# Patient Record
Sex: Female | Born: 1980
Health system: Southern US, Community
[De-identification: ages and names within clinical notes are randomized; demographics above are authoritative.]

## PROBLEM LIST (undated history)

## (undated) DIAGNOSIS — L409 Psoriasis, unspecified: Secondary | ICD-10-CM

## (undated) DIAGNOSIS — Z808 Family history of malignant neoplasm of other organs or systems: Secondary | ICD-10-CM

## (undated) DIAGNOSIS — F329 Major depressive disorder, single episode, unspecified: Secondary | ICD-10-CM

## (undated) DIAGNOSIS — J45909 Unspecified asthma, uncomplicated: Secondary | ICD-10-CM

## (undated) DIAGNOSIS — D649 Anemia, unspecified: Secondary | ICD-10-CM

## (undated) DIAGNOSIS — Z973 Presence of spectacles and contact lenses: Secondary | ICD-10-CM

## (undated) DIAGNOSIS — M5430 Sciatica, unspecified side: Secondary | ICD-10-CM

## (undated) DIAGNOSIS — F909 Attention-deficit hyperactivity disorder, unspecified type: Secondary | ICD-10-CM

## (undated) DIAGNOSIS — Z8041 Family history of malignant neoplasm of ovary: Secondary | ICD-10-CM

## (undated) DIAGNOSIS — Z8669 Personal history of other diseases of the nervous system and sense organs: Secondary | ICD-10-CM

## (undated) DIAGNOSIS — K279 Peptic ulcer, site unspecified, unspecified as acute or chronic, without hemorrhage or perforation: Secondary | ICD-10-CM

## (undated) DIAGNOSIS — C539 Malignant neoplasm of cervix uteri, unspecified: Secondary | ICD-10-CM

## (undated) DIAGNOSIS — E559 Vitamin D deficiency, unspecified: Secondary | ICD-10-CM

## (undated) DIAGNOSIS — Z862 Personal history of diseases of the blood and blood-forming organs and certain disorders involving the immune mechanism: Secondary | ICD-10-CM

## (undated) DIAGNOSIS — Z8711 Personal history of peptic ulcer disease: Secondary | ICD-10-CM

## (undated) DIAGNOSIS — Z800 Family history of malignant neoplasm of digestive organs: Secondary | ICD-10-CM

## (undated) DIAGNOSIS — F32A Depression, unspecified: Secondary | ICD-10-CM

## (undated) DIAGNOSIS — Z8049 Family history of malignant neoplasm of other genital organs: Secondary | ICD-10-CM

## (undated) DIAGNOSIS — Z8042 Family history of malignant neoplasm of prostate: Secondary | ICD-10-CM

## (undated) DIAGNOSIS — Z803 Family history of malignant neoplasm of breast: Secondary | ICD-10-CM

## (undated) DIAGNOSIS — R51 Headache: Secondary | ICD-10-CM

## (undated) HISTORY — DX: Family history of malignant neoplasm of breast: Z80.3

## (undated) HISTORY — DX: Family history of malignant neoplasm of ovary: Z80.41

## (undated) HISTORY — PX: NO PAST SURGERIES: SHX2092

## (undated) HISTORY — DX: Family history of malignant neoplasm of other organs or systems: Z80.8

## (undated) HISTORY — DX: Headache: R51

## (undated) HISTORY — DX: Major depressive disorder, single episode, unspecified: F32.9

## (undated) HISTORY — DX: Depression, unspecified: F32.A

## (undated) HISTORY — DX: Family history of malignant neoplasm of prostate: Z80.42

## (undated) HISTORY — DX: Attention-deficit hyperactivity disorder, unspecified type: F90.9

## (undated) HISTORY — DX: Family history of malignant neoplasm of other genital organs: Z80.49

## (undated) HISTORY — DX: Peptic ulcer, site unspecified, unspecified as acute or chronic, without hemorrhage or perforation: K27.9

## (undated) HISTORY — DX: Anemia, unspecified: D64.9

## (undated) HISTORY — DX: Family history of malignant neoplasm of digestive organs: Z80.0

---
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
## 2001-01-20 ENCOUNTER — Encounter: Payer: Self-pay | Admitting: Obstetrics & Gynecology

## 2001-01-20 ENCOUNTER — Inpatient Hospital Stay (HOSPITAL_COMMUNITY): Admission: AD | Admit: 2001-01-20 | Discharge: 2001-01-20 | Payer: Self-pay | Admitting: Obstetrics & Gynecology

## 2001-02-05 ENCOUNTER — Inpatient Hospital Stay (HOSPITAL_COMMUNITY): Admission: AD | Admit: 2001-02-05 | Discharge: 2001-02-05 | Payer: Self-pay | Admitting: Obstetrics and Gynecology

## 2001-03-01 ENCOUNTER — Other Ambulatory Visit: Admission: RE | Admit: 2001-03-01 | Discharge: 2001-03-01 | Payer: Self-pay | Admitting: Obstetrics and Gynecology

## 2001-05-31 ENCOUNTER — Inpatient Hospital Stay (HOSPITAL_COMMUNITY): Admission: AD | Admit: 2001-05-31 | Discharge: 2001-05-31 | Payer: Self-pay | Admitting: Obstetrics and Gynecology

## 2001-06-15 ENCOUNTER — Inpatient Hospital Stay (HOSPITAL_COMMUNITY): Admission: AD | Admit: 2001-06-15 | Discharge: 2001-06-15 | Payer: Self-pay | Admitting: Obstetrics and Gynecology

## 2001-06-20 ENCOUNTER — Inpatient Hospital Stay (HOSPITAL_COMMUNITY): Admission: AD | Admit: 2001-06-20 | Discharge: 2001-06-20 | Payer: Self-pay | Admitting: Obstetrics and Gynecology

## 2001-08-02 ENCOUNTER — Inpatient Hospital Stay (HOSPITAL_COMMUNITY): Admission: AD | Admit: 2001-08-02 | Discharge: 2001-08-02 | Payer: Self-pay | Admitting: Obstetrics and Gynecology

## 2001-08-30 ENCOUNTER — Observation Stay (HOSPITAL_COMMUNITY): Admission: AD | Admit: 2001-08-30 | Discharge: 2001-08-30 | Payer: Self-pay | Admitting: Obstetrics and Gynecology

## 2001-08-30 ENCOUNTER — Encounter: Payer: Self-pay | Admitting: Obstetrics and Gynecology

## 2001-09-05 ENCOUNTER — Inpatient Hospital Stay (HOSPITAL_COMMUNITY): Admission: AD | Admit: 2001-09-05 | Discharge: 2001-09-07 | Payer: Self-pay | Admitting: Obstetrics and Gynecology

## 2003-05-09 ENCOUNTER — Other Ambulatory Visit: Admission: RE | Admit: 2003-05-09 | Discharge: 2003-05-09 | Payer: Self-pay | Admitting: Obstetrics and Gynecology

## 2003-11-02 ENCOUNTER — Emergency Department (HOSPITAL_COMMUNITY): Admission: EM | Admit: 2003-11-02 | Discharge: 2003-11-02 | Payer: Self-pay | Admitting: Family Medicine

## 2004-03-20 ENCOUNTER — Other Ambulatory Visit: Admission: RE | Admit: 2004-03-20 | Discharge: 2004-03-20 | Payer: Self-pay | Admitting: Emergency Medicine

## 2004-09-01 ENCOUNTER — Encounter: Admission: RE | Admit: 2004-09-01 | Discharge: 2004-09-01 | Payer: Self-pay | Admitting: Emergency Medicine

## 2004-09-06 ENCOUNTER — Emergency Department (HOSPITAL_COMMUNITY): Admission: EM | Admit: 2004-09-06 | Discharge: 2004-09-06 | Payer: Self-pay | Admitting: Emergency Medicine

## 2004-09-12 ENCOUNTER — Encounter: Admission: RE | Admit: 2004-09-12 | Discharge: 2004-09-12 | Payer: Self-pay | Admitting: Emergency Medicine

## 2005-10-16 ENCOUNTER — Encounter: Admission: RE | Admit: 2005-10-16 | Discharge: 2005-10-16 | Payer: Self-pay | Admitting: Gastroenterology

## 2005-11-15 ENCOUNTER — Encounter: Admission: RE | Admit: 2005-11-15 | Discharge: 2005-11-15 | Payer: Self-pay | Admitting: Emergency Medicine

## 2006-05-07 ENCOUNTER — Encounter: Admission: RE | Admit: 2006-05-07 | Discharge: 2006-05-07 | Payer: Self-pay | Admitting: Family Medicine

## 2006-10-26 ENCOUNTER — Ambulatory Visit: Payer: Self-pay | Admitting: Family Medicine

## 2006-10-26 DIAGNOSIS — D649 Anemia, unspecified: Secondary | ICD-10-CM | POA: Insufficient documentation

## 2006-10-26 DIAGNOSIS — F329 Major depressive disorder, single episode, unspecified: Secondary | ICD-10-CM

## 2006-10-26 DIAGNOSIS — R51 Headache: Secondary | ICD-10-CM

## 2006-10-26 DIAGNOSIS — K1370 Unspecified lesions of oral mucosa: Secondary | ICD-10-CM

## 2006-10-26 DIAGNOSIS — K279 Peptic ulcer, site unspecified, unspecified as acute or chronic, without hemorrhage or perforation: Secondary | ICD-10-CM | POA: Insufficient documentation

## 2006-10-26 DIAGNOSIS — J45909 Unspecified asthma, uncomplicated: Secondary | ICD-10-CM | POA: Insufficient documentation

## 2006-10-26 DIAGNOSIS — F32A Depression, unspecified: Secondary | ICD-10-CM | POA: Insufficient documentation

## 2006-10-28 ENCOUNTER — Encounter: Payer: Self-pay | Admitting: Family Medicine

## 2006-11-04 ENCOUNTER — Emergency Department (HOSPITAL_COMMUNITY): Admission: EM | Admit: 2006-11-04 | Discharge: 2006-11-04 | Payer: Self-pay | Admitting: Emergency Medicine

## 2006-12-24 ENCOUNTER — Ambulatory Visit: Payer: Self-pay | Admitting: Family Medicine

## 2006-12-24 DIAGNOSIS — G4700 Insomnia, unspecified: Secondary | ICD-10-CM | POA: Insufficient documentation

## 2006-12-24 DIAGNOSIS — N915 Oligomenorrhea, unspecified: Secondary | ICD-10-CM

## 2006-12-24 LAB — CONVERTED CEMR LAB: Beta hcg, urine, semiquantitative: NEGATIVE

## 2007-01-21 ENCOUNTER — Telehealth: Payer: Self-pay | Admitting: Family Medicine

## 2007-03-04 ENCOUNTER — Ambulatory Visit: Payer: Self-pay | Admitting: Family Medicine

## 2007-03-04 DIAGNOSIS — L989 Disorder of the skin and subcutaneous tissue, unspecified: Secondary | ICD-10-CM

## 2007-03-23 ENCOUNTER — Telehealth (INDEPENDENT_AMBULATORY_CARE_PROVIDER_SITE_OTHER): Payer: Self-pay | Admitting: *Deleted

## 2007-03-24 ENCOUNTER — Telehealth (INDEPENDENT_AMBULATORY_CARE_PROVIDER_SITE_OTHER): Payer: Self-pay | Admitting: *Deleted

## 2007-09-27 ENCOUNTER — Telehealth: Payer: Self-pay | Admitting: Family Medicine

## 2007-10-28 ENCOUNTER — Ambulatory Visit: Payer: Self-pay | Admitting: Family Medicine

## 2007-10-28 DIAGNOSIS — B070 Plantar wart: Secondary | ICD-10-CM | POA: Insufficient documentation

## 2007-11-01 ENCOUNTER — Inpatient Hospital Stay (HOSPITAL_COMMUNITY): Admission: AD | Admit: 2007-11-01 | Discharge: 2007-11-02 | Payer: Self-pay | Admitting: Obstetrics and Gynecology

## 2007-12-17 ENCOUNTER — Inpatient Hospital Stay (HOSPITAL_COMMUNITY): Admission: AD | Admit: 2007-12-17 | Discharge: 2007-12-19 | Payer: Self-pay | Admitting: Obstetrics and Gynecology

## 2008-03-06 IMAGING — CR DG SMALL BOWEL
4 series · 4 of 4 positions shown · non-contrast
Comparison: none

CLINICAL DATA: Nausea, diarrhea

SMALL BOWEL SERIES:
TECHNIQUE: Following ingestion of barium, serial small bowel radiographs were
obtained, including spot views of the terminal ileum.

[view not recorded (1 of 4)]
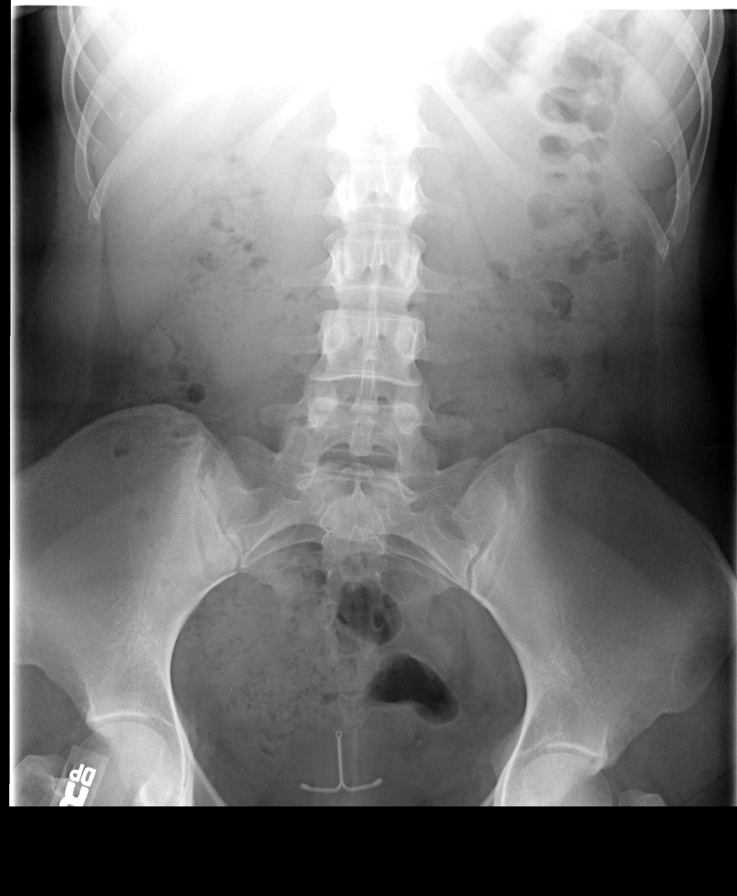

[view not recorded (2 of 4)]
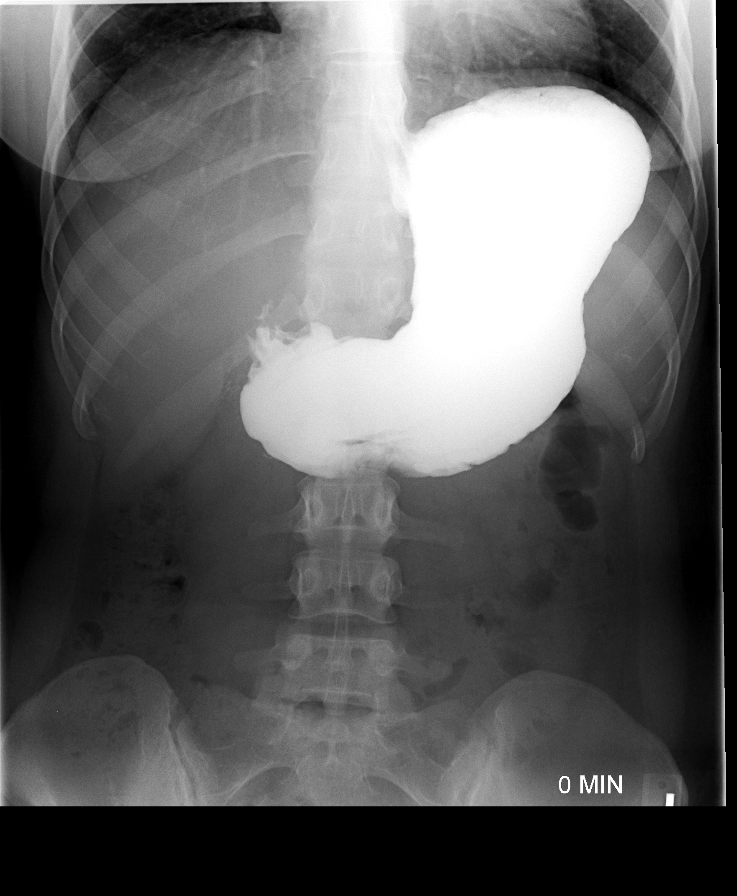

[view not recorded (3 of 4)]
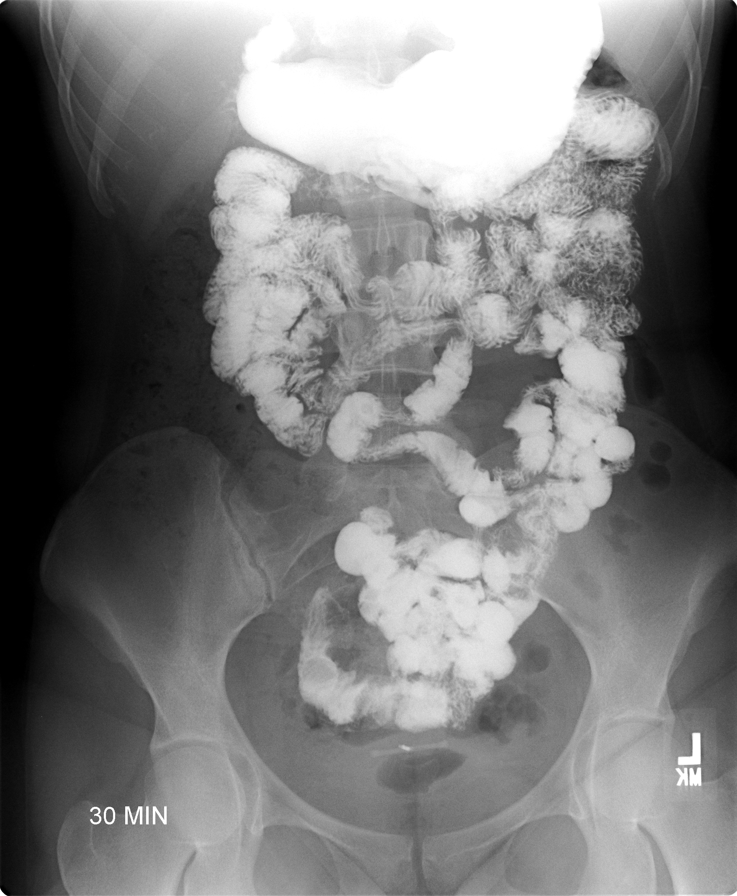

[view not recorded (4 of 4)]
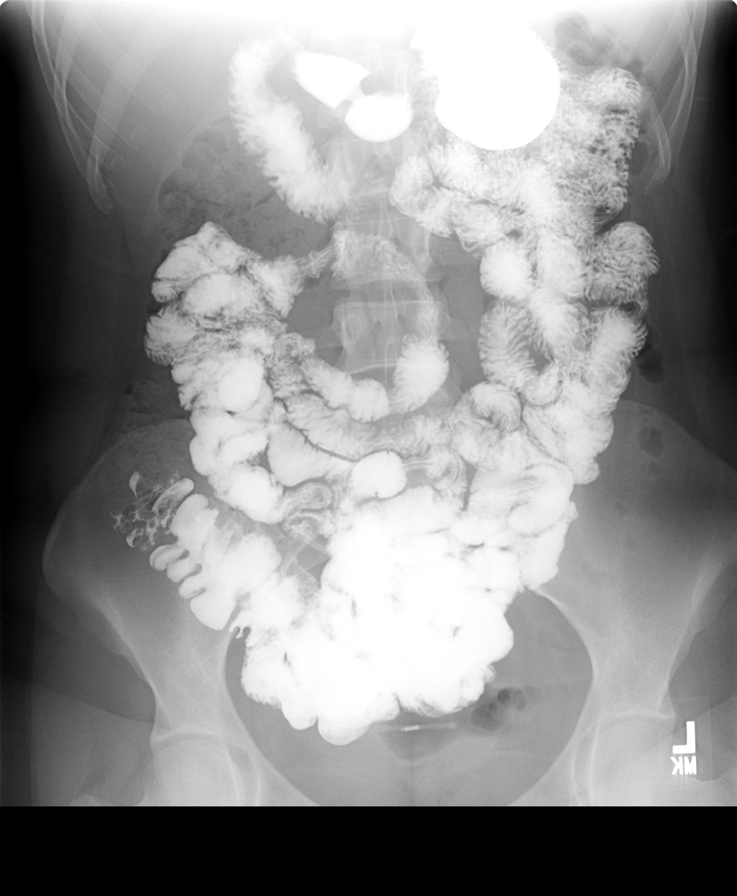

[4 of 4 positions shown; findings below may reference images not displayed]

FINDINGS: Images of the stomach, duodenal bulb, and duodenal sweep are
unremarkable. There is normal transit time to the colon of 1 hour. Small bowel
loops are normal caliber and unremarkable. Terminal ileum normal.
IMPRESSION: Unremarkable small bowel follow-through.

## 2008-04-25 ENCOUNTER — Ambulatory Visit: Payer: Self-pay | Admitting: Family Medicine

## 2008-04-25 DIAGNOSIS — R071 Chest pain on breathing: Secondary | ICD-10-CM | POA: Insufficient documentation

## 2008-04-25 LAB — CONVERTED CEMR LAB
Bilirubin Urine: NEGATIVE
Blood in Urine, dipstick: NEGATIVE
Ketones, urine, test strip: NEGATIVE
Specific Gravity, Urine: 1.02

## 2008-05-10 ENCOUNTER — Telehealth: Payer: Self-pay | Admitting: Family Medicine

## 2008-05-16 ENCOUNTER — Encounter: Payer: Self-pay | Admitting: Family Medicine

## 2008-11-21 ENCOUNTER — Ambulatory Visit: Payer: Self-pay | Admitting: Family Medicine

## 2008-11-21 DIAGNOSIS — R002 Palpitations: Secondary | ICD-10-CM

## 2008-11-23 LAB — CONVERTED CEMR LAB
BUN: 16 mg/dL (ref 6–23)
Creatinine, Ser: 0.7 mg/dL (ref 0.4–1.2)
GFR calc non Af Amer: 106.29 mL/min (ref >60)
Sodium: 141 meq/L (ref 135–145)
TSH: 1.66 microintl units/mL (ref 0.35–5.50)

## 2009-04-16 ENCOUNTER — Ambulatory Visit: Payer: Self-pay | Admitting: Family Medicine

## 2009-04-16 DIAGNOSIS — G43909 Migraine, unspecified, not intractable, without status migrainosus: Secondary | ICD-10-CM | POA: Insufficient documentation

## 2009-04-20 ENCOUNTER — Emergency Department (HOSPITAL_BASED_OUTPATIENT_CLINIC_OR_DEPARTMENT_OTHER): Admission: EM | Admit: 2009-04-20 | Discharge: 2009-04-20 | Payer: Self-pay | Admitting: Emergency Medicine

## 2009-05-01 ENCOUNTER — Ambulatory Visit: Payer: Self-pay | Admitting: Family Medicine

## 2010-02-21 ENCOUNTER — Telehealth: Payer: Self-pay | Admitting: Family Medicine

## 2010-03-23 ENCOUNTER — Encounter: Payer: Self-pay | Admitting: Emergency Medicine

## 2010-04-01 NOTE — Assessment & Plan Note (Signed)
Summary: fu on migraines/new rx/njr   Vital Signs:  Patient profile:   30 year old female Height:      63 inches Weight:      179 pounds BMI:     31.82 Temp:     98.3 degrees F oral BP sitting:   122 / 66  (left arm) Cuff size:   regular  Vitals Entered By: Alfred Levins, CMA (April 16, 2009 10:12 AM) CC: migraine   History of Present Illness: here for 3 days of worsening migraine HAs. Has tried Imitrex and Motrin at home with no relief. Her husband drove her here today. She has blurred vision, throbbing frontal HAs, nausea and vomitting, and light sensitivity. She had used Topamax with great success a few years ago, but came off this with her pregnancy. The HAs went away for awhile but now they are back with increasing frequency.   Current Medications (verified): 1)  Ambien Cr 12.5 Mg Tbcr (Zolpidem Tartrate) .Marland Kitchen.. 1 By Mouth At Bedtime As Needed 2)  Imitrex 100 Mg  Tabs (Sumatriptan Succinate) .... As Needed Ha 3)  Womens One Daily  Tabs (Multiple Vitamins-Minerals) .... Once Daily 4)  Fish Oil 1000 Mg Caps (Omega-3 Fatty Acids) .... Once Daily  Allergies (verified): No Known Drug Allergies  Past History:  Past Medical History: Reviewed history from 10/26/2006 and no changes required. Anemia-NOS Asthma Depression Headache Peptic ulcer disease  Past Surgical History: Reviewed history from 10/26/2006 and no changes required. Denies surgical history  Review of Systems  The patient denies anorexia, fever, weight loss, weight gain, vision loss, decreased hearing, hoarseness, chest pain, syncope, dyspnea on exertion, peripheral edema, prolonged cough, hemoptysis, abdominal pain, melena, hematochezia, severe indigestion/heartburn, hematuria, incontinence, genital sores, muscle weakness, suspicious skin lesions, transient blindness, difficulty walking, depression, unusual weight change, abnormal bleeding, enlarged lymph nodes, angioedema, breast masses, and testicular masses.     Physical Exam  General:  Well-developed,well-nourished,in no acute distress; alert,appropriate and cooperative throughout examination Head:  Normocephalic and atraumatic without obvious abnormalities. No apparent alopecia or balding. Eyes:  No corneal or conjunctival inflammation noted. EOMI. Perrla. Funduscopic exam benign, without hemorrhages, exudates or papilledema. Vision grossly normal. Very photophobic Ears:  External ear exam shows no significant lesions or deformities.  Otoscopic examination reveals clear canals, tympanic membranes are intact bilaterally without bulging, retraction, inflammation or discharge. Hearing is grossly normal bilaterally. Nose:  External nasal examination shows no deformity or inflammation. Nasal mucosa are pink and moist without lesions or exudates. Mouth:  Oral mucosa and oropharynx without lesions or exudates.  Teeth in good repair. Neck:  No deformities, masses, or tenderness noted. Neurologic:  No cranial nerve deficits noted. Station and gait are normal. Plantar reflexes are down-going bilaterally. DTRs are symmetrical throughout. Sensory, motor and coordinative functions appear intact.   Impression & Recommendations:  Problem # 1:  MIGRAINE HEADACHE (ICD-346.90)  Her updated medication list for this problem includes:    Imitrex 100 Mg Tabs (Sumatriptan succinate) .Marland Kitchen... As needed ha  Orders: Promethazine up to 50mg  (J2550) Admin of Therapeutic Inj  intramuscular or subcutaneous (64403) Demerol  100mg   Injection (K7425)  Complete Medication List: 1)  Ambien Cr 12.5 Mg Tbcr (Zolpidem tartrate) .Marland Kitchen.. 1 by mouth at bedtime as needed 2)  Imitrex 100 Mg Tabs (Sumatriptan succinate) .... As needed ha 3)  Womens One Daily Tabs (Multiple vitamins-minerals) .... Once daily 4)  Fish Oil 1000 Mg Caps (Omega-3 fatty acids) .... Once daily 5)  Promethazine Hcl 25 Mg Tabs (  Promethazine hcl) .Marland Kitchen.. 1 q 4 hours as needed nausea 6)  Topamax 50 Mg Tabs  (Topiramate) .... Once daily  Patient Instructions: 1)  Get back on Topamax for prevention.  2)  Please schedule a follow-up appointment in 2 weeks.  Prescriptions: TOPAMAX 50 MG TABS (TOPIRAMATE) once daily  #30 x 2   Entered and Authorized by:   Nelwyn Salisbury MD   Signed by:   Nelwyn Salisbury MD on 04/16/2009   Method used:   [facility]  [place] [address] [facility]       [address]       [place], [place]  [address]       Ph: [phone] or [phone]       Fax: [phone]   RxID:   [id number] PROMETHAZINE HCL 25 MG TABS (PROMETHAZINE HCL) 1 q 4 hours as needed nausea  #60 x 5   Entered and Authorized by:   Nelwyn Salisbury MD   Signed by:   Nelwyn Salisbury MD on 04/16/2009   Method used:   [facility]        [facility]  [place] [address] [facility]       [address]       [place], [place]  [address]       Ph: [phone] or [phone]       Fax: [phone]   RxID:   [id number] IMITREX 100 MG  TABS (SUMATRIPTAN SUCCINATE) as needed HA  #12 x 11   Entered and Authorized by:   Nelwyn Salisbury MD   Signed by:   Nelwyn Salisbury MD on 04/16/2009   Method used:   [facility]  [place] [address]       [address]       [place], [place]  [address]       Ph: [phone] or [phone]       Fax: [phone]   RxID:   [id number]    Medication Administration  Injection # 1:    Medication: Promethazine up to 50mg     Diagnosis: MIGRAINE HEADACHE (ICD-346.90)    Route: IM    Site: LUOQ gluteus    Exp Date: 07/2010    Lot #: [id number]    Mfr: [employer]plus    Patient tolerated injection without complications    Given by: Alfred Levins, CMA (April 16, 2009 12:50 PM)  Injection # 2:    Medication: Demerol  100mg   Injection    Diagnosis: MIGRAINE HEADACHE (ICD-346.90)    Route: IM    Site: LUOQ gluteus    Exp Date: 05/01/2010    Lot #: [id number]    Mfr: [employer]    Comments: 50mg  given    Patient tolerated injection without  complications    Given by: Alfred Levins, CMA (April 17, 2009 10:28 AM)  Orders Added: 1)  Est. Patient Level IV [52778] 2)  Promethazine up to 50mg  [J2550] 3)  Admin of Therapeutic Inj  intramuscular or subcutaneous [96372] 4)  Demerol  100mg   Injection [J2175]

## 2010-04-03 NOTE — Progress Notes (Signed)
Summary: Pt req refill of Ambien CR [facility] [place]  Phone Note Refill Request Call back at Home Phone [phone] Message from:  Patient on February 21, 2010 10:11 AM  Refills Requested: Medication #1:  AMBIEN CR 12.5 MG TBCR 1 by mouth at bedtime as needed   Dosage confirmed as above?Dosage Confirmed   Supply Requested: 1 month Pt req 1 month supply with refills. [facility] [address] [facility] [place]. Pt completely out of med.    Method Requested: Telephone to Pharmacy Initial call taken by: Lucy Antigua,  February 21, 2010 10:11 AM  Follow-up for Phone Call        ok #30. further refills from pmd Follow-up by: Edwyna Perfect MD,  February 21, 2010 10:45 AM  Additional Follow-up for Phone Call Additional follow up Details #1::        done pt called  Additional Follow-up by: Pura Spice, RN,  February 21, 2010 10:50 AM    New/Updated Medications: AMBIEN CR 12.5 MG TBCR (ZOLPIDEM TARTRATE) 1 by mouth at bedtime as needed Prescriptions: AMBIEN CR 12.5 MG TBCR (ZOLPIDEM TARTRATE) 1 by mouth at bedtime as needed  #30 x 0   Entered by:   Pura Spice, RN   Authorized by:   Edwyna Perfect MD   Signed by:   Pura Spice, RN on 02/21/2010   Method used:   Telephoned to ...       [facility]  [place] [address] ([facility]ail[facility]       [address]       [place], [place]  [address]       Ph: [phone] or [phone]       Fax: [phone]   RxID:   [id number]

## 2010-04-10 ENCOUNTER — Encounter: Payer: Self-pay | Admitting: Family Medicine

## 2010-04-15 ENCOUNTER — Ambulatory Visit: Payer: Self-pay | Admitting: Family Medicine

## 2010-04-15 DIAGNOSIS — Z0289 Encounter for other administrative examinations: Secondary | ICD-10-CM

## 2010-05-22 LAB — CBC
HCT: 40.1 % (ref 36.0–46.0)
MCHC: 34.5 g/dL (ref 30.0–36.0)
MCV: 93.2 fL (ref 78.0–100.0)
Platelets: 141 10*3/uL — ABNORMAL LOW (ref 150–400)
RBC: 4.3 MIL/uL (ref 3.87–5.11)
RDW: 13 % (ref 11.5–15.5)
WBC: 6.1 10*3/uL (ref 4.0–10.5)

## 2010-05-22 LAB — BASIC METABOLIC PANEL
BUN: 14 mg/dL (ref 6–23)
Calcium: 9 mg/dL (ref 8.4–10.5)
GFR calc non Af Amer: >60 mL/min (ref >60)

## 2010-05-22 LAB — DIFFERENTIAL
Basophils Absolute: 0 10*3/uL (ref 0.0–0.1)
Lymphs Abs: 0.6 10*3/uL — ABNORMAL LOW (ref 0.7–4.0)
Monocytes Absolute: 0.6 10*3/uL (ref 0.1–1.0)
Monocytes Relative: 9 % (ref 3–12)
Neutrophils Relative %: 79 % — ABNORMAL HIGH (ref 43–77)

## 2010-07-18 NOTE — H&P (Signed)
[facility]  Patient:    Alison Cervantes, Alison Cervantes Visit Number: [id number] MRN: [id number]          Service Type: OBS Location: 910B 9160 01 Attending Physician:  Esmeralda Arthur Dictated by:   Wynelle Bourgeois, CNM Admit Date:  09/05/2001                           History and Physical  BRIEF HISTORY:  This is a 30 year old G2, para 1, 0-0-1 at 41 weeks who presents with complaints of increased uterine contractions since 8 a.m. with leaking clear fluids since then.  She reports positive fetal movement.  The pregnancy has followed by the nurse midwife service unremarkable for: 1. Abnormal LMP. 2. Rh-negative. 3. History of depression. 4. Positive group B streptococcus last pregnancy, negative this pregnancy.  OBSTETRICAL HISTORY:  Remarkable for spontaneous vaginal delivery in October of 2000, of a female infant at [redacted] weeks gestation weighing 7 pounds 14 ounces with no complications.  PAST MEDICAL HISTORY: 1. Remarkable for a history of anemia treated with iron. 2. Occasional yeast infections 3. History of Rh-negative blood type. 4. History of positive group B streptococcus last pregnancy. 5. A history of depression for which she uses Prozac. 6. A history of pyelonephritis x2.  PAST SURGICAL HISTORY:  Unremarkable.  FAMILY HISTORY:  Remarkable for maternal grandmother with emphysema.  Paternal grandfather with adult onset diabetes mellitus and a maternal grandmother with adult onset diabetes mellitus.  Maternal grandmother with lung cancer, paternal grandfather with prostate cancer.  Mother with manic depression.  GENETIC HISTORY:  Remarkable for the father of the babys sister who has congenital heart disease, probably a septal defect. The same sister also has deafness in 1 ear with a speech impairment.  SOCIAL HISTORY:  The patient is single but involved with Rosalia Hammers who is involved and supportive. She is of the [facility].  She  denies any alcohol, tobacco or drug use.  PRENATAL LABORATORY DATA:  Hemoglobin 12.4, platelets 168, blood type AB negative, antibody screen negative.  RPR nonreactive.  Rubella immune. HBsAg negative, HIV nonreactive.  Gonorrhea negative.  Chlamydia negative.  Group B streptococcus negative.  AFP free beta within normal limits.  Glucose challenge within normal limits.  PHYSICAL EXAMINATION:  VITAL SIGNS:  Stable, afebrile.  HEENT:  Within normal limits.  NECK:  Thyroid normal.  Not enlarged.  BREASTS:  Soft, nontender.  No masses.  CHEST:  Clear to auscultation bilaterally.  HEART:  Heart regular rate and rhythm.  ABDOMEN:  Gravid at 40 cm.  Vertex to [place].  VFM shows reassuring fetal heart rate with positive accelerations, average variability, and occasional small variable decelerations with contractions.  Uterine contractions every 1-1/2 to 2 minutes.  Cervical examination 4-5 cm, 90% effaced, 0 station, vertex presentation.  Bulging forebag.  Examination positive for pooling and nitrocine and clear fluid.  EXTREMITIES:  Within normal limits.  ASSESSMENT: 1. Intrauterine pregnancy at 42 and seventh weeks. 2. Active labor. 3. Spontaneous rupture of membranes.  PLAN: 1. Admit to birthing suite, Dr. Estanislado Pandy notified. 2. Routine CNM orders. 3. Epidural p.r.n. 4. Anticipate spontaneous vaginal delivery. Dictated by:   Wynelle Bourgeois, CNM Attending Physician:  Esmeralda Arthur DD:  09/05/01 TD:  09/05/01 Job: [id number]

## 2010-07-18 NOTE — H&P (Signed)
[facility]  Patient:    Alison Cervantes, Alison Cervantes Visit Number: [id number] MRN: [id number]          Service Type: OBS Location: 910B 9151 01 Attending Physician:  Shaune Spittle Dictated by:   Nigel Bridgeman, C.N.M. Admit Date:  08/30/2001                           History and Physical  HISTORY OF PRESENT ILLNESS:   The patient is a 30 year old gravida 2, para 1-0-0-1 at 12 weeks, who presented status post maternal fall at approximately 7:50 a.m. this morning with fall on her abdomen, left elbow and knee; she also struck her forehead on the wall as she fell and slipped on carpet running for the telephone.  She denies any vaginal bleeding and leaking.  She reports positive fetal movements.  She does describe occasional cramping but she reports this is no different from what she was having before the fall occurred.  She does complain of slight dysuria but denies any fever or back pain.  Pregnancy is remarkable for:  #1 - Rh-negative, with the patient receiving RhoGAM at 28 weeks, #2 - history of depression, #3 - questionable LMP, #4 - history of mild thrombocytopenia with platelets of 145,000 on August 09, 2001, with a nadir of 129,000 in April.  PRENATAL LABORATORY DATA:     Blood type is AB-negative, Rh-antibody negative. VDRL nonreactive.  Rubella titer positive.  Hepatitis B surface antigen negative.  HIV nonreactive.  GC and Chlamydia cultures were negative.  Pap was normal.  Glucose challenge was normal.  AFP was normal.  Hemoglobin upon entry into practice was 12.4; it was 11 at 27 weeks.  Group B strep culture was negative at 36 weeks.  Platelet count at new OB was 168,000; it was 138,000 at 19 weeks, it was 129,000 at 28 weeks and 145,000 at 36 weeks.  EDC of September 04, 2001 was established by ultrasound at approximately 19 weeks.  HISTORY OF PRESENT PREGNANCY:  The patient entered care at approximately 11-1/2 weeks.  She had an ultrasound at  19 weeks for dating purposes.  Her platelet count was noted to be slightly low when she was evaluated in Maternity Admissions at 23 weeks.  These were repeated; they went down to a nadir of 129,000 at 28 weeks and then returned to 145,000 at 36 weeks.  She had another ultrasound at 29 weeks that showed normal growth and development. There was left renal pyelectasis noted.  She had a GI virus at 35 weeks.  The rest of her pregnancy was essentially uncomplicated.  OBSTETRICAL HISTORY:          She had vaginal birth of a female infant, weight 7 pounds 14 ounces, at [redacted] weeks gestation.  She was in labor 20 hours.  She had epidural anesthesia.  She was on extra iron during that pregnancy.  She also received RhoGAM and was positive for beta strep.  MEDICAL HISTORY:              She was on Ortho Tri-Cyclen until six months prior to pregnancy; she has also used Depo-Provera.  She has had UTIs three to four times.  She has had pyelonephritis twice.  She has a history of depression.  Hospitalizations include kidney infection in the past.  She was hospitalized at [facility] for three to four months when she was 30 years old for depression; she has taken  Prozac in the past.  ALLERGIES:                    She has no known medication allergies.  FAMILY HISTORY:               Her maternal grandmother had emphysema, insulin-dependent diabetes and lung cancer.  Her paternal grandfather had adult-onset diabetes and prostate cancer.  Her mother has manic depression. Her maternal grandfather also had adult-onset diabetes.  Her father is an alcohol user.  GENETIC HISTORY:              Genetic history is remarkable for the father of the babys sister having a hole in the heart, being deaf in one ear and having a speech impediment.  SOCIAL HISTORY:               The patient is single.  The father of the baby is involved and supportive; his name is Rosalia Hammers.  The patient  is high-school-educated; she is employed with [employer] as a Psychologist, educational.  Her husband has two years of college; he is employed as a Nutritional therapist.  She has been followed by the certified nurse midwife service at [facility].  She denies any alcohol, drug or tobacco use during this pregnancy.  PHYSICAL EXAMINATION:  VITAL SIGNS:                  Vital signs are stable.  The patient is afebrile.  HEENT:                        Remarkable for a small area of abrasion on her mid-forehead; there is also one small area -- approximately grape-sized -- of swelling where she struck her head.  LUNGS:                        Bilateral breath sounds are clear.  HEART:                        Regular rate and rhythm without murmur.  BREASTS:                      Soft and nontender.  ABDOMEN:                      Fundal height is approximately 37 to 38 cm. Estimated fetal weight is 7 to 7-1/2 pounds.  Uterine contractions are very irregular and mild.  PELVIC:                       Cervical exam is deferred; the patient has been 2 cm in the office.   EXTREMITIES:                  Deep tendon reflexes are 2+ without clonus. There is a trace edema noted.  There is a nickel-sized abrasion on the left elbow and a 2- to 3-cm abrasion on her left knee.  LABORATORY AND ACCESSORY DATA:  Fetal heart rate is reactive and has remained such over the last several hours.  CBC is within normal limits with hemoglobin 11.3, hematocrit of 33.7, platelet count of 143,000 and white blood cell count of 9.6.  Kleihauer-Betke is negative.  Clean-catch urine shows specific gravity of 1.025, moderate leukocyte esterase, many epithelials, 11-20 wbcs and many bacteria.  IMPRESSION: 1. Intrauterine pregnancy  at 39 weeks. 2. Status post maternal abdominal trauma. 3. Rh-negative. 4. Urinary tract infection.  PLAN: 1. Admit to the hospital for 23-hour observation with a consideration of    possible discharge later  this evening per consult with    Dr. Maris Berger. [place]. 2. Continuous electronic fetal monitoring.  3. OB ultrasound to check the placenta and BPP. 4. Urine to culture. 5. Will prescribe Macrobid or formulary equivalent. 6. Check antibody screen with RhoGAM to be given if antibody screen is    negative but no RhoGAM is needed if antibody screen is positive. Dictated by:   Nigel Bridgeman, C.N.M. Attending Physician:  Shaune Spittle DD:  08/30/01 TD:  08/30/01 Job: [id number]

## 2010-07-18 NOTE — Discharge Summary (Signed)
[facility]  Patient:    Alison Cervantes, Alison Cervantes Visit Number: [id number] MRN: [id number]          Service Type: OBS Location: 910B 9151 01 Attending Physician:  Shaune Spittle Dictated by:   Nigel Bridgeman, C.N.M. Admit Date:  08/30/2001 Discharge Date: 08/30/2001                             Discharge Summary  ADMITTING DIAGNOSES: 1. Intrauterine pregnancy at 39 weeks. 2. Status post maternal abdominal trauma. 3. Rh-negative. 4. Urinary tract infection.  DISCHARGE DIAGNOSES: 1. Intrauterine pregnancy at 39 weeks. 2. Status post maternal abdominal trauma. 3. Rh-negative. 4. Urinary tract infection.  PROCEDURES: 1. Continuous electronic fetal monitoring. 2. Obstetrical ultrasound.  HOSPITAL COURSE:              The patient is a 30 year old gravida 2, para 1-0-0-1, at 32 weeks, who was evaluated today, status post a maternal fall. Pregnancy had been remarkable for:  #1 - Rh-negative, #2 - history of depression, #3 - questionable LMP, #4 - history of mild thrombocytopenia.  She was monitored for almost 10 hours; she had a reactive fetal heart rate tracing throughout.  She had very occasional mild contractions which she reported were no different than what she was having prior to the fall.  She had a limited OB ultrasound that showed the fetus in a vertex presentation with normal fluid and a biophysical profile of 8/8.  She had a CBC done that showed a hemoglobin of 11.3, hematocrit of 33.7, platelet count of 143,000, which was stable from her last evaluation on August 09, 2001.  Her Kleihauer-Betke was negative. Clean-catch UA showed specific gravity of 1.025, moderate leukocyte esterase, many epithelials, 11-20 wbcs and many bacteria; this was sent for culture. The patient was given one dose of Macrobid.  She also had an antibody screen that was negative, therefore, she was given a full dose of RhoGAM to cover. She had a small abrasion on  her left elbow and a moderate abrasion on her left knee; she also had a small swollen area on her forehead with two small skin breaks there.  She had a normal neurological exam.  Her abdomen was soft and nontender.  After the prolonged monitoring, ultrasound, RhoGAM administration and other labs, she was deemed to be stable and was able to be discharged home per consult with Dr. Maris Berger. Haygood.  Cervix was posterior, 2 cm, 75%, vertex at a -1 station, which represented no change from her previous exam in the office; the patient was therefore discharged home.  DISCHARGE INSTRUCTIONS:       The patient is to observe for signs and symptoms of labor.  Precautions, status post maternal abdominal trauma, were also reviewed with the patient; instructed to call with any bleeding, severe abdominal pain or decrease in fetal movement.  DISCHARGE MEDICATIONS: 1. Tylenol one to two p.o. q.3-4h. p.r.n. pain. 2. Macrobid one p.o. b.i.d. -- this will be called into the pharmacy tomorrow    for a seven-day course.  FOLLOWUP:                     Discharge followup will occur on Thursday, on September 01, 2001, as scheduled.  SPECIAL DISCHARGE INSTRUCTIONS:  She will not need RhoGAM at the time of her delivery secondary to receiving a full dose on todays date. Dictated by:   Nigel Bridgeman, C.N.M. Attending  Physician:  Shaune Spittle DD:  08/30/01 TD:  09/01/01 Job: [id number]

## 2010-07-18 NOTE — H&P (Signed)
[facility]  Patient:    Alison, Cervantes Visit Number: [id number] MRN: [id number]          Service Type: OBS Location: [id number] Attending Physician:  Shaune Spittle Dictated by:   Nigel Bridgeman, C.N.M. Admit Date:  08/30/2001                           History and Physical  INCOMPLETE  HISTORY OF PRESENT ILLNESS:   Ms. Alison Cervantes is a 30 year old gravida 2, para 1-0-0-1, at 90 weeks, who presented status post a paternal fall this morning at approximately 7:50 a.m. with a fall on her abdomen, left elbow, and knee. She also struck her forehead on the wall.  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  She denies any vaginal bleeding or leaking.  She reports positive fetal movement.  She describes occasional cramping but reports this is no different than before the fall.  She does complain of slight dysuria. Pregnancy is remarkable for:  (1) Rh negative with patient receiving RhoGAM at approximately 28 weeks.  (2) History of depression.  (3) Questionable last menstrual period.  (4) History of mild thrombocytopenia with platelets of 145 on August 09, 2001.  PRENATAL LABORATORY DATA:     Blood type is AB negative, Rh antibody negative. VDRL nonreactive.  Rubella titer positive.  Hepatitis B surface antigen negative.  HIV nonreactive.  GC and Chlamydia cultures were negative.  Pap was normal.  Glucose challenge was normal.  AFP was normal.  The patient had a negative beta strep report.  Platelets at her initial new OB visit in December were 168.  They were repeated in April, they were 129.  On August 09, 2001, they were 145.  EDC of September 04, 2001, was established by ultrasound at approximately 18-19 weeks.  She had another ultrasound at 29 weeks that showed normal growth and fluid.  Group B strep culture was negative at 36 weeks.  Hemoglobin upon entering the practice was 12.4.  It was 11 at 27 weeks.  HISTORY OF PRESENT PREGNANCY:  The patient  entered care at approximately 11-1/2 weeks.  She had an upper respiratory infection in the early part of pregnancy.  She had an ultrasound at 19 weeks.  Her AFP was normal.  Her platelets were 168 at her new OB.  They were 138 in April.  They were 129 later that month, and they were 141 at 33 weeks, 145 at 36 weeks.  OBSTETRICAL HISTORY:          In 2000 the patient had a vaginal birth of a female infant, weight 7 pounds 14 ounces at [redacted] weeks gestation.  She was in labor 20 hours.  She had epidural anesthesia.  She had no other complications. She was on extra iron during her pregnancy.  She is AB negative and did receive RhoGAM.  She did have positive beta strep in her last pregnancy.  PAST MEDICAL HISTORY:         She was an Ortho Tri-Cyclen user until six months prior to pregnancy.  She was also a paropsis Depo-Provera user.  She had a history of UTI in the past.  She has had pyelonephritis twice.  She had a history of depression, history of previously-noted kidney infection, which required hospitalization.  She was hospitalized at [facility] in three to four months _____ for depression.  She was on Prozac since that time and was  on it just prior to pregnancy.  ALLERGIES:                    She has no known medication allergies.  FAMILY HISTORY:               Her maternal grandmother had emphysema and is now deceased.  Her paternal grandfather has adult onset diabetes.  Paternal grandmother had insulin-dependent diabetes and is now deceased.  The maternal grandmother had lung cancer.  The maternal grandfather had prostate and colon cancer.  Her mother has a history of manic depression, and her father is an alcohol user.  GENETIC HISTORY:              Remarkable for the father of the babys sister having a hole in the heart, deaf in one ear, and a speech impediment.  SOCIAL HISTORY:               The patient is single.  The father of the baby is involved and supportive.  His  name is Claudina Lick.  The patient is white.  She is of the [employer].  She has a high school education.  She is employed as an Estate manager/land agent at [employer].  The patients partner has two years of trade school.  He is employed as a Nutritional therapist.  She has been followed by the certified nurse midwife service at [facility]. She denies any alcohol, drug, or tobacco use during this pregnancy.  PHYSICAL EXAMINATION:  VITAL SIGNS:                  Stable.  The patient is afebrile.  HEENT:                        Within normal limits.  The patient does have a small swollen area approximately the size of a grape on her right forehead. She also has two small skin splits in that area.  She reports this is where she struck the wall.  CHEST:                        Breath sounds are clear.  CARDIAC:                      Regular rate and rhythm without murmur.  BREASTS:                      Soft, nontender.  ABDOMEN:                      Fundal height is approximately 38 cm.  Estimated fetal weight is 7 to 7-1/2 pounds.  Uterine contractions are very irregular and mild.  Cervical exam is deferred at present.  She has been 2 cm in the office.  Fetal heart rate is reactive over a two to three-hour monitoring period initially.  Abdomen is soft and nontender.  EXTREMITIES:                  She has an approximately nickel-sized abrasion on her left elbow and a 2-3 cm abrasion on her left knee.  NEUROLOGIC:                   Pupils are equally reactive to light.  There is no difficulty with grip strength.  She has full range of motion in  all her extremities.  LABORATORY DATA:              CBC shows hemoglobin of 11.3, hematocrit of 33.7, platelet count of 143, and WBC count of 9.6.  Clean-catch urine shows specific gravity of 1.025, moderate leukocyte esterase, many epithelialis, 11-20 wbcs, and many bacteria.  _____ is negative.  IMPRESSION:                   1.  Intrauterine pregnancy at 39 weeks.                               2. Status post paternal abdominal trauma.                                3. Rh negative.                               4. Urinary tract infection.  PLAN:                         1. Admit to the [facility] for 23-hour                                  observation, but discharge may be considered                                  this evening per consult with Dr. Pennie Rushing.                               2. Continuous electronic fetal monitoring.                               3. OB ultrasound to check the placenta and the                                  biophysical profile.                               4. Urine to culture.                               5. Will prescribe Macrobid or formulary                                  equivalent.                               6. Check antibody screen per consult with Dr.                                  Pennie Rushing.  Would not need RhoGAM if her  antibody screen Dictated by:   Nigel Bridgeman, C.N.M. Attending Physician:  Shaune Spittle DD:  08/30/01 TD:  08/30/01 Job: [id number]

## 2010-11-28 ENCOUNTER — Ambulatory Visit (INDEPENDENT_AMBULATORY_CARE_PROVIDER_SITE_OTHER): Payer: 59 | Admitting: Family Medicine

## 2010-11-28 ENCOUNTER — Encounter: Payer: Self-pay | Admitting: Family Medicine

## 2010-11-28 VITALS — BP 108/70 | HR 64 | Temp 99.0°F | Wt 154.0 lb

## 2010-11-28 DIAGNOSIS — F909 Attention-deficit hyperactivity disorder, unspecified type: Secondary | ICD-10-CM

## 2010-11-28 MED ORDER — ATOMOXETINE HCL 60 MG PO CAPS: 60.0000 mg | ORAL_CAPSULE | Freq: Every day | ORAL | Status: DC

## 2010-11-28 NOTE — Progress Notes (Signed)
  Subjective:    Patient ID: Alison Cervantes, female    DOB: [birth year], 30 y.o.   MRN: [id number]  HPI Here to discuss problems of focusing on tasks and getting things done on time. For the past years she has had difficulty focusing on tasks and completing them. 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 She often feels overwhelmed by what she needs to do, and this has affected her job and her personal life. She feels happy, and she denies any depression symptoms. She sleeps well. Appetite is good, and she works out 3 days a week.    Review of Systems  Constitutional: Negative.   Neurological: Negative.   Psychiatric/Behavioral: Negative.        Objective:   Physical Exam  Constitutional: She is oriented to person, place, and time. She appears well-developed and well-nourished.  Neurological: She is alert and oriented to person, place, and time. No cranial nerve deficit. She exhibits normal muscle tone. Coordination normal.  Psychiatric: She has a normal mood and affect. Her behavior is normal. Judgment and thought content normal.          Assessment & Plan:  She is showing signs of ADHD. Try Strattera daily. Recheck in one month

## 2010-12-01 LAB — CBC
HCT: 28.6 — ABNORMAL LOW
Hemoglobin: 11 — ABNORMAL LOW
MCHC: 33.7
Platelets: 145 — ABNORMAL LOW
Platelets: 192
RBC: 3.62 — ABNORMAL LOW
RDW: 13.7

## 2010-12-01 LAB — RPR: RPR Ser Ql: NONREACTIVE

## 2010-12-03 LAB — FETAL FIBRONECTIN: Fetal Fibronectin: NEGATIVE

## 2011-01-06 ENCOUNTER — Telehealth: Payer: Self-pay | Admitting: Family Medicine

## 2011-01-06 NOTE — Telephone Encounter (Signed)
Pt called and said that the Strattera is not working, she feels worse now than before and that you had discussed possibly trying Adderall or Vyvanse. Can she try either of those?

## 2011-01-07 MED ORDER — AMPHETAMINE-DEXTROAMPHET ER 20 MG PO CP24: 20.0000 mg | ORAL_CAPSULE | ORAL | Status: DC

## 2011-01-07 NOTE — Telephone Encounter (Signed)
Wrote for one month of Adderall XR. Have her see me in a few weeks

## 2011-01-07 NOTE — Telephone Encounter (Signed)
Script is ready for pick up and I left a voice message with below information. 

## 2011-02-06 ENCOUNTER — Telehealth: Payer: Self-pay | Admitting: Family Medicine

## 2011-02-06 MED ORDER — AMPHETAMINE-DEXTROAMPHET ER 30 MG PO CP24: 30.0000 mg | ORAL_CAPSULE | ORAL | Status: DC

## 2011-02-06 NOTE — Telephone Encounter (Signed)
Pt called and sch an ov for 02/13/11 for med check and refill ov. Pt said that she feels like the dosage for her Adderall XR 20mg , needs to be increased. Pt is going to run out of med in 3 days. Pt req either a new script with increased dosage, but enough of the 20mg s to last until pt comes in to her ov.

## 2011-02-06 NOTE — Telephone Encounter (Signed)
Pt aware and will pick up rx on Monday.

## 2011-02-06 NOTE — Telephone Encounter (Signed)
Try Adderall XR 30 mg a day. Keep the appt for next week

## 2011-02-13 ENCOUNTER — Ambulatory Visit: Payer: 59 | Admitting: Family Medicine

## 2011-02-13 DIAGNOSIS — Z0289 Encounter for other administrative examinations: Secondary | ICD-10-CM

## 2011-02-27 ENCOUNTER — Telehealth: Payer: Self-pay | Admitting: Family Medicine

## 2011-02-27 NOTE — Telephone Encounter (Signed)
Pt has cough/chest congestion for 1 wk. Pt has been taking otc nyquil but it hasn't help. Pt req ov or med to be called in to [facility].

## 2011-02-27 NOTE — Telephone Encounter (Signed)
Spoke with pt and she is going to call and try to get a Saturday appointment at [facility].

## 2011-03-16 ENCOUNTER — Ambulatory Visit (INDEPENDENT_AMBULATORY_CARE_PROVIDER_SITE_OTHER): Payer: 59 | Admitting: Family Medicine

## 2011-03-16 ENCOUNTER — Encounter: Payer: Self-pay | Admitting: Family Medicine

## 2011-03-16 VITALS — BP 110/68 | HR 78 | Temp 98.7°F | Wt 151.0 lb

## 2011-03-16 DIAGNOSIS — F909 Attention-deficit hyperactivity disorder, unspecified type: Secondary | ICD-10-CM

## 2011-03-16 DIAGNOSIS — M545 Low back pain, unspecified: Secondary | ICD-10-CM

## 2011-03-16 DIAGNOSIS — J3489 Other specified disorders of nose and nasal sinuses: Secondary | ICD-10-CM

## 2011-03-16 MED ORDER — AMPHETAMINE-DEXTROAMPHET ER 30 MG PO CP24: 30.0000 mg | ORAL_CAPSULE | ORAL | Status: DC

## 2011-03-16 MED ORDER — CYCLOBENZAPRINE HCL 10 MG PO TABS: 10.0000 mg | ORAL_TABLET | Freq: Three times a day (TID) | ORAL | Status: AC | PRN

## 2011-03-16 NOTE — Progress Notes (Signed)
  Subjective:    Patient ID: Alison Cervantes, female    DOB: [birth year], 31 y.o.   MRN: [id number]  HPI Here to follow up on ADHD, also for other issues. 2 weeks ago she had the sudden onset of sharp lower back pain with pains radiating down the backs of both legs. No numbness or weakness. She had been seeing a chiropractor for years for low back problems, but she has not seen him for several months. Motrin and one of her friend's muscle relaxers stopped the pain. Today the pain is gone. Also, she had several brief spells of a clear drainage for the right nostril. No URI or allergy symptoms. After researching this on the internet, she is wooried that this could be a spontaneous leak of CSF. No HA or neurologic deficits. No recent trauma.    Review of Systems  Constitutional: Negative.   HENT: Positive for rhinorrhea. Negative for nosebleeds, congestion, facial swelling, sneezing, neck pain and postnasal drip.   Eyes: Negative.   Respiratory: Negative.   Musculoskeletal: Positive for back pain.  Neurological: Negative.        Objective:   Physical Exam  Constitutional: She is oriented to person, place, and time. She appears well-developed and well-nourished.  HENT:  Head: Normocephalic and atraumatic.  Right Ear: External ear normal.  Left Ear: External ear normal.  Mouth/Throat: Oropharynx is clear and moist. No oropharyngeal exudate.  Eyes: Conjunctivae and EOM are normal. Pupils are equal, round, and reactive to light.  Musculoskeletal:       Tender in the lower back with no spasm and full ROM   Neurological: She is alert and oriented to person, place, and time. She has normal reflexes. No cranial nerve deficit. She exhibits normal muscle tone. Coordination normal.          Assessment & Plan:  Her ADHD is well controlled so we refilled the Adderall for 3 months. The nasal drainage is probably benign. A spontaneous CSF leak is exceedingly rare. She will return if this starts  again. As for the back pain, use Flexeril and heat and Motrin prn. She will start seeing her chiropractor again.

## 2011-03-19 ENCOUNTER — Telehealth: Payer: Self-pay | Admitting: Family Medicine

## 2011-03-19 DIAGNOSIS — J3489 Other specified disorders of nose and nasal sinuses: Secondary | ICD-10-CM

## 2011-03-19 NOTE — Telephone Encounter (Signed)
Pt aware of this. 

## 2011-03-19 NOTE — Telephone Encounter (Signed)
Pt was seen on 03-16-2011. Pt has developed metallic taste in mouth and tingling/numbness in lips,tongue. Pt would like to be referral to ent or neurlogist . ?spontaneous CSF

## 2011-03-19 NOTE — Telephone Encounter (Signed)
Tell her I did a referral to ENT and Camelia Eng will be calling the details

## 2011-03-25 ENCOUNTER — Other Ambulatory Visit: Payer: Self-pay | Admitting: Otolaryngology

## 2011-03-25 DIAGNOSIS — J329 Chronic sinusitis, unspecified: Secondary | ICD-10-CM

## 2011-03-27 ENCOUNTER — Ambulatory Visit
Admission: RE | Admit: 2011-03-27 | Discharge: 2011-03-27 | Disposition: A | Discharge status: Home / Self Care | Payer: 59 | Source: Ambulatory Visit | Attending: Otolaryngology | Admitting: Otolaryngology

## 2011-03-27 DIAGNOSIS — J329 Chronic sinusitis, unspecified: Secondary | ICD-10-CM

## 2011-07-02 ENCOUNTER — Ambulatory Visit (INDEPENDENT_AMBULATORY_CARE_PROVIDER_SITE_OTHER): Payer: 59 | Admitting: Family Medicine

## 2011-07-02 ENCOUNTER — Encounter: Payer: Self-pay | Admitting: Family Medicine

## 2011-07-02 VITALS — BP 106/70 | HR 88 | Temp 98.4°F | Wt 142.0 lb

## 2011-07-02 DIAGNOSIS — F909 Attention-deficit hyperactivity disorder, unspecified type: Secondary | ICD-10-CM

## 2011-07-02 DIAGNOSIS — G4700 Insomnia, unspecified: Secondary | ICD-10-CM

## 2011-07-02 MED ORDER — LORAZEPAM 1 MG PO TABS: 1.0000 mg | ORAL_TABLET | Freq: Every evening | ORAL | Status: AC | PRN

## 2011-07-02 MED ORDER — AMPHETAMINE-DEXTROAMPHET ER 30 MG PO CP24: 30.0000 mg | ORAL_CAPSULE | Freq: Two times a day (BID) | ORAL | Status: DC

## 2011-07-02 NOTE — Progress Notes (Signed)
  Subjective:    Patient ID: Alison Cervantes, female    DOB: 1980/11/17, 31 y.o.   MRN: [id number]  HPI Here to follow up on ADHD and for sleep problems. She is very please with Adderall XR as far as how it helps her focus and do her job, however it doesn't last very long. She takes it around 6 am on the mornings she works, and it seems to wear off by 2 in the afternoon. She works 10-12 hour shifts on average. Also she has been under more stress than usual since she is going through a divorce. She has trouble relaxing and falling asleep at night. She had used Ambien in the past but stopped this due to memory issues and being told that she did things and said things to other people that she cannot remember.    Review of Systems  Constitutional: Negative.   Neurological: Negative.   Psychiatric/Behavioral: Positive for sleep disturbance and decreased concentration. Negative for hallucinations, behavioral problems, confusion, dysphoric mood and agitation. The patient is not nervous/anxious.        Objective:   Physical Exam  Constitutional: She is oriented to person, place, and time. She appears well-developed and well-nourished.  Neurological: She is alert and oriented to person, place, and time.  Psychiatric: She has a normal mood and affect. Her behavior is normal. Thought content normal.          Assessment & Plan:  Increase Adderall XR to 30 mg bid. Try Lorazepam for sleep. Recheck in one month

## 2011-08-19 ENCOUNTER — Telehealth: Payer: Self-pay | Admitting: Family Medicine

## 2011-08-19 MED ORDER — AMPHETAMINE-DEXTROAMPHET ER 30 MG PO CP24: 30.0000 mg | ORAL_CAPSULE | Freq: Two times a day (BID) | ORAL | Status: DC

## 2011-08-19 NOTE — Telephone Encounter (Signed)
Patient called stating that she need a refill of her adderall. Please assist.  °

## 2011-08-19 NOTE — Telephone Encounter (Signed)
done

## 2011-08-19 NOTE — Telephone Encounter (Signed)
Script is ready for pick up and left voice message. 

## 2011-10-29 ENCOUNTER — Telehealth: Payer: Self-pay | Admitting: Family Medicine

## 2011-10-29 NOTE — Telephone Encounter (Signed)
Pt called and just went part-time on her job and her insurance ends tomorrow. Pt is wondering if she could change back to regular generic Adderall 30mg  twice a day for 3 month supply? Pt says that XR is too expensive.

## 2011-10-30 MED ORDER — AMPHETAMINE-DEXTROAMPHETAMINE 30 MG PO TABS: 30.0000 mg | ORAL_TABLET | Freq: Two times a day (BID) | ORAL | Status: DC

## 2011-10-30 NOTE — Telephone Encounter (Signed)
Scripts are ready for pick up and I left a voice message. 

## 2011-10-30 NOTE — Telephone Encounter (Signed)
done

## 2012-03-31 ENCOUNTER — Emergency Department: Payer: Self-pay | Admitting: Emergency Medicine

## 2012-04-18 ENCOUNTER — Telehealth: Payer: Self-pay | Admitting: Family Medicine

## 2012-04-18 NOTE — Telephone Encounter (Signed)
Pt needs refill of amphetamine-dextroamphetamine (ADDERALL) 30 MG tablet ° °

## 2012-04-19 MED ORDER — AMPHETAMINE-DEXTROAMPHETAMINE 30 MG PO TABS: 30.0000 mg | ORAL_TABLET | Freq: Two times a day (BID) | ORAL | Status: DC

## 2012-04-19 NOTE — Telephone Encounter (Signed)
done

## 2012-04-19 NOTE — Telephone Encounter (Signed)
Already done

## 2012-04-19 NOTE — Telephone Encounter (Signed)
Script is ready for pick up and I left a voice message for pt. 

## 2012-09-13 ENCOUNTER — Telehealth: Payer: Self-pay | Admitting: Family Medicine

## 2012-09-13 NOTE — Telephone Encounter (Signed)
I spoke with pt and she is aware that Dr. Clent Ridges is out of the office until end of week.

## 2012-09-13 NOTE — Telephone Encounter (Signed)
PT called to request a 3 month refill of her amphetamine-dextroamphetamine (ADDERALL) 30 MG tablet. She is also requesting .5mg  xanax (or something else besides ambien) to assist her in sleeping, as you have discussed this in the past. She states that she works 12 hour shifts and would only need them on the nights that she is working, as her ADDERALL hasn't worn off by the time she is ready to go to sleep. Please assist.

## 2012-09-16 MED ORDER — AMPHETAMINE-DEXTROAMPHETAMINE 30 MG PO TABS: 30.0000 mg | ORAL_TABLET | Freq: Two times a day (BID) | ORAL | Status: DC

## 2012-09-16 MED ORDER — ALPRAZOLAM 0.5 MG PO TABS: 0.5000 mg | ORAL_TABLET | Freq: Two times a day (BID) | ORAL | Status: DC

## 2012-09-16 NOTE — Telephone Encounter (Signed)
Scripts are ready for pick up and I spoke with pt. 

## 2012-09-16 NOTE — Telephone Encounter (Signed)
Both were written  

## 2013-02-02 ENCOUNTER — Telehealth: Payer: Self-pay | Admitting: Family Medicine

## 2013-02-02 NOTE — Telephone Encounter (Signed)
Pt request refill amphetamine-dextroamphetamine (ADDERALL) 30 MG tablet  #60  3 mo supply Pt also needs  ALPRAZolam (XANAX) 0.5 MG tablet [facility]

## 2013-02-03 NOTE — Telephone Encounter (Signed)
I left voice message with below information. 

## 2013-02-03 NOTE — Telephone Encounter (Signed)
She needs an OV first

## 2013-02-10 ENCOUNTER — Telehealth: Payer: Self-pay | Admitting: Family Medicine

## 2013-02-10 MED ORDER — ALPRAZOLAM 0.5 MG PO TABS: 0.5000 mg | ORAL_TABLET | Freq: Two times a day (BID) | ORAL | Status: DC

## 2013-02-10 MED ORDER — AMPHETAMINE-DEXTROAMPHETAMINE 30 MG PO TABS: 30.0000 mg | ORAL_TABLET | Freq: Two times a day (BID) | ORAL | Status: DC

## 2013-02-10 NOTE — Telephone Encounter (Signed)
We wrote for one month of these only. She has made an OV appt for next month

## 2013-02-10 NOTE — Telephone Encounter (Signed)
Rx re-fill for amphetamine-dextroamphetamine (ADDERALL) 30 MG tablet and ALPRAZolam (XANAX) 0.5 MG tablet.  [facility] [address]  Thanks

## 2013-03-10 ENCOUNTER — Ambulatory Visit (INDEPENDENT_AMBULATORY_CARE_PROVIDER_SITE_OTHER): Payer: BC Managed Care – PPO | Admitting: Family Medicine

## 2013-03-10 ENCOUNTER — Encounter: Payer: Self-pay | Admitting: Family Medicine

## 2013-03-10 VITALS — BP 114/78 | HR 107 | Temp 97.9°F | Wt 150.0 lb

## 2013-03-10 DIAGNOSIS — F3289 Other specified depressive episodes: Secondary | ICD-10-CM

## 2013-03-10 DIAGNOSIS — F1010 Alcohol abuse, uncomplicated: Secondary | ICD-10-CM | POA: Insufficient documentation

## 2013-03-10 DIAGNOSIS — F329 Major depressive disorder, single episode, unspecified: Secondary | ICD-10-CM

## 2013-03-10 DIAGNOSIS — F32A Depression, unspecified: Secondary | ICD-10-CM

## 2013-03-10 DIAGNOSIS — F909 Attention-deficit hyperactivity disorder, unspecified type: Secondary | ICD-10-CM | POA: Insufficient documentation

## 2013-03-10 LAB — HEPATIC FUNCTION PANEL
ALK PHOS: 42 U/L (ref 39–117)
ALT: 16 U/L (ref 0–35)
AST: 18 U/L (ref 0–37)
Albumin: 4.6 g/dL (ref 3.5–5.2)
BILIRUBIN DIRECT: 0.1 mg/dL (ref 0.0–0.3)
Total Bilirubin: 0.9 mg/dL (ref 0.3–1.2)
Total Protein: 7.5 g/dL (ref 6.0–8.3)

## 2013-03-10 LAB — POCT URINALYSIS DIPSTICK
BILIRUBIN UA: NEGATIVE
Blood, UA: NEGATIVE
GLUCOSE UA: NEGATIVE
Ketones, UA: NEGATIVE
Leukocytes, UA: NEGATIVE
Nitrite, UA: NEGATIVE
Protein, UA: NEGATIVE
SPEC GRAV UA: 1.015
Urobilinogen, UA: 0.2
pH, UA: 7

## 2013-03-10 LAB — BASIC METABOLIC PANEL
BUN: 15 mg/dL (ref 6–23)
CALCIUM: 9.4 mg/dL (ref 8.4–10.5)
CO2: 26 mEq/L (ref 19–32)
Chloride: 103 mEq/L (ref 96–112)
Creatinine, Ser: 0.7 mg/dL (ref 0.4–1.2)
GFR: 104.93 mL/min (ref >60.00)
Glucose, Bld: 79 mg/dL (ref 70–99)
POTASSIUM: 4.6 meq/L (ref 3.5–5.1)
SODIUM: 138 meq/L (ref 135–145)

## 2013-03-10 LAB — CBC WITH DIFFERENTIAL/PLATELET
BASOS ABS: 0.1 10*3/uL (ref 0.0–0.1)
BASOS PCT: 0.6 % (ref 0.0–3.0)
EOS ABS: 0.1 10*3/uL (ref 0.0–0.7)
EOS PCT: 1.4 % (ref 0.0–5.0)
HEMATOCRIT: 43.2 % (ref 36.0–46.0)
HEMOGLOBIN: 14.8 g/dL (ref 12.0–15.0)
Lymphocytes Relative: 16.8 % (ref 12.0–46.0)
Lymphs Abs: 1.5 10*3/uL (ref 0.7–4.0)
MCHC: 34.2 g/dL (ref 30.0–36.0)
MCV: 97.4 fl (ref 78.0–100.0)
Monocytes Absolute: 0.5 10*3/uL (ref 0.1–1.0)
Monocytes Relative: 5.5 % (ref 3.0–12.0)
NEUTROS ABS: 6.7 10*3/uL (ref 1.4–7.7)
Neutrophils Relative %: 75.7 % (ref 43.0–77.0)
Platelets: 202 10*3/uL (ref 150.0–400.0)
RBC: 4.43 Mil/uL (ref 3.87–5.11)
RDW: 12.9 % (ref 11.5–14.6)
WBC: 8.8 10*3/uL (ref 4.5–10.5)

## 2013-03-10 LAB — TSH: TSH: 2.09 u[IU]/mL (ref 0.35–5.50)

## 2013-03-10 MED ORDER — AMPHETAMINE-DEXTROAMPHETAMINE 30 MG PO TABS: ORAL_TABLET | ORAL | Status: DC

## 2013-03-10 MED ORDER — AMPHETAMINE-DEXTROAMPHET ER 30 MG PO CP24: ORAL_CAPSULE | ORAL | Status: DC

## 2013-03-10 MED ORDER — BUPROPION HCL ER (XL) 150 MG PO TB24: 150.0000 mg | ORAL_TABLET | Freq: Every day | ORAL | Status: DC

## 2013-03-10 NOTE — Progress Notes (Signed)
   Subjective:    Patient ID: Alison Cervantes, female    DOB: 11-19-1980, 33 y.o.   MRN: [id number]  HPI Here to follow up on ADHD and depression. She wants to change the Adderall so she can take an XR in the am and a regular dose in the evening. Also she has beend very stressed and depressed lately due to a nasty divorce and now court battles over child custody. She has started drinking alcohol heavily, and she is worried that she might become an alcoholic. Her father is an alcoholic, and she knows she is drinking to calm her nerves. She drinks at least a bottle of wine a day and often drinks liquor as well. She still works full time as an Public relations account executive.    Review of Systems  Constitutional: Negative.   Psychiatric/Behavioral: Positive for dysphoric mood. Negative for hallucinations, confusion, decreased concentration and agitation. The patient is nervous/anxious.        Objective:   Physical Exam  Constitutional: She is oriented to person, place, and time. She appears well-developed and well-nourished.  Neurological: She is alert and oriented to person, place, and time.  Psychiatric: She has a normal mood and affect. Her behavior is normal. Judgment and thought content normal.          Assessment & Plan:  I agree that she is abusing alcohol and needs to cut way back. We will start on Wellbutrin XL 150 mg a day and we will probably need to titrate up on this dose. Change the Adderall as above. Get labs today. Recheck in 2 weeks

## 2013-03-10 NOTE — Progress Notes (Signed)
Pre visit review using our clinic review tool, if applicable. No additional management support is needed unless otherwise documented below in the visit note. 

## 2013-03-11 ENCOUNTER — Emergency Department (HOSPITAL_COMMUNITY): Payer: BC Managed Care – PPO

## 2013-03-11 ENCOUNTER — Encounter (HOSPITAL_COMMUNITY): Payer: Self-pay | Admitting: Emergency Medicine

## 2013-03-11 ENCOUNTER — Emergency Department (HOSPITAL_COMMUNITY)
Admission: EM | Admit: 2013-03-11 | Discharge: 2013-03-12 | Disposition: A | Discharge status: Home / Self Care | Payer: BC Managed Care – PPO | Attending: Emergency Medicine | Admitting: Emergency Medicine

## 2013-03-11 DIAGNOSIS — Z8711 Personal history of peptic ulcer disease: Secondary | ICD-10-CM | POA: Insufficient documentation

## 2013-03-11 DIAGNOSIS — F909 Attention-deficit hyperactivity disorder, unspecified type: Secondary | ICD-10-CM | POA: Insufficient documentation

## 2013-03-11 DIAGNOSIS — Z862 Personal history of diseases of the blood and blood-forming organs and certain disorders involving the immune mechanism: Secondary | ICD-10-CM | POA: Insufficient documentation

## 2013-03-11 DIAGNOSIS — K6289 Other specified diseases of anus and rectum: Secondary | ICD-10-CM

## 2013-03-11 DIAGNOSIS — D649 Anemia, unspecified: Secondary | ICD-10-CM | POA: Insufficient documentation

## 2013-03-11 DIAGNOSIS — Z3202 Encounter for pregnancy test, result negative: Secondary | ICD-10-CM | POA: Insufficient documentation

## 2013-03-11 DIAGNOSIS — J45909 Unspecified asthma, uncomplicated: Secondary | ICD-10-CM | POA: Insufficient documentation

## 2013-03-11 DIAGNOSIS — Z79899 Other long term (current) drug therapy: Secondary | ICD-10-CM | POA: Insufficient documentation

## 2013-03-11 LAB — COMPREHENSIVE METABOLIC PANEL
ALK PHOS: 49 U/L (ref 39–117)
ALT: 12 U/L (ref 0–35)
AST: 15 U/L (ref 0–37)
Albumin: 4.2 g/dL (ref 3.5–5.2)
BILIRUBIN TOTAL: 1 mg/dL (ref 0.3–1.2)
BUN: 12 mg/dL (ref 6–23)
CO2: 24 mEq/L (ref 19–32)
Calcium: 9.2 mg/dL (ref 8.4–10.5)
Chloride: 102 mEq/L (ref 96–112)
Creatinine, Ser: 0.66 mg/dL (ref 0.50–1.10)
GFR calc non Af Amer: >90 mL/min (ref >90)
GLUCOSE: 88 mg/dL (ref 70–99)
POTASSIUM: 3.8 meq/L (ref 3.7–5.3)
Sodium: 139 mEq/L (ref 137–147)
Total Protein: 7 g/dL (ref 6.0–8.3)

## 2013-03-11 LAB — URINALYSIS, ROUTINE W REFLEX MICROSCOPIC
BILIRUBIN URINE: NEGATIVE
GLUCOSE, UA: NEGATIVE mg/dL
HGB URINE DIPSTICK: NEGATIVE
Ketones, ur: 40 mg/dL — AB
Leukocytes, UA: NEGATIVE
Nitrite: NEGATIVE
Protein, ur: NEGATIVE mg/dL
Specific Gravity, Urine: 1.008 (ref 1.005–1.030)
UROBILINOGEN UA: 0.2 mg/dL (ref 0.0–1.0)
pH: 6 (ref 5.0–8.0)

## 2013-03-11 LAB — CBC
HEMATOCRIT: 39.4 % (ref 36.0–46.0)
Hemoglobin: 14.1 g/dL (ref 12.0–15.0)
MCH: 34.1 pg — ABNORMAL HIGH (ref 26.0–34.0)
MCHC: 35.8 g/dL (ref 30.0–36.0)
MCV: 95.2 fL (ref 78.0–100.0)
Platelets: 194 10*3/uL (ref 150–400)
RBC: 4.14 MIL/uL (ref 3.87–5.11)
RDW: 12.3 % (ref 11.5–15.5)
WBC: 13.3 10*3/uL — ABNORMAL HIGH (ref 4.0–10.5)

## 2013-03-11 LAB — OCCULT BLOOD, POC DEVICE: FECAL OCCULT BLD: POSITIVE — AB

## 2013-03-11 LAB — PREGNANCY, URINE: Preg Test, Ur: NEGATIVE

## 2013-03-11 LAB — LIPASE, BLOOD: LIPASE: 14 U/L (ref 11–59)

## 2013-03-11 MED ORDER — IOHEXOL 300 MG/ML  SOLN
100.0000 mL | Freq: Once | INTRAMUSCULAR | Status: AC | PRN
  Administered 2013-03-11: 100 mL via INTRAVENOUS

## 2013-03-11 MED ORDER — HYDROCORTISONE ACETATE 25 MG RE SUPP
25.0000 mg | Freq: Once | RECTAL | Status: AC
  Administered 2013-03-11: 25 mg via RECTAL
  Filled 2013-03-11: qty 1

## 2013-03-11 MED ORDER — ONDANSETRON HCL 4 MG/2ML IJ SOLN
4.0000 mg | Freq: Once | INTRAMUSCULAR | Status: AC
  Administered 2013-03-11: 4 mg via INTRAVENOUS
  Filled 2013-03-11: qty 2

## 2013-03-11 MED ORDER — HYDROMORPHONE HCL PF 1 MG/ML IJ SOLN
1.0000 mg | Freq: Once | INTRAMUSCULAR | Status: AC
  Administered 2013-03-11: 1 mg via INTRAVENOUS
  Filled 2013-03-11: qty 1

## 2013-03-11 MED ORDER — IOHEXOL 300 MG/ML  SOLN
50.0000 mL | Freq: Once | INTRAMUSCULAR | Status: AC | PRN
  Administered 2013-03-11: 50 mL via ORAL

## 2013-03-11 MED ORDER — HYDROCORTISONE 2.5 % RE CREA: TOPICAL_CREAM | RECTAL | Status: DC

## 2013-03-11 MED ORDER — SODIUM CHLORIDE 0.9 % IV BOLUS (SEPSIS)
1000.0000 mL | Freq: Once | INTRAVENOUS | Status: AC
  Administered 2013-03-11: 1000 mL via INTRAVENOUS

## 2013-03-11 MED ORDER — HYDROCORTISONE ACETATE 25 MG RE SUPP: 25.0000 mg | Freq: Two times a day (BID) | RECTAL | Status: DC | PRN

## 2013-03-11 MED ORDER — HYDROCODONE-ACETAMINOPHEN 5-325 MG PO TABS: 1.0000 | ORAL_TABLET | Freq: Four times a day (QID) | ORAL | Status: DC | PRN

## 2013-03-11 NOTE — Discharge Instructions (Signed)
Rest. Drink plenty of fluids. Avoid hydrogen peroxide enemas.  You may try sitz baths as need for symptom relief.  You may also use anusol suppository or cream as need for symptom relief. Take motrin or aleve as need for pain. You may also take hydrocodone as need for pain. No driving for the next 6 hours or when taking hydrocodone. Also, do not take tylenol or acetaminophen containing medication when taking hydrocodone. Follow up with gi doctor in the next few days if symptoms fail to improve/resolve. Return to ER right away if worse, new symptoms, fevers, heavy bleeding, severe diarrhea, other concern.    Proctitis Proctitis is the swelling and soreness (inflammation) of the lining of the rectum. The rectum is at the end of the large intestine and is attached to the anus. The inflammation causes pain and discomfort. It may be short-term (acute) or long-lasting (chronic). CAUSES Inflammation in the rectum can be caused by many things, such as:  Sexually transmitted diseases (STDs).  Infection.  Anal-rectal trauma or injury.  Ulcerative colitis or Crohn's disease.  Radiation therapy directed near the rectum.  Antibiotic therapy. SYMPTOMS  Sudden, uncomfortable, and frequent urge to have a bowel movement.  Anal or rectal pain.  Abdominal cramping or pain.  Sensation that the rectum is full.  Rectal bleeding.  Pus or mucus discharge from anus.  Diarrhea or frequent soft, loose stools. DIAGNOSIS Diagnosis may include the following:  A history and physical exam.  An STD test.  Blood tests.  Stool tests.  Rectal culture.  A procedure to evaluate the anal canal (anoscopy).  Procedures to look at part, or the entire large bowel (sigmoidoscopy, colonoscopy). TREATMENT Treatment of proctitis depends on the cause. Reducing the symptoms of inflammation and eliminating infection are the main goals of treatment. Treatment may include:  Home remedies and lifestyle, such  as sitz baths and avoiding food right before bedtime.  Topical ointments, foams, suppositories, or enemas, such as corticosteroids or anti-inflammatories.  Antibiotic or antiviral medicines to treat infection or to control harmful bacteria.  Medicines to control diarrhea, soften stools, and reduce pain.  Medicines to suppress the immune system.  Avoiding the activity that caused rectal trauma.  Nutritional, dietary, or herbal supplements.  Heat or laser therapy for persistent bleeding.  A dilation procedure to enlarge a narrowed rectum.  Surgery, though rare, may be necessary to repair damaged rectal lining. HOME CARE INSTRUCTIONS Only take medicines that are recommended or approved by your caregiver.Do not take anti-diarrhea medicine without your caregiver's approval. SEEK MEDICAL CARE IF:  You often experience one or more of the symptoms noted above.  You keep experiencing symptoms after treatment.  You have questions or concerns about your symptoms or treatment plan. MAKE SURE YOU:  Understand these instructions.  Will watch your condition.  Will get help right away if you are not doing well or get worse. [place] of Diabetes and Digestive and Kidney Disease (NIDDK): www.digestive.https://gonzalez-best.com/ Document Released: 02/05/2011 Document Revised: 06/13/2012 Document Reviewed: 02/05/2011 [facility] Patient Information 2014 [place], [place].    Sitz Bath A sitz bath is a warm water bath taken in the sitting position that covers only the hips and buttocks. It may be used for either healing or hygiene purposes. Sitz baths are also used to relieve pain, itching, or muscle spasms. The water may contain medicine. Moist heat will help you heal and relax.  HOME CARE INSTRUCTIONS  Take 3 to 4 sitz baths a day. 1. Fill  the bathtub half full with warm water. 2. Sit in the water and open the drain a little. 3. Turn on the warm water to keep the tub half  full. Keep the water running constantly. 4. Soak in the water for 15 to 20 minutes. 5. After the sitz bath, pat the affected area dry first. SEEK MEDICAL CARE IF:  You get worse instead of better. Stop the sitz baths if you get worse. MAKE SURE YOU:  Understand these instructions.  Will watch your condition.  Will get help right away if you are not doing well or get worse. Document Released: 11/09/2003 Document Revised: 11/11/2011 Document Reviewed: 05/16/2010 [facility] Patient Information 2014 [place], [place].

## 2013-03-11 NOTE — ED Notes (Addendum)
Patient is here from home. Patient c/o abdominal pain sine 0300 this morning. Patient states she has had nauseas and bloody diarrhea. Patient states she has a lot of pressure in her rectum. Patient states this is new onset and has gotten progressively worst throughout the day. Patient reports taken 800 mg ibuprofen around 0500  and tylenol at 1400 with no relief to pain. Patient states she gave herself a saline with peroxide enema around 1100 last night.

## 2013-03-11 NOTE — ED Notes (Signed)
Bed: WA12 Expected date:  Expected time:  Means of arrival:  Comments: EMS 

## 2013-03-11 NOTE — ED Provider Notes (Addendum)
CSN: [id number]     Arrival date & time 03/11/13  1930 History   First MD Initiated Contact with Patient 03/11/13 1937     Chief Complaint  Patient presents with  . Abdominal Pain   (Consider location/radiation/quality/duration/timing/severity/associated sxs/prior Treatment) Patient is a 33 y.o. female presenting with abdominal pain. The history is provided by the patient.  Abdominal Pain Associated symptoms: diarrhea and nausea   Associated symptoms: no chest pain, no chills, no cough, no dysuria, no fever, no shortness of breath, no sore throat and no vomiting   pt c/o onset diarrhea last evening, associated w lower abd/rectal pain.  Several diarrhea stools, watery, last of which w streaks brb. Nausea. No vomiting. No abd distension. Had been having normal bms, did give self enema yesterday, pt states just part of 'normal cleanse', although states used a hydrogen peroxide enema yesterday which she had never used before, and that symptoms started some time after using that enema.  Denies known bad food ingestion, ill contacts, or recent abx use. Lower abd pain/cramping/bilateral, but main pain is rectal pressure, dull, cramping, constant, dull, non radiating. No vaginal discharge or bleeding. Has IUD 2009. Denies dysuria. No fever or chills. No flank pain. Denies cough or uri c/o. Denies prior abd surgery or hx chronic gi issues. No hx endometriosis or ovarian cyst.     Past Medical History  Diagnosis Date  . Anemia   . Asthma   . Depression   . Peptic ulcer disease   . Headache(784.0)   . ADHD (attention deficit hyperactivity disorder)    No past surgical history on file. Family History  Problem Relation Age of Onset  . Alcohol abuse      family hx  . Breast cancer      1st degree family <50  . Coronary artery disease      1st degree ><60  . Diabetes      family hx  . Hypertension      family hx  . Lung cancer      family hx  . Alcohol abuse Father    History  Substance  Use Topics  . Smoking status: Never Smoker   . Smokeless tobacco: Never Used  . Alcohol Use: 0.0 oz/week     Comment: wine every day   OB History   Grav Para Term Preterm Abortions TAB SAB Ect Mult Living                 Review of Systems  Constitutional: Negative for fever and chills.  HENT: Negative for sore throat.   Eyes: Negative for redness.  Respiratory: Negative for cough and shortness of breath.   Cardiovascular: Negative for chest pain and leg swelling.  Gastrointestinal: Positive for nausea, abdominal pain and diarrhea. Negative for vomiting.  Genitourinary: Negative for dysuria and flank pain.  Musculoskeletal: Negative for back pain and neck pain.  Skin: Negative for rash.  Neurological: Negative for headaches.  Hematological: Does not bruise/bleed easily.  Psychiatric/Behavioral: Negative for confusion.    Allergies  Review of patient's allergies indicates no known allergies.  Home Medications   Current Outpatient Rx  Name  Route  Sig  Dispense  Refill  . ALPRAZolam (XANAX) 0.5 MG tablet   Oral   Take 1 tablet (0.5 mg total) by mouth 2 (two) times daily.   60 tablet   0   . amphetamine-dextroamphetamine (ADDERALL XR) 30 MG 24 hr capsule      Once a day in the  morning   30 capsule   0   . amphetamine-dextroamphetamine (ADDERALL) 30 MG tablet      Once a day in the evening   30 tablet   0   . buPROPion (WELLBUTRIN XL) 150 MG 24 hr tablet   Oral   Take 1 tablet (150 mg total) by mouth daily.   30 tablet   5   . fish oil-omega-3 fatty acids 1000 MG capsule   Oral   Take 2 g by mouth daily.           . Multiple Vitamins-Minerals (WOMENS ONE DAILY PO)   Oral   Take 1 tablet by mouth daily.           . promethazine (PHENERGAN) 25 MG tablet   Oral   Take 25 mg by mouth every 4 (four) hours as needed.           . SUMAtriptan (IMITREX) 100 MG tablet   Oral   Take 100 mg by mouth as needed.            There were no vitals taken for  this visit. Physical Exam  Nursing note and vitals reviewed. Constitutional: She is oriented to person, place, and time. She appears well-developed and well-nourished. No distress.  HENT:  Mouth/Throat: Oropharynx is clear and moist.  Eyes: Conjunctivae are normal. No scleral icterus.  Neck: Neck supple. No tracheal deviation present.  Cardiovascular: Normal rate, regular rhythm, normal heart sounds and intact distal pulses.   Pulmonary/Chest: Effort normal and breath sounds normal. No respiratory distress.  Abdominal: Soft. Normal appearance and bowel sounds are normal. She exhibits no distension and no mass. There is tenderness. There is no rebound and no guarding.  Lower abd tenderness, no rebound or guarding.   Genitourinary:  No cva tenderness. Rectal exam, normal external, scant amount liquid stool w streaks brb, no melena. Heme pos.   Musculoskeletal: She exhibits no edema and no tenderness.  Neurological: She is alert and oriented to person, place, and time.  Skin: Skin is warm and dry. No rash noted.  Psychiatric: She has a normal mood and affect.    ED Course  Procedures (including critical care time)  Results for orders placed during the hospital encounter of 03/11/13  CBC      Result Value Range   WBC 13.3 (*) 4.0 - 10.5 K/uL   RBC 4.14  3.87 - 5.11 MIL/uL   Hemoglobin 14.1  12.0 - 15.0 g/dL   HCT 39.4  36.0 - 46.0 %   MCV 95.2  78.0 - 100.0 fL   MCH 34.1 (*) 26.0 - 34.0 pg   MCHC 35.8  30.0 - 36.0 g/dL   RDW 12.3  11.5 - 15.5 %   Platelets 194  150 - 400 K/uL  COMPREHENSIVE METABOLIC PANEL      Result Value Range   Sodium 139  137 - 147 mEq/L   Potassium 3.8  3.7 - 5.3 mEq/L   Chloride 102  96 - 112 mEq/L   CO2 24  19 - 32 mEq/L   Glucose, Bld 88  70 - 99 mg/dL   BUN 12  6 - 23 mg/dL   Creatinine, Ser 0.66  0.50 - 1.10 mg/dL   Calcium 9.2  8.4 - 10.5 mg/dL   Total Protein 7.0  6.0 - 8.3 g/dL   Albumin 4.2  3.5 - 5.2 g/dL   AST 15  0 - 37 U/L   ALT 12  0 -  35 U/L   Alkaline Phosphatase 49  39 - 117 U/L   Total Bilirubin 1.0  0.3 - 1.2 mg/dL   GFR calc non Af Amer >90  >90 mL/min   GFR calc Af Amer >90  >90 mL/min  LIPASE, BLOOD      Result Value Range   Lipase 14  11 - 59 U/L  PREGNANCY, URINE      Result Value Range   Preg Test, Ur NEGATIVE  NEGATIVE  URINALYSIS, ROUTINE W REFLEX MICROSCOPIC      Result Value Range   Color, Urine YELLOW  YELLOW   APPearance CLEAR  CLEAR   Specific Gravity, Urine 1.008  1.005 - 1.030   pH 6.0  5.0 - 8.0   Glucose, UA NEGATIVE  NEGATIVE mg/dL   Hgb urine dipstick NEGATIVE  NEGATIVE   Bilirubin Urine NEGATIVE  NEGATIVE   Ketones, ur 40 (*) NEGATIVE mg/dL   Protein, ur NEGATIVE  NEGATIVE mg/dL   Urobilinogen, UA 0.2  0.0 - 1.0 mg/dL   Nitrite NEGATIVE  NEGATIVE   Leukocytes, UA NEGATIVE  NEGATIVE   Ct Abdomen Pelvis W Contrast  03/11/2013   CLINICAL DATA:  Left lower quadrant pain.  EXAM: CT ABDOMEN AND PELVIS WITH CONTRAST  TECHNIQUE: Multidetector CT imaging of the abdomen and pelvis was performed using the standard protocol following bolus administration of intravenous contrast.  CONTRAST:  50 mL OMNIPAQUE IOHEXOL 300 MG/ML SOLN, 100 mL OMNIPAQUE IOHEXOL 300 MG/ML SOLN  COMPARISON:  None.  FINDINGS: The lung bases are clear.  No pleural or pericardial effusion.  The gallbladder, liver, spleen, adrenal glands, pancreas, kidneys and biliary tree are unremarkable. IUD is in place in the uterus. Adnexa are unremarkable. Urinary bladder appears normal.  The walls of the rectosigmoid colon appear markedly thickened and there is a small amount of perirectal free fluid but no abscess. The colon is otherwise normal in appearance. The appendix, stomach and small bowel are normal. No lymphadenopathy is identified. No focal bony abnormality is seen.  IMPRESSION: Findings compatible with infectious or inflammatory proctitis with an associated small amount of free pelvic fluid. Negative for abscess or perforation.    Electronically Signed   By: Inge Rise M.D.   On: 03/11/2013 22:58     EKG Interpretation   None       MDM  Iv ns bolus. zofran iv. Dilaudid 1 mg iv.  Labs.  Reviewed nursing notes and prior charts for additional history.   Pt notes symptoms began after using enema (hydrogen peroxide) - ?whether caused irritant/chemical proctitis.  No preceding gi symptoms. No fevers. Do not feel likely that is infectious.   rec symptomatic relief, sitz baths, anusol cream, pain rx.  Will discuss w gi md on call re additional recommendations and follow up.    pcp follow up Monday, possible gi referral then if not improved/resolved.  Return to ED if worse, fevers, heavy bleeding.   Discussed w GI, Dr Michail Sermon, he indicates symptomatic rx for pain, anusol, etc, but should be self limited.      Mirna Mires, MD 03/11/13 [phone]

## 2013-04-05 ENCOUNTER — Encounter: Payer: Self-pay | Admitting: Family Medicine

## 2013-04-12 ENCOUNTER — Telehealth: Payer: Self-pay | Admitting: Family Medicine

## 2013-04-12 NOTE — Telephone Encounter (Signed)
Pt request 90 day of amphetamine-dextroamphetamine (ADDERALL XR) 30 MG 24 hr capsule (1 x day in am) and amphetamine-dextroamphetamine (ADDERALL) 30 MG tablet (1/ day in evening) Pt states the buPROPion (WELLBUTRIN XL) 150 MG 24 hr tablet  Did not work. Pt states she had side effects. Pt states alternates were discussed. Can she try something else or does pt need another appt? Pt prefers not to have to come in.

## 2013-04-13 MED ORDER — FLUOXETINE HCL 20 MG PO TABS: 20.0000 mg | ORAL_TABLET | Freq: Every day | ORAL | Status: DC

## 2013-04-13 MED ORDER — AMPHETAMINE-DEXTROAMPHET ER 30 MG PO CP24: ORAL_CAPSULE | ORAL | Status: DC

## 2013-04-13 MED ORDER — AMPHETAMINE-DEXTROAMPHETAMINE 30 MG PO TABS: ORAL_TABLET | ORAL | Status: DC

## 2013-04-13 NOTE — Telephone Encounter (Signed)
I refilled both forms of Adderall but for one month only. I need for her to keep close contact with [place] for now.  We will try Prozac 20 mg daily.

## 2013-04-14 NOTE — Telephone Encounter (Signed)
Scripts are ready for pick up, I tried to reach pt by phone and no answer or option to leave a message. I wrote a note on scripts for pick up, that pt must schedule a office visit for future refills.

## 2013-06-15 ENCOUNTER — Telehealth: Payer: Self-pay | Admitting: Family Medicine

## 2013-06-15 NOTE — Telephone Encounter (Signed)
Pt needs new rxs generic adderall xr 30 mg and generic adderall 30 mg. Pt would like rxs for both medication for 3 months. Pt was last seen on 03-10-2013

## 2013-06-20 MED ORDER — AMPHETAMINE-DEXTROAMPHETAMINE 30 MG PO TABS: ORAL_TABLET | ORAL | Status: DC

## 2013-06-20 MED ORDER — AMPHETAMINE-DEXTROAMPHET ER 30 MG PO CP24: ORAL_CAPSULE | ORAL | Status: DC

## 2013-06-20 NOTE — Telephone Encounter (Signed)
Pt is still waiting on rx adderall, states she has been without it for 1 week. Pt requesting update

## 2013-06-20 NOTE — Telephone Encounter (Signed)
done

## 2013-06-21 ENCOUNTER — Encounter (INDEPENDENT_AMBULATORY_CARE_PROVIDER_SITE_OTHER): Payer: Self-pay

## 2013-06-21 NOTE — Telephone Encounter (Signed)
Scripts are ready for pick up and I left a voice message for pt. 

## 2013-07-25 ENCOUNTER — Telehealth: Payer: Self-pay | Admitting: Family Medicine

## 2013-07-25 NOTE — Telephone Encounter (Signed)
Pt needs new rxs generic adderall xr 30 mg and generic adderall 30 mg. Pt lost June and July rxs. Pt would like to pick up rxs today

## 2013-07-25 NOTE — Telephone Encounter (Signed)
NO, she not due until July 21. We will NOT compromise on this. It is her responsibility to keep up with highly regulated medications

## 2013-07-26 ENCOUNTER — Other Ambulatory Visit: Payer: Self-pay | Admitting: Family Medicine

## 2013-07-26 NOTE — Telephone Encounter (Signed)
I spoke with pt and went over the below information.

## 2013-07-27 MED ORDER — ALPRAZOLAM 0.5 MG PO TABS: 0.5000 mg | ORAL_TABLET | Freq: Two times a day (BID) | ORAL | Status: DC

## 2013-09-28 ENCOUNTER — Ambulatory Visit (INDEPENDENT_AMBULATORY_CARE_PROVIDER_SITE_OTHER): Payer: BC Managed Care – PPO

## 2013-09-28 ENCOUNTER — Ambulatory Visit (INDEPENDENT_AMBULATORY_CARE_PROVIDER_SITE_OTHER): Payer: BC Managed Care – PPO | Admitting: Family Medicine

## 2013-09-28 VITALS — BP 118/80 | HR 93 | Temp 97.9°F | Resp 16 | Ht 62.75 in | Wt 150.0 lb

## 2013-09-28 DIAGNOSIS — R590 Localized enlarged lymph nodes: Secondary | ICD-10-CM

## 2013-09-28 DIAGNOSIS — J029 Acute pharyngitis, unspecified: Secondary | ICD-10-CM

## 2013-09-28 DIAGNOSIS — D7589 Other specified diseases of blood and blood-forming organs: Secondary | ICD-10-CM

## 2013-09-28 DIAGNOSIS — R5381 Other malaise: Secondary | ICD-10-CM

## 2013-09-28 DIAGNOSIS — R599 Enlarged lymph nodes, unspecified: Secondary | ICD-10-CM

## 2013-09-28 DIAGNOSIS — R5383 Other fatigue: Secondary | ICD-10-CM

## 2013-09-28 LAB — POCT CBC
GRANULOCYTE PERCENT: 73.5 % (ref 37–80)
HEMATOCRIT: 42.8 % (ref 37.7–47.9)
Hemoglobin: 14.5 g/dL (ref 12.2–16.2)
LYMPH, POC: 2 (ref 0.6–3.4)
MCH, POC: 34.1 pg — AB (ref 27–31.2)
MCHC: 33.9 g/dL (ref 31.8–35.4)
MCV: 100.5 fL — AB (ref 80–97)
MID (CBC): 1.2 — AB (ref 0–0.9)
MPV: 8.8 fL (ref 0–99.8)
PLATELET COUNT, POC: 184 10*3/uL (ref 142–424)
POC Granulocyte: 8.9 — AB (ref 2–6.9)
POC LYMPH %: 16.3 % (ref 10–50)
POC MID %: 10.2 %M (ref 0–12)
RBC: 4.26 M/uL (ref 4.04–5.48)
RDW, POC: 13.1 %
WBC: 12.1 10*3/uL — AB (ref 4.6–10.2)

## 2013-09-28 LAB — COMPREHENSIVE METABOLIC PANEL
ALBUMIN: 4.5 g/dL (ref 3.5–5.2)
ALT: 14 U/L (ref 0–35)
AST: 16 U/L (ref 0–37)
Alkaline Phosphatase: 51 U/L (ref 39–117)
BUN: 12 mg/dL (ref 6–23)
CALCIUM: 9.2 mg/dL (ref 8.4–10.5)
CHLORIDE: 104 meq/L (ref 96–112)
CO2: 27 meq/L (ref 19–32)
Creat: 0.6 mg/dL (ref 0.50–1.10)
Glucose, Bld: 73 mg/dL (ref 70–99)
POTASSIUM: 4.4 meq/L (ref 3.5–5.3)
Sodium: 138 mEq/L (ref 135–145)
TOTAL PROTEIN: 7.1 g/dL (ref 6.0–8.3)
Total Bilirubin: 0.7 mg/dL (ref 0.2–1.2)

## 2013-09-28 LAB — C-REACTIVE PROTEIN: CRP: 2.6 mg/dL — AB (ref <0.60)

## 2013-09-28 LAB — POCT RAPID STREP A (OFFICE): Rapid Strep A Screen: NEGATIVE

## 2013-09-28 LAB — POCT SEDIMENTATION RATE: POCT SED RATE: 17 mm/hr (ref 0–22)

## 2013-09-28 MED ORDER — MAGIC MOUTHWASH W/LIDOCAINE: 10.0000 mL | ORAL | Status: DC | PRN

## 2013-09-28 MED ORDER — AMOXICILLIN-POT CLAVULANATE 875-125 MG PO TABS: 1.0000 | ORAL_TABLET | Freq: Two times a day (BID) | ORAL | Status: DC

## 2013-09-28 NOTE — Patient Instructions (Signed)
Cervical Adenitis °You have a swollen lymph gland in your neck. This commonly happens with Strep and virus infections, dental problems, insect bites, and injuries about the face, scalp, or neck. The lymph glands swell as the body fights the infection or heals the injury. Swelling and firmness typically lasts for several weeks after the infection or injury is healed. Rarely lymph glands can become swollen because of cancer or TB. °Antibiotics are prescribed if there is evidence of an infection. Sometimes an infected lymph gland becomes filled with pus. This condition may require opening up the abscessed gland by draining it surgically. Most of the time infected glands return to normal within two weeks. Do not poke or squeeze the swollen lymph nodes. That may keep them from shrinking back to their normal size. If the lymph gland is still swollen after 2 weeks, further medical evaluation is needed.  °SEEK IMMEDIATE MEDICAL CARE IF:  °You have difficulty swallowing or breathing, increased swelling, severe pain, or a high fever.  °Document Released: 02/16/2005 Document Revised: 05/11/2011 Document Reviewed: 08/08/2006 °ExitCare® Patient Information ©2015 [employer]. This information is not intended to replace advice given to you by your health care provider. Make sure you discuss any questions you have with your health care provider. ° °Lymphadenopathy °Lymphadenopathy means "disease of the lymph glands." But the term is usually used to describe swollen or enlarged lymph glands, also called lymph nodes. These are the bean-shaped organs found in many locations including the neck, underarm, and groin. Lymph glands are part of the immune system, which fights infections in your body. Lymphadenopathy can occur in just one area of the body, such as the neck, or it can be generalized, with lymph node enlargement in several areas. The nodes found in the neck are the most common sites of lymphadenopathy. °CAUSES °When your  immune system responds to germs (such as viruses or bacteria ), infection-fighting cells and fluid build up. This causes the glands to grow in size. Usually, this is not something to worry about. Sometimes, the glands themselves can become infected and inflamed. This is called lymphadenitis. °Enlarged lymph nodes can be caused by many diseases: °· Bacterial disease, such as strep throat or a skin infection. °· Viral disease, such as a common cold. °· Other germs, such as Lyme disease, tuberculosis, or sexually transmitted diseases. °· Cancers, such as lymphoma (cancer of the lymphatic system) or leukemia (cancer of the white blood cells). °· Inflammatory diseases such as lupus or rheumatoid arthritis. °· Reactions to medications. °Many of the diseases above are rare, but important. This is why you should see your caregiver if you have lymphadenopathy. °SYMPTOMS °· Swollen, enlarged lumps in the neck, back of the head, or other locations. °· Tenderness. °· Warmth or redness of the skin over the lymph nodes. °· Fever. °DIAGNOSIS °Enlarged lymph nodes are often near the source of infection. They can help health care providers diagnose your illness. For instance: °· Swollen lymph nodes around the jaw might be caused by an infection in the mouth. °· Enlarged glands in the neck often signal a throat infection. °· Lymph nodes that are swollen in more than one area often indicate an illness caused by a virus. °Your caregiver will likely know what is causing your lymphadenopathy after listening to your history and examining you. Blood tests, x-rays, or other tests may be needed. If the cause of the enlarged lymph node cannot be found, and it does not go away by itself, then a biopsy may   be needed. Your caregiver will discuss this with you. °TREATMENT °Treatment for your enlarged lymph nodes will depend on the cause. Many times the nodes will shrink to normal size by themselves, with no treatment. Antibiotics or other  medicines may be needed for infection. Only take over-the-counter or prescription medicines for pain, discomfort, or fever as directed by your caregiver. °HOME CARE INSTRUCTIONS °Swollen lymph glands usually return to normal when the underlying medical condition goes away. If they persist, contact your health-care provider. He/she might prescribe antibiotics or other treatments, depending on the diagnosis. Take any medications exactly as prescribed. Keep any follow-up appointments made to check on the condition of your enlarged nodes. °SEEK MEDICAL CARE IF: °· Swelling lasts for more than two weeks. °· You have symptoms such as weight loss, night sweats, fatigue, or fever that does not go away. °· The lymph nodes are hard, seem fixed to the skin, or are growing rapidly. °· Skin over the lymph nodes is red and inflamed. This could mean there is an infection. °SEEK IMMEDIATE MEDICAL CARE IF: °· Fluid starts leaking from the area of the enlarged lymph node. °· You develop a fever of 102° F (38.9° C) or greater. °· Severe pain develops (not necessarily at the site of a large lymph node). °· You develop chest pain or shortness of breath. °· You develop worsening abdominal pain. °MAKE SURE YOU: °· Understand these instructions. °· Will watch your condition. °· Will get help right away if you are not doing well or get worse. °Document Released: 11/26/2007 Document Revised: 07/03/2013 Document Reviewed: 11/26/2007 °ExitCare® Patient Information ©2015 [employer]. This information is not intended to replace advice given to you by your health care provider. Make sure you discuss any questions you have with your health care provider. ° °

## 2013-09-28 NOTE — Progress Notes (Signed)
Subjective:    Patient ID: Clarice Pole, female    DOB: 26-Jan-1981, 33 y.o.   MRN: [id number] Chief Complaint  Patient presents with  . Adenopathy    noticed last week, has a history of swollen lymph nodes  . difficulty swallowing    sore throat, started last week, no relief from salt water   . Headache  . Fatigue    HPI  Had similar swollen lymphnodes a pharyngitis x 2 wks but gone for about a month but suddenly came back 2d ago but now worse - waking her from sleep and not going away during the day like it did previously.  Feels like throat is swollen and hypersensitive. Has been really fatigued for a few months now.  Had cmp w/ PCP. Called out of work.  No f/c. 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 Immune system down - was having recurrent BV then abnormal pap with +HPV  - h/o LEEP 12 yrs prev.  Past Medical History  Diagnosis Date  . Anemia   . Asthma   . Depression   . Peptic ulcer disease   . Headache(784.0)   . ADHD (attention deficit hyperactivity disorder)    Current Outpatient Prescriptions on File Prior to Visit  Medication Sig Dispense Refill  . ALPRAZolam (XANAX) 0.5 MG tablet Take 1 tablet (0.5 mg total) by mouth 2 (two) times daily.  60 tablet  2  . amphetamine-dextroamphetamine (ADDERALL XR) 30 MG 24 hr capsule Once a day in the morning  30 capsule  0  . amphetamine-dextroamphetamine (ADDERALL) 30 MG tablet Once a day in the evenings  30 tablet  0  . Multiple Vitamins-Minerals (WOMENS ONE DAILY PO) Take 1 tablet by mouth daily.         No current facility-administered medications on file prior to visit.   No Known Allergies  Review of Systems  Constitutional: Positive for appetite change and fatigue. Negative for fever, chills, diaphoresis, activity change and unexpected weight change.  HENT: Positive for sore throat, trouble swallowing and voice change.   Neurological: Positive for headaches.  Hematological:  Positive for adenopathy. Does not bruise/bleed easily.  Psychiatric/Behavioral: Positive for sleep disturbance.      BP 118/80  Pulse 93  Temp(Src) 97.9 F (36.6 C) (Oral)  Resp 16  Ht 5' 2.75" (1.594 m)  Wt 150 lb (68.04 kg)  BMI 26.78 kg/m2  SpO2 98% Objective:   Physical Exam  Constitutional: She is oriented to person, place, and time. She appears well-developed and well-nourished. No distress.  HENT:  Head: Normocephalic and atraumatic.  Right Ear: Tympanic membrane, external ear and ear canal normal.  Left Ear: Tympanic membrane, external ear and ear canal normal.  Nose: Nose normal. No mucosal edema or rhinorrhea.  Mouth/Throat: Uvula is midline and mucous membranes are normal. Mucous membranes are not pale and not dry. No trismus in the jaw. No uvula swelling. Oropharyngeal exudate, posterior oropharyngeal edema and posterior oropharyngeal erythema present. No tonsillar abscesses.  Eyes: Conjunctivae are normal. Right eye exhibits no discharge. Left eye exhibits no discharge. No scleral icterus.  Neck: Normal range of motion. Neck supple.  Cardiovascular: Normal rate, regular rhythm, normal heart sounds and intact distal pulses.   Pulmonary/Chest: Effort normal and breath sounds normal. No respiratory distress.  Lymphadenopathy:       Head (right side): Submandibular and tonsillar adenopathy present. No preauricular, no posterior auricular and no occipital adenopathy present.  Head (left side): Submandibular and tonsillar adenopathy present. No preauricular, no posterior auricular and no occipital adenopathy present.    She has cervical adenopathy.       Right cervical: Superficial cervical adenopathy present. No posterior cervical adenopathy present.      Left cervical: No posterior cervical adenopathy present.       Right: No supraclavicular adenopathy present.       Left: No supraclavicular adenopathy present.  Neurological: She is alert and oriented to person, place,  and time.  Skin: Skin is warm and dry. She is not diaphoretic. No erythema.  Psychiatric: She has a normal mood and affect. Her behavior is normal.          Results for orders placed in visit on 09/28/13  POCT RAPID STREP A (OFFICE)      Result Value Ref Range   Rapid Strep A Screen Negative  Negative  POCT CBC      Result Value Ref Range   WBC 12.1 (*) 4.6 - 10.2 K/uL   Lymph, poc 2.0  0.6 - 3.4   POC LYMPH PERCENT 16.3  10 - 50 %L   MID (cbc) 1.2 (*) 0 - 0.9   POC MID % 10.2  0 - 12 %M   POC Granulocyte 8.9 (*) 2 - 6.9   Granulocyte percent 73.5  37 - 80 %G   RBC 4.26  4.04 - 5.48 M/uL   Hemoglobin 14.5  12.2 - 16.2 g/dL   HCT, POC 42.8  37.7 - 47.9 %   MCV 100.5 (*) 80 - 97 fL   MCH, POC 34.1 (*) 27 - 31.2 pg   MCHC 33.9  31.8 - 35.4 g/dL   RDW, POC 13.1     Platelet Count, POC 184  142 - 424 K/uL   MPV 8.8  0 - 99.8 fL   [facility] reading (PRIMARY) by  Dr. Brigitte Pulse. CXR: normal. Assessment & Plan:   Adenopathy, cervical - Plan: POCT CBC, POCT SEDIMENTATION RATE, Epstein-Barr virus VCA antibody panel, C-reactive protein, Comprehensive metabolic panel, T3, free, T4, free, TSH  Other malaise and fatigue - Plan: POCT CBC, POCT SEDIMENTATION RATE, Epstein-Barr virus VCA antibody panel, C-reactive protein, Comprehensive metabolic panel, T3, free, T4, free, TSH, Vit D  25 hydroxy (rtn osteoporosis monitoring), Vitamin B12, Folate  Acute pharyngitis, unspecified pharyngitis type - Plan: POCT rapid strep A, POCT CBC, POCT SEDIMENTATION RATE, Epstein-Barr virus VCA antibody panel, C-reactive protein, Comprehensive metabolic panel, T3, free, T4, free, TSH, DG Chest 2 View, Culture, Group A Strep  Macrocytosis without anemia - Plan: Vitamin B12, Folate  Meds ordered this encounter  Medications  . amoxicillin-clavulanate (AUGMENTIN) 875-125 MG per tablet    Sig: Take 1 tablet by mouth 2 (two) times daily.    Dispense:  20 tablet    Refill:  0  . Alum & Mag Hydroxide-Simeth (MAGIC  MOUTHWASH W/LIDOCAINE) SOLN    Sig: Take 10 mLs by mouth every 2 (two) hours as needed for mouth pain.    Dispense:  360 mL    Refill:  0    Use pharmacy formulary and mix 1:1 w/ lidocaine    Delman Cheadle, MD MPH

## 2013-09-29 LAB — EPSTEIN-BARR VIRUS VCA ANTIBODY PANEL
EBV EA IgG: <5 U/mL (ref <9.0)
EBV VCA IgG: 427 U/mL — ABNORMAL HIGH (ref <18.0)
EBV VCA IgM: <10 U/mL (ref <36.0)

## 2013-09-29 LAB — T3, FREE: T3, Free: 2.5 pg/mL (ref 2.3–4.2)

## 2013-09-29 LAB — VITAMIN D 25 HYDROXY (VIT D DEFICIENCY, FRACTURES): Vit D, 25-Hydroxy: 54 ng/mL (ref 30–89)

## 2013-09-29 LAB — TSH: TSH: 1.857 u[IU]/mL (ref 0.350–4.500)

## 2013-09-29 LAB — VITAMIN B12: Vitamin B-12: 371 pg/mL (ref 211–911)

## 2013-09-29 LAB — FOLATE: Folate: 19.4 ng/mL

## 2013-09-29 LAB — T4, FREE: Free T4: 1.03 ng/dL (ref 0.80–1.80)

## 2013-09-30 LAB — CULTURE, GROUP A STREP: Organism ID, Bacteria: NORMAL

## 2013-10-02 ENCOUNTER — Encounter: Payer: Self-pay | Admitting: Family Medicine

## 2013-10-12 ENCOUNTER — Telehealth: Payer: Self-pay | Admitting: Family Medicine

## 2013-10-12 MED ORDER — AMPHETAMINE-DEXTROAMPHETAMINE 30 MG PO TABS: ORAL_TABLET | ORAL | Status: DC

## 2013-10-12 MED ORDER — AMPHETAMINE-DEXTROAMPHET ER 30 MG PO CP24: ORAL_CAPSULE | ORAL | Status: DC

## 2013-10-12 NOTE — Telephone Encounter (Signed)
done

## 2013-10-13 NOTE — Telephone Encounter (Signed)
I left a voice message for pt. Scripts will be ready on Monday 10/16/13, one of the scripts is missing a signature, all others are ready.

## 2014-02-05 ENCOUNTER — Telehealth: Payer: Self-pay | Admitting: Family Medicine

## 2014-02-08 MED ORDER — AMPHETAMINE-DEXTROAMPHET ER 30 MG PO CP24: ORAL_CAPSULE | ORAL | Status: DC

## 2014-02-08 MED ORDER — AMPHETAMINE-DEXTROAMPHETAMINE 30 MG PO TABS: ORAL_TABLET | ORAL | Status: DC

## 2014-02-08 MED ORDER — ALPRAZOLAM 0.5 MG PO TABS: 0.5000 mg | ORAL_TABLET | Freq: Two times a day (BID) | ORAL | Status: DC

## 2014-02-08 NOTE — Telephone Encounter (Signed)
Scripts ready for pick up, Xanax was called in and I left a voice message for pt.

## 2014-02-08 NOTE — Telephone Encounter (Signed)
Done, also call in Xanax #[phone] with 2 rf

## 2014-06-12 ENCOUNTER — Telehealth: Payer: Self-pay | Admitting: Family Medicine

## 2014-06-12 MED ORDER — ALPRAZOLAM 0.5 MG PO TABS: 0.5000 mg | ORAL_TABLET | Freq: Two times a day (BID) | ORAL | Status: DC

## 2014-06-12 MED ORDER — AMPHETAMINE-DEXTROAMPHET ER 30 MG PO CP24: ORAL_CAPSULE | ORAL | Status: DC

## 2014-06-12 MED ORDER — AMPHETAMINE-DEXTROAMPHETAMINE 30 MG PO TABS: ORAL_TABLET | ORAL | Status: DC

## 2014-06-12 NOTE — Telephone Encounter (Signed)
Pt request refill of the following: amphetamine-dextroamphetamine (ADDERALL XR) 30 MG 24 hr capsule, amphetamine-dextroamphetamine (ADDERALL) 30 MG tablet,  ALPRAZolam (XANAX) 0.5 MG tablet    Phamacy:

## 2014-06-12 NOTE — Telephone Encounter (Signed)
These were refilled for ONE month only. She needs an OV after that

## 2014-06-13 NOTE — Telephone Encounter (Signed)
Scripts are ready for pick up, left a voice message with below information.

## 2014-06-29 ENCOUNTER — Encounter: Payer: Self-pay | Admitting: Family Medicine

## 2014-06-29 ENCOUNTER — Ambulatory Visit (INDEPENDENT_AMBULATORY_CARE_PROVIDER_SITE_OTHER): Payer: BLUE CROSS/BLUE SHIELD | Admitting: Family Medicine

## 2014-06-29 VITALS — BP 110/83 | HR 92 | Temp 99.1°F | Ht 62.75 in | Wt 165.0 lb

## 2014-06-29 DIAGNOSIS — G4700 Insomnia, unspecified: Secondary | ICD-10-CM

## 2014-06-29 DIAGNOSIS — F900 Attention-deficit hyperactivity disorder, predominantly inattentive type: Secondary | ICD-10-CM

## 2014-06-29 DIAGNOSIS — R5382 Chronic fatigue, unspecified: Secondary | ICD-10-CM | POA: Diagnosis not present

## 2014-06-29 LAB — CBC WITH DIFFERENTIAL/PLATELET
Basophils Absolute: 0 10*3/uL (ref 0.0–0.1)
Basophils Relative: 0.3 % (ref 0.0–3.0)
EOS ABS: 0.1 10*3/uL (ref 0.0–0.7)
EOS PCT: 1.1 % (ref 0.0–5.0)
HEMATOCRIT: 43.6 % (ref 36.0–46.0)
Hemoglobin: 15.1 g/dL — ABNORMAL HIGH (ref 12.0–15.0)
LYMPHS PCT: 17.1 % (ref 12.0–46.0)
Lymphs Abs: 1.4 10*3/uL (ref 0.7–4.0)
MCHC: 34.5 g/dL (ref 30.0–36.0)
MCV: 95.7 fl (ref 78.0–100.0)
MONO ABS: 0.4 10*3/uL (ref 0.1–1.0)
Monocytes Relative: 5 % (ref 3.0–12.0)
NEUTROS ABS: 6.2 10*3/uL (ref 1.4–7.7)
NEUTROS PCT: 76.5 % (ref 43.0–77.0)
Platelets: 193 10*3/uL (ref 150.0–400.0)
RBC: 4.56 Mil/uL (ref 3.87–5.11)
RDW: 13 % (ref 11.5–15.5)
WBC: 8.1 10*3/uL (ref 4.0–10.5)

## 2014-06-29 LAB — BASIC METABOLIC PANEL
BUN: 14 mg/dL (ref 6–23)
CHLORIDE: 105 meq/L (ref 96–112)
CO2: 24 mEq/L (ref 19–32)
CREATININE: 0.77 mg/dL (ref 0.40–1.20)
Calcium: 9.8 mg/dL (ref 8.4–10.5)
GFR: 91.7 mL/min (ref >60.00)
GLUCOSE: 71 mg/dL (ref 70–99)
POTASSIUM: 4.2 meq/L (ref 3.5–5.1)
SODIUM: 139 meq/L (ref 135–145)

## 2014-06-29 LAB — HEPATIC FUNCTION PANEL
ALK PHOS: 49 U/L (ref 39–117)
ALT: 18 U/L (ref 0–35)
AST: 18 U/L (ref 0–37)
Albumin: 4.6 g/dL (ref 3.5–5.2)
Bilirubin, Direct: 0.1 mg/dL (ref 0.0–0.3)
Total Bilirubin: 0.6 mg/dL (ref 0.2–1.2)
Total Protein: 7.1 g/dL (ref 6.0–8.3)

## 2014-06-29 LAB — POCT URINALYSIS DIPSTICK
Bilirubin, UA: NEGATIVE
Blood, UA: NEGATIVE
Glucose, UA: NEGATIVE
KETONES UA: NEGATIVE
Leukocytes, UA: NEGATIVE
Nitrite, UA: NEGATIVE
Protein, UA: NEGATIVE
Urobilinogen, UA: 2
pH, UA: 5

## 2014-06-29 LAB — FERRITIN: Ferritin: 50.7 ng/mL (ref 10.0–291.0)

## 2014-06-29 LAB — VITAMIN B12: VITAMIN B 12: 354 pg/mL (ref 211–911)

## 2014-06-29 LAB — RHEUMATOID FACTOR

## 2014-06-29 LAB — TSH: TSH: 1.26 u[IU]/mL (ref 0.35–4.50)

## 2014-06-29 LAB — SEDIMENTATION RATE: Sed Rate: 8 mm/hr (ref 0–22)

## 2014-06-29 MED ORDER — AMPHETAMINE-DEXTROAMPHETAMINE 30 MG PO TABS: ORAL_TABLET | ORAL | Status: DC

## 2014-06-29 MED ORDER — ALPRAZOLAM 0.5 MG PO TABS: 0.5000 mg | ORAL_TABLET | Freq: Two times a day (BID) | ORAL | Status: DC

## 2014-06-29 MED ORDER — AMPHETAMINE-DEXTROAMPHET ER 30 MG PO CP24: ORAL_CAPSULE | ORAL | Status: DC

## 2014-06-29 NOTE — Progress Notes (Signed)
Pre visit review using our clinic review tool, if applicable. No additional management support is needed unless otherwise documented below in the visit note. 

## 2014-06-29 NOTE — Progress Notes (Signed)
   Subjective:    Patient ID: Alison Cervantes, female    DOB: [birth year], 34 y.o.   MRN: [id number]  HPI Here to follow up on meds and with other issues. She is happy with the Adderall and wants to stay on the same regimen. However she describes daily fatigue that waxes and wanes. This started about a year ago, and she had an extensive workup last July. This included labs and a CXR, all of which were normal. She feels very tired all the time. She sleeps well and her weight has been stable. Her moods are stable, and she took herself off prozac last year. She also gets intermittent rashes over the arms and trunk. She has intermittent joint pains, especially in the hands. Her hands swell at times. She does not have menses since she has an IUD, which was placed 3 years ago.    Review of Systems  Constitutional: Positive for fatigue. Negative for activity change, appetite change and unexpected weight change.  Respiratory: Negative.   Cardiovascular: Negative.   Gastrointestinal: Negative.   Genitourinary: Negative.   Neurological: Negative.        Objective:   Physical Exam  Constitutional: She is oriented to person, place, and time. She appears well-developed and well-nourished.  Neck: No thyromegaly present.  Cardiovascular: Normal rate, regular rhythm, normal heart sounds and intact distal pulses.   Pulmonary/Chest: Effort normal and breath sounds normal.  Lymphadenopathy:    She has no cervical adenopathy.  Neurological: She is alert and oriented to person, place, and time.  Psychiatric: She has a normal mood and affect. Her behavior is normal. Thought content normal.          Assessment & Plan:  We refilled her meds. Get labs to evaluate her chronic fatigue.

## 2014-07-02 LAB — ANA: ANA: NEGATIVE

## 2014-11-02 ENCOUNTER — Telehealth: Payer: Self-pay

## 2014-11-02 MED ORDER — AMPHETAMINE-DEXTROAMPHETAMINE 30 MG PO TABS: ORAL_TABLET | ORAL | Status: DC

## 2014-11-02 MED ORDER — AMPHETAMINE-DEXTROAMPHET ER 30 MG PO CP24: ORAL_CAPSULE | ORAL | Status: DC

## 2014-11-02 NOTE — Telephone Encounter (Signed)
Scripts are ready for pick up and I left a voice message for pt. 

## 2014-11-02 NOTE — Telephone Encounter (Signed)
The pt called hoping to get both of her adderall rx refilled  Callback - [phone]

## 2014-11-02 NOTE — Telephone Encounter (Signed)
done

## 2015-02-13 ENCOUNTER — Other Ambulatory Visit: Payer: Self-pay | Admitting: Family Medicine

## 2015-02-14 ENCOUNTER — Other Ambulatory Visit: Payer: Self-pay | Admitting: Family Medicine

## 2015-02-14 NOTE — Telephone Encounter (Signed)
Call in #[phone] with 5 rf 

## 2015-02-18 ENCOUNTER — Telehealth: Payer: Self-pay | Admitting: Family Medicine

## 2015-02-18 ENCOUNTER — Other Ambulatory Visit: Payer: Self-pay | Admitting: Family Medicine

## 2015-02-18 NOTE — Telephone Encounter (Signed)
As per Dr Sarajane Jews its ok for pt to been seen tomorrow

## 2015-02-18 NOTE — Telephone Encounter (Signed)
Caller states, suicidal thoughts, history of depression. Pt has an appt with you tomorrow please advise

## 2015-02-18 NOTE — Telephone Encounter (Signed)
It sounds like she is stable right now. We will see her at the [facility] tomorrow.

## 2015-02-18 NOTE — Telephone Encounter (Signed)
Patient Name: Alison Cervantes  DOB: [birth year]    Initial Comment Caller states, suicidal thoughts, history of depression, wanted to make an appt w/ Dr to get back on her Rx    Nurse Assessment  Nurse: Leilani Merl, RN, Nira Conn Date/Time ([place] Time): 02/18/2015 12:34:13 PM  Confirm and document reason for call. If symptomatic, describe symptoms. ---caller states that she is having more trouble with depression lately, she is not on any medication right now. She is not suicidal or homicidal right now.  Has the patient traveled out of the country within the last 30 days? ---Not Applicable  Does the patient have any new or worsening symptoms? ---Yes  Will a triage be completed? ---Yes  Related visit to physician within the last 2 weeks? ---No  Does the PT have any chronic conditions? (i.e. diabetes, asthma, etc.) ---Yes  List chronic conditions. ---depression, ADD  Did the patient indicate they were pregnant? ---No  Is this a behavioral health or substance abuse call? ---Yes  Are you having any thoughts or feelings of harming or killing yourself or someone else? ---No  Are you currently experiencing any physical discomfort that you think may be related to the use of alcohol or other drugs? (use substance abuse or alcohol abuse guidelines. These include withdrawal symptoms) ---No  Do you worry that you may be hearing or seeing things that others do not? ---No  Do you take medications for your condition(s)? ---No     Guidelines    Guideline Title Affirmed Question Affirmed Notes  Depression [1] Depression AND [2] worsening (e.g.,sleeping poorly, less able to do activities of daily living)    Final Disposition User   See Physician within 24 Hours Stand[place], RN, Water quality scientist    Referrals  REFERRED TO PCP OFFICE   Disagree/Comply: Comply

## 2015-02-19 ENCOUNTER — Ambulatory Visit (INDEPENDENT_AMBULATORY_CARE_PROVIDER_SITE_OTHER): Payer: BLUE CROSS/BLUE SHIELD | Admitting: Family Medicine

## 2015-02-19 ENCOUNTER — Encounter: Payer: Self-pay | Admitting: Family Medicine

## 2015-02-19 ENCOUNTER — Ambulatory Visit: Payer: BLUE CROSS/BLUE SHIELD | Admitting: Family Medicine

## 2015-02-19 VITALS — BP 123/93 | HR 99 | Temp 98.4°F

## 2015-02-19 DIAGNOSIS — F329 Major depressive disorder, single episode, unspecified: Secondary | ICD-10-CM | POA: Diagnosis not present

## 2015-02-19 DIAGNOSIS — F900 Attention-deficit hyperactivity disorder, predominantly inattentive type: Secondary | ICD-10-CM

## 2015-02-19 DIAGNOSIS — F32A Depression, unspecified: Secondary | ICD-10-CM

## 2015-02-19 DIAGNOSIS — Z23 Encounter for immunization: Secondary | ICD-10-CM

## 2015-02-19 MED ORDER — BUPROPION HCL ER (XL) 150 MG PO TB24: 150.0000 mg | ORAL_TABLET | Freq: Every day | ORAL | Status: DC

## 2015-02-19 MED ORDER — AMPHETAMINE-DEXTROAMPHETAMINE 30 MG PO TABS: ORAL_TABLET | ORAL | Status: DC

## 2015-02-19 MED ORDER — AMPHETAMINE-DEXTROAMPHET ER 30 MG PO CP24: ORAL_CAPSULE | ORAL | Status: DC

## 2015-02-19 MED ORDER — AMPHETAMINE-DEXTROAMPHETAMINE 30 MG PO TABS
ORAL_TABLET | ORAL | Status: DC
Start: 2015-02-19 — End: 2015-02-19

## 2015-02-19 NOTE — Progress Notes (Signed)
Pre visit review using our clinic review tool, if applicable. No additional management support is needed unless otherwise documented below in the visit note. Pt declined to weigh 

## 2015-02-19 NOTE — Progress Notes (Signed)
   Subjective:    Patient ID: Alison Cervantes, female    DOB: May 27, 1980, 34 y.o.   MRN: [id number]  HPI Here to discuss her depression. She has been very depressed for several weeks and has had some suicidal thoughts. She denies any intent or plan however. She feels sad often although she says things are going fairly well in her life. Her anxiety is well controlled and she is happy with how th Adderall helps her ADHD. She had a problem with alcohol abuse last year but this is under control. She says she only has a few alcoholic drinks in the course of a week now.   Review of Systems  Constitutional: Negative.   Respiratory: Negative.   Cardiovascular: Negative.   Neurological: Negative.   Psychiatric/Behavioral: Positive for dysphoric mood and decreased concentration. Negative for hallucinations and confusion. The patient is nervous/anxious.        Objective:   Physical Exam  Constitutional: She is oriented to person, place, and time. She appears well-developed and well-nourished.  Cardiovascular: Normal rate, regular rhythm, normal heart sounds and intact distal pulses.   Pulmonary/Chest: Effort normal and breath sounds normal.  Neurological: She is alert and oriented to person, place, and time.  Psychiatric: She has a normal mood and affect. Her behavior is normal. Judgment and thought content normal.          Assessment & Plan:  For the depression she will try Wellbutrin XL 150 mg daily again. Refilled her Adderall for the ADHD. Recheck one month. We spent 35 minutes discussing these issues today.

## 2015-06-09 ENCOUNTER — Other Ambulatory Visit: Payer: Self-pay | Admitting: Family Medicine

## 2015-06-10 NOTE — Telephone Encounter (Signed)
Dr Sarajane Jews is out of office this week .  Last visit in dec  2016 advised  Fu visit in 1 month . Refill  Request denied as she needs to make appt   With dr Sarajane Jews  For refill

## 2015-06-11 ENCOUNTER — Telehealth: Payer: Self-pay | Admitting: Family Medicine

## 2015-06-11 NOTE — Telephone Encounter (Signed)
Pt needs a office visit  

## 2015-06-11 NOTE — Telephone Encounter (Signed)
Pt request refill of the following: amphetamine-dextroamphetamine (ADDERALL XR) 30 MG 24 hr capsule ,  amphetamine-dextroamphetamine (ADDERALL) 30 MG tablet   Phamacy:

## 2015-06-12 NOTE — Telephone Encounter (Signed)
Pt scheduled  

## 2015-06-18 ENCOUNTER — Encounter: Payer: Self-pay | Admitting: Family Medicine

## 2015-06-18 ENCOUNTER — Ambulatory Visit (INDEPENDENT_AMBULATORY_CARE_PROVIDER_SITE_OTHER): Payer: BLUE CROSS/BLUE SHIELD | Admitting: Family Medicine

## 2015-06-18 VITALS — BP 117/84 | HR 82 | Temp 98.4°F | Ht 62.75 in | Wt 173.0 lb

## 2015-06-18 DIAGNOSIS — R739 Hyperglycemia, unspecified: Secondary | ICD-10-CM | POA: Diagnosis not present

## 2015-06-18 DIAGNOSIS — F32A Depression, unspecified: Secondary | ICD-10-CM

## 2015-06-18 DIAGNOSIS — F1010 Alcohol abuse, uncomplicated: Secondary | ICD-10-CM | POA: Diagnosis not present

## 2015-06-18 DIAGNOSIS — F900 Attention-deficit hyperactivity disorder, predominantly inattentive type: Secondary | ICD-10-CM

## 2015-06-18 DIAGNOSIS — F329 Major depressive disorder, single episode, unspecified: Secondary | ICD-10-CM

## 2015-06-18 LAB — HEPATIC FUNCTION PANEL
ALBUMIN: 4.8 g/dL (ref 3.5–5.2)
ALT: 27 U/L (ref 0–35)
AST: 23 U/L (ref 0–37)
Alkaline Phosphatase: 48 U/L (ref 39–117)
Bilirubin, Direct: 0.1 mg/dL (ref 0.0–0.3)
TOTAL PROTEIN: 7.3 g/dL (ref 6.0–8.3)
Total Bilirubin: 0.8 mg/dL (ref 0.2–1.2)

## 2015-06-18 LAB — CBC WITH DIFFERENTIAL/PLATELET
BASOS ABS: 0.1 10*3/uL (ref 0.0–0.1)
Basophils Relative: 0.9 % (ref 0.0–3.0)
EOS ABS: 0.2 10*3/uL (ref 0.0–0.7)
Eosinophils Relative: 2.1 % (ref 0.0–5.0)
HCT: 42.6 % (ref 36.0–46.0)
HEMOGLOBIN: 14.5 g/dL (ref 12.0–15.0)
LYMPHS PCT: 24.9 % (ref 12.0–46.0)
Lymphs Abs: 2.2 10*3/uL (ref 0.7–4.0)
MCHC: 34 g/dL (ref 30.0–36.0)
MCV: 96 fl (ref 78.0–100.0)
MONO ABS: 0.5 10*3/uL (ref 0.1–1.0)
Monocytes Relative: 5.9 % (ref 3.0–12.0)
Neutro Abs: 5.8 10*3/uL (ref 1.4–7.7)
Neutrophils Relative %: 66.2 % (ref 43.0–77.0)
Platelets: 226 10*3/uL (ref 150.0–400.0)
RBC: 4.44 Mil/uL (ref 3.87–5.11)
RDW: 12.8 % (ref 11.5–15.5)
WBC: 8.8 10*3/uL (ref 4.0–10.5)

## 2015-06-18 LAB — LIPID PANEL
CHOLESTEROL: 216 mg/dL — AB (ref 0–200)
HDL: 57.3 mg/dL (ref >39.00)
LDL CALC: 137 mg/dL — AB (ref 0–99)
NonHDL: 158.82
TRIGLYCERIDES: 111 mg/dL (ref 0.0–149.0)
Total CHOL/HDL Ratio: 4
VLDL: 22.2 mg/dL (ref 0.0–40.0)

## 2015-06-18 LAB — HEMOGLOBIN A1C: HEMOGLOBIN A1C: 5.5 % (ref 4.6–6.5)

## 2015-06-18 LAB — BASIC METABOLIC PANEL
BUN: 14 mg/dL (ref 6–23)
CHLORIDE: 103 meq/L (ref 96–112)
CO2: 28 mEq/L (ref 19–32)
Calcium: 10.2 mg/dL (ref 8.4–10.5)
Creatinine, Ser: 0.68 mg/dL (ref 0.40–1.20)
GFR: 105.23 mL/min (ref >60.00)
GLUCOSE: 85 mg/dL (ref 70–99)
POTASSIUM: 4.1 meq/L (ref 3.5–5.1)
Sodium: 139 mEq/L (ref 135–145)

## 2015-06-18 LAB — TSH: TSH: 1.98 u[IU]/mL (ref 0.35–4.50)

## 2015-06-18 MED ORDER — AMPHETAMINE-DEXTROAMPHETAMINE 30 MG PO TABS: ORAL_TABLET | ORAL | Status: DC

## 2015-06-18 MED ORDER — LAMOTRIGINE 25 MG PO TABS: 25.0000 mg | ORAL_TABLET | Freq: Every day | ORAL | Status: DC

## 2015-06-18 MED ORDER — AMPHETAMINE-DEXTROAMPHET ER 30 MG PO CP24: ORAL_CAPSULE | ORAL | Status: DC

## 2015-06-18 MED ORDER — BUPROPION HCL ER (XL) 150 MG PO TB24: 150.0000 mg | ORAL_TABLET | Freq: Every day | ORAL | Status: DC

## 2015-06-18 NOTE — Progress Notes (Signed)
Pre visit review using our clinic review tool, if applicable. No additional management support is needed unless otherwise documented below in the visit note. 

## 2015-06-18 NOTE — Progress Notes (Signed)
   Subjective:    Patient ID: Alison Cervantes, female    DOB: 02/02/1981, 35 y.o.   MRN: [id number]  HPI Here to follow up on depression and ADHD. She has been doing fairly well over the past few months, and her depression is improved. She still has episodes of irritability and has flashes of anger almost daily. She sleeps well. She is proud to say she has totally stopped using alcohol and she has not had any alcohol for the past 3 months.    Review of Systems  Constitutional: Negative.   Respiratory: Negative.   Cardiovascular: Negative.   Neurological: Negative.   Psychiatric/Behavioral: Positive for agitation. Negative for confusion, sleep disturbance, dysphoric mood and decreased concentration. The patient is not nervous/anxious.        Objective:   Physical Exam  Constitutional: She is oriented to person, place, and time. She appears well-developed and well-nourished.  Cardiovascular: Normal rate, regular rhythm, normal heart sounds and intact distal pulses.   Pulmonary/Chest: Effort normal and breath sounds normal.  Neurological: She is alert and oriented to person, place, and time.  Psychiatric: She has a normal mood and affect. Her behavior is normal. Thought content normal.          Assessment & Plan:  Her depression is improved but for the mood swings she will try Lamictal 25 mg at bedtime daily. Refilled the other meds. The ADHD is stable.  Laurey Morale, MD

## 2015-07-17 ENCOUNTER — Telehealth: Payer: Self-pay | Admitting: Family Medicine

## 2015-07-17 NOTE — Telephone Encounter (Signed)
Pharm is calling to requesting refilling the adderall one day early

## 2015-07-17 NOTE — Telephone Encounter (Signed)
Per Dr. Sarajane Jews okay to fill and I spoke with pharmacy and gave this information.

## 2015-09-12 ENCOUNTER — Telehealth: Payer: Self-pay | Admitting: Emergency Medicine

## 2015-09-12 MED ORDER — ALPRAZOLAM 0.5 MG PO TABS: 0.5000 mg | ORAL_TABLET | Freq: Two times a day (BID) | ORAL | Status: DC

## 2015-09-12 NOTE — Telephone Encounter (Signed)
Rx called in to pharmacy. 

## 2015-09-12 NOTE — Telephone Encounter (Signed)
Pt was last seen here in office on 06/18/2015.

## 2015-09-12 NOTE — Telephone Encounter (Signed)
Pt requesting refill on Alprazolam (XANAX) 0.5mg , 1 tablet twice a day, send to [facility].

## 2015-09-12 NOTE — Telephone Encounter (Signed)
Can refill x 1 disp 60   PCP out of office  Further refills per Dr Sarajane Jews PCP

## 2015-09-23 ENCOUNTER — Other Ambulatory Visit: Payer: Self-pay | Admitting: Family Medicine

## 2015-09-23 MED ORDER — AMPHETAMINE-DEXTROAMPHETAMINE 30 MG PO TABS: ORAL_TABLET | ORAL | 0 refills | Status: DC

## 2015-09-23 NOTE — Telephone Encounter (Signed)
done

## 2015-09-24 ENCOUNTER — Telehealth: Payer: Self-pay | Admitting: Family Medicine

## 2015-09-24 NOTE — Telephone Encounter (Signed)
Script is ready for pick up here at front office, tried to reach pt by phone and no answer.

## 2015-09-24 NOTE — Telephone Encounter (Signed)
Pt need new Rx for Adderall XR and Adderall

## 2015-09-24 NOTE — Telephone Encounter (Signed)
Duplicate phone note.

## 2015-09-25 MED ORDER — AMPHETAMINE-DEXTROAMPHET ER 30 MG PO CP24: ORAL_CAPSULE | ORAL | 0 refills | Status: DC

## 2015-09-25 NOTE — Telephone Encounter (Signed)
done

## 2015-09-25 NOTE — Telephone Encounter (Signed)
Pt is here now to pick up.  

## 2015-10-10 ENCOUNTER — Other Ambulatory Visit: Payer: Self-pay | Admitting: Internal Medicine

## 2015-10-11 NOTE — Telephone Encounter (Signed)
Call in #[phone] with 5 rf 

## 2015-10-24 ENCOUNTER — Other Ambulatory Visit: Payer: Self-pay | Admitting: Family Medicine

## 2015-10-25 ENCOUNTER — Encounter: Payer: Self-pay | Admitting: Genetic Counselor

## 2015-10-25 ENCOUNTER — Telehealth: Payer: Self-pay | Admitting: Genetic Counselor

## 2015-10-25 NOTE — Telephone Encounter (Signed)
Returned call and confirmed appt, completed intake, mailed new pt  letter, and faxed referring provider appt date/time

## 2015-12-03 ENCOUNTER — Encounter: Payer: Self-pay | Admitting: Genetic Counselor

## 2015-12-04 ENCOUNTER — Other Ambulatory Visit: Payer: BLUE CROSS/BLUE SHIELD

## 2015-12-04 ENCOUNTER — Ambulatory Visit (HOSPITAL_BASED_OUTPATIENT_CLINIC_OR_DEPARTMENT_OTHER): Payer: BLUE CROSS/BLUE SHIELD | Admitting: Genetic Counselor

## 2015-12-04 ENCOUNTER — Encounter: Payer: Self-pay | Admitting: Genetic Counselor

## 2015-12-04 DIAGNOSIS — Z8049 Family history of malignant neoplasm of other genital organs: Secondary | ICD-10-CM

## 2015-12-04 DIAGNOSIS — Z803 Family history of malignant neoplasm of breast: Secondary | ICD-10-CM | POA: Diagnosis not present

## 2015-12-04 DIAGNOSIS — Z8041 Family history of malignant neoplasm of ovary: Secondary | ICD-10-CM

## 2015-12-04 DIAGNOSIS — Z315 Encounter for genetic counseling: Secondary | ICD-10-CM

## 2015-12-04 DIAGNOSIS — Z808 Family history of malignant neoplasm of other organs or systems: Secondary | ICD-10-CM | POA: Insufficient documentation

## 2015-12-04 DIAGNOSIS — Z800 Family history of malignant neoplasm of digestive organs: Secondary | ICD-10-CM

## 2015-12-04 DIAGNOSIS — Z8042 Family history of malignant neoplasm of prostate: Secondary | ICD-10-CM

## 2015-12-04 NOTE — Progress Notes (Signed)
REFERRING PROVIDER: Laurey Morale, MD [place],  [id number]  Freda Munro, MD  PRIMARY PROVIDER:  Laurey Morale, MD  PRIMARY REASON FOR VISIT:  1. Family history of breast cancer   2. Family history of ovarian cancer   3. Family history of uterine cancer   4. Family history of colon cancer   5. Family history of prostate cancer   6. Family history of melanoma      HISTORY OF PRESENT ILLNESS:   Alison Cervantes, a 35 y.o. female, was seen for a [facility] cancer genetics consultation at the request of Dr. Sarajane Jews due to a family history of cancer.  Alison Cervantes presents to clinic today to discuss the possibility of a hereditary predisposition to cancer, genetic testing, and to further clarify her future cancer risks, as well as potential cancer risks for family members.  Alison Cervantes is a 35 y.o. female with no personal history of cancer.  She has a strong maternal family history of breast and other cancers.  CANCER HISTORY:   No history exists.     HORMONAL RISK FACTORS:  Menarche was at age 35.  First live birth at age 58.  OCP use for approximately 7 years.  Ovaries intact: yes.  Hysterectomy: no.  Menopausal status: premenopausal.  HRT use: 0 years. Colonoscopy: no; not examined. Mammogram within the last year: no. Number of breast biopsies: 0. Up to date with pelvic exams:  yes. Any excessive radiation exposure in the past:  no  Past Medical History:  Diagnosis Date  . ADHD (attention deficit hyperactivity disorder)   . Anemia   . Asthma   . Depression   . Family history of breast cancer   . Family history of colon cancer   . Family history of melanoma   . Family history of ovarian cancer   . Family history of prostate cancer   . Family history of uterine cancer   . Headache(784.0)   . Peptic ulcer disease     No past surgical history on file.  Social History   Social History  . Marital status: Single    Spouse name: N/A  . Number of  children: N/A  . Years of education: N/A   Social History Main Topics  . Smoking status: Never Smoker  . Smokeless tobacco: Never Used  . Alcohol use No  . Drug use: No  . Sexual activity: Not on file   Other Topics Concern  . Not on file   Social History Narrative  . No narrative on file     FAMILY HISTORY:  We obtained a detailed, 4-generation family history.  Significant diagnoses are listed below: Family History  Problem Relation Age of Onset  . Alcohol abuse Father   . Heart disease Father   . Hyperlipidemia Father   . Hypertension Father   . Hyperlipidemia Mother   . Hypertension Mother   . Melanoma Mother   . Diabetes Maternal Grandmother   . Heart disease Maternal Grandmother   . Hyperlipidemia Maternal Grandmother   . Hypertension Maternal Grandmother   . Breast cancer Maternal Grandmother   . Hyperlipidemia Paternal Grandmother   . Hypertension Paternal Grandmother   . Leukemia Paternal Grandmother   . Brain cancer Paternal Grandmother   . Cancer Paternal Grandfather     prostate, colon, stomach cancer  . Diabetes Paternal Grandfather   . Hyperlipidemia Paternal Grandfather   . Hypertension Paternal Grandfather   . Alcohol abuse  family hx  . Breast cancer      1st degree family <50  . Coronary artery disease      1st degree ><60  . Diabetes      family hx  . Hypertension      family hx  . Lung cancer      family hx  . Breast cancer Maternal Aunt   . Uterine cancer Maternal Aunt   . Ovarian cancer Maternal Aunt   . Uterine cancer Cousin 85    maternal first cousin  . Breast cancer Cousin     maternal cousin with breast cancer in her 53s    The patient has three children who are cancer free.  She has a full sister and two maternal brothers.  One brother died of SIDS, the other siblings are all cancer free. The patient's mother was diagnosed with melanoma.  She had one brother and one sister. The brother died from an aneurysm.  He had one  daughter with breast cancer dx at age 25.  Her sister had three cancers including breast, ovarian and uterine cancer and died in her 48's.  This sister had a daughter who was diagnosed and died at 57 from uterine cancer.  The patient's maternal grandmother died at 17 of cancer, she thinks of breast and lung cancer.  The patient's father is an alcoholic.  He had two sisters and a brother, the brother died in infancy.  The patient's paternal grandfather had colon, prostate and stomach cancer, the grandmother had leukemia and brain cancer.  Patient's maternal ancestors are of Caucasian descent, and paternal ancestors are of Caucasian descent. There is no reported Ashkenazi Jewish ancestry. There is no known consanguinity.  GENETIC COUNSELING ASSESSMENT: Alison Cervantes is a 35 y.o. female with a family history of several cancers which is somewhat suggestive of a hereditary cancer syndrome and predisposition to cancer. We, therefore, discussed and recommended the following at today's visit.   DISCUSSION: We discussed that about 5-10% of breast cancer is due to hereditary causes, most commonly BRCA mutations.  We discussed other genes including ATM, PALB2, and CHEK2.  With the young age of onset of breast and uterine cancer in the patient's cousins, we also discussed TP53.  We reviewed the characteristics, features and inheritance patterns of hereditary cancer syndromes. We also discussed genetic testing, including the appropriate family members to test, the process of testing, insurance coverage and turn-around-time for results. We discussed the implications of a negative, positive and/or variant of uncertain significant result. We recommended Alison Cervantes pursue genetic testing for the Comprehensive Cancer gene panel. The Comprehensive Cancer Panel offered by [facility] includes sequencing and/or deletion duplication testing of the following 32 genes: APC, ATM, AXIN2, BARD1, BMPR1A, BRCA1, BRCA2, BRIP1, CDH1, CDK4,  CDKN2A, CHEK2, EPCAM, FANCC, MLH1, MSH2, MSH6, MUTYH, NBN, PALB2, PMS2, POLD1, POLE, PTEN, RAD51C, RAD51D, SCG5/GREM1, SMAD4, STK11, TP53, VHL, and XRCC2.     Based on Alison Cervantes's family history of cancer, she meets medical criteria for genetic testing. Despite that she meets criteria, she may still have an out of pocket cost. We discussed that if her out of pocket cost for testing is over $100, the laboratory will call and confirm whether she wants to proceed with testing.  If the out of pocket cost of testing is less than $100 she will be billed by the genetic testing laboratory.   In order to estimate her chance of having a BRCA mutation, we used statistical models (Tyrer Cusik)  and laboratory data that take into account her personal medical history, family history and ancestry.  Because each model is different, there can be a lot of variability in the risks they give.  Therefore, these numbers must be considered a rough range and not a precise risk of having a BRCA mutation.  This model estimates that she has approximately a 12% chance of having a mutation. Based on this assessment of her family and personal history, genetic testing is recommended.  Based on the patient's personal and family history, statistical models (Tyrer Cusik)  and literature data were used to estimate her risk of developing breast cancer. These estimate her lifetime risk of developing breast cancer to be approximately 17.7%. This estimation does not take into account any genetic testing results.  The patient's lifetime breast cancer risk is a preliminary estimate based on available information using one of several models endorsed by the [place] (ACS). The ACS recommends consideration of breast MRI screening as an adjunct to mammography for patients at high risk (defined as 20% or greater lifetime risk). A more detailed breast cancer risk assessment can be considered, if clinically indicated.   PLAN: After  considering the risks, benefits, and limitations, Ms. Aultman  provided informed consent to pursue genetic testing and the blood sample was sent to [facility] for analysis of the Comprehensive cancer panel. Results should be available within approximately 2-3 weeks' time, at which point they will be disclosed by telephone to Alison Cervantes, as will any additional recommendations warranted by these results. Alison Cervantes will receive a summary of her genetic counseling visit and a copy of her results once available. This information will also be available in Epic. We encouraged Alison Cervantes to remain in contact with cancer genetics annually so that we can continuously update the family history and inform her of any changes in cancer genetics and testing that may be of benefit for her family. Alison Cervantes questions were answered to her satisfaction today. Our contact information was provided should additional questions or concerns arise.  Lastly, we encouraged Alison Cervantes to remain in contact with cancer genetics annually so that we can continuously update the family history and inform her of any changes in cancer genetics and testing that may be of benefit for this family.   Ms.  Cervantes questions were answered to her satisfaction today. Our contact information was provided should additional questions or concerns arise. Thank you for the referral and allowing [place] to share in the care of your patient.   Karen P. Florene Glen, [place], [facility] Certified Genetic Counselor [email or web address] phone: [phone]  The patient was seen for a total of 45 minutes in face-to-face genetic counseling.  This patient was discussed with Drs. Magrinat, Lindi Adie and/or Burr Medico who agrees with the above.    _______________________________________________________________________ For Office Staff:  Number of people involved in session: 1 Was an Intern/ student involved with case: no

## 2015-12-19 ENCOUNTER — Encounter: Payer: Self-pay | Admitting: Genetic Counselor

## 2015-12-19 ENCOUNTER — Ambulatory Visit: Payer: Self-pay | Admitting: Genetic Counselor

## 2015-12-19 ENCOUNTER — Telehealth: Payer: Self-pay | Admitting: Genetic Counselor

## 2015-12-19 DIAGNOSIS — Z8049 Family history of malignant neoplasm of other genital organs: Secondary | ICD-10-CM

## 2015-12-19 DIAGNOSIS — Z1379 Encounter for other screening for genetic and chromosomal anomalies: Secondary | ICD-10-CM | POA: Insufficient documentation

## 2015-12-19 DIAGNOSIS — Z8041 Family history of malignant neoplasm of ovary: Secondary | ICD-10-CM

## 2015-12-19 DIAGNOSIS — Z803 Family history of malignant neoplasm of breast: Secondary | ICD-10-CM

## 2015-12-19 DIAGNOSIS — Z8042 Family history of malignant neoplasm of prostate: Secondary | ICD-10-CM

## 2015-12-19 DIAGNOSIS — Z800 Family history of malignant neoplasm of digestive organs: Secondary | ICD-10-CM

## 2015-12-19 DIAGNOSIS — Z808 Family history of malignant neoplasm of other organs or systems: Secondary | ICD-10-CM

## 2015-12-19 NOTE — Telephone Encounter (Signed)
Revealed negative genetic testing on the comprehensive cancer panel.  Discussed that we do not know why there is cancer in the family.  Discussed that there could be a hereditary cancer syndrome running in the family that she did not inherit. Or there could be a change in a gene that we did not test.  Explained that if her maternal cousin who had breast cancer in her 40s has had genetic testing it would be helpful to learn if she were positive.  If she was negative, we may want to consider having patient have a baseline mammogram at a younger age.  Patient voiced her understanding.

## 2015-12-19 NOTE — Progress Notes (Signed)
HPI: Ms. Royal was previously seen in the [place] clinic due to a family history of cancer and concerns regarding a hereditary predisposition to cancer. Please refer to our prior cancer genetics clinic note for more information regarding Ms. Ohanian's medical, social and family histories, and our assessment and recommendations, at the time. Ms. Grossi recent genetic test results were disclosed to her, as were recommendations warranted by these results. These results and recommendations are discussed in more detail below.  FAMILY HISTORY:  We obtained a detailed, 4-generation family history.  Significant diagnoses are listed below: Family History  Problem Relation Age of Onset  . Alcohol abuse Father   . Heart disease Father   . Hyperlipidemia Father   . Hypertension Father   . Hyperlipidemia Mother   . Hypertension Mother   . Melanoma Mother   . Diabetes Maternal Grandmother   . Heart disease Maternal Grandmother   . Hyperlipidemia Maternal Grandmother   . Hypertension Maternal Grandmother   . Breast cancer Maternal Grandmother   . Hyperlipidemia Paternal Grandmother   . Hypertension Paternal Grandmother   . Leukemia Paternal Grandmother   . Brain cancer Paternal Grandmother   . Cancer Paternal Grandfather     prostate, colon, stomach cancer  . Diabetes Paternal Grandfather   . Hyperlipidemia Paternal Grandfather   . Hypertension Paternal Grandfather   . Alcohol abuse      family hx  . Breast cancer      1st degree family <50  . Coronary artery disease      1st degree ><60  . Diabetes      family hx  . Hypertension      family hx  . Lung cancer      family hx  . Breast cancer Maternal Aunt   . Uterine cancer Maternal Aunt   . Ovarian cancer Maternal Aunt   . Uterine cancer Cousin 39    maternal first cousin  . Breast cancer Cousin     maternal cousin with breast cancer in her 18s    The patient has three children who are cancer free.  She has a full  sister and two maternal brothers.  One brother died of SIDS, the other siblings are all cancer free. The patient's mother was diagnosed with melanoma.  She had one brother and one sister. The brother died from an aneurysm.  He had one daughter with breast cancer dx at age 67.  Her sister had three cancers including breast, ovarian and uterine cancer and died in her 62's.  This sister had a daughter who was diagnosed and died at 13 from uterine cancer.  The patient's maternal grandmother died at 53 of cancer, she thinks of breast and lung cancer.  The patient's father is an alcoholic.  He had two sisters and a brother, the brother died in infancy.  The patient's paternal grandfather had colon, prostate and stomach cancer, the grandmother had leukemia and brain cancer.  Patient's maternal ancestors are of Caucasian descent, and paternal ancestors are of Caucasian descent. There is no reported Ashkenazi Jewish ancestry. There is no known consanguinity.  GENETIC TEST RESULTS: At the time of Ms. Velez's visit, we recommended she pursue genetic testing of the Comprehensive Cancer gene panel. The Comprehensive Cancer Panel offered by [facility] includes sequencing and/or deletion duplication testing of the following 32 genes: APC, ATM, AXIN2, BARD1, BMPR1A, BRCA1, BRCA2, BRIP1, CDH1, CDK4, CDKN2A, CHEK2, EPCAM, FANCC, MLH1, MSH2, MSH6, MUTYH, NBN, PALB2, PMS2, POLD1, POLE, PTEN,  RAD51C, RAD51D, SCG5/GREM1, SMAD4, STK11, TP53, VHL, and XRCC2.   The report date is December 18, 2015.  Genetic testing was normal, and did not reveal a deleterious mutation in these genes. The test report has been scanned into EPIC and is located under the Molecular Pathology section of the Results Review tab.   We discussed with Ms. Stang that since the current genetic testing is not perfect, it is possible there may be a gene mutation in one of these genes that current testing cannot detect, but that chance is small. We also discussed, that it  is possible that another gene that has not yet been discovered, or that we have not yet tested, is responsible for the cancer diagnoses in the family, and it is, therefore, important to remain in touch with cancer genetics in the future so that we can continue to offer Ms. Mikaelian the most up to date genetic testing.   CANCER SCREENING RECOMMENDATIONS: Given Ms. Sachs's personal and family histories, we must interpret these negative results with some caution.  Families with features suggestive of hereditary risk for cancer tend to have multiple family members with cancer, diagnoses in multiple generations and diagnoses before the age of 40. Ms. Holtry family exhibits some of these features. Thus this result may simply reflect our current inability to detect all mutations within these genes or there may be a different gene that has not yet been discovered or tested.   RECOMMENDATIONS FOR FAMILY MEMBERS: Women in this family might be at some increased risk of developing cancer, over the general population risk, simply due to the family history of cancer. We recommended women in this family have a yearly mammogram beginning at age 9, or 75 years younger than the earliest onset of cancer, an annual clinical breast exam, and perform monthly breast self-exams. Women in this family should also have a gynecological exam as recommended by their primary provider. All family members should have a colonoscopy by age 61.  Based on Ms. Moorehead's family history, we recommended her maternal cousin, who was diagnosed with breast cancer in her 70's, have genetic counseling and testing. Ms. Lenig will let us know if we can be of any assistance in coordinating genetic counseling and/or testing for this family member.   FOLLOW-UP: Lastly, we discussed with Ms. Kizer that cancer genetics is a rapidly advancing field and it is possible that new genetic tests will be appropriate for her and/or her family members in the future. We  encouraged her to remain in contact with cancer genetics on an annual basis so we can update her personal and family histories and let her know of advances in cancer genetics that may benefit this family.   Our contact number was provided. Ms. Linskey questions were answered to her satisfaction, and she knows she is welcome to call us at anytime with additional questions or concerns.   Roma Kayser, MS, [facility] Certified Genetic Counselor [email or web address]

## 2015-12-20 ENCOUNTER — Ambulatory Visit: Payer: Self-pay | Admitting: Genetic Counselor

## 2015-12-20 DIAGNOSIS — Z8049 Family history of malignant neoplasm of other genital organs: Secondary | ICD-10-CM

## 2015-12-20 DIAGNOSIS — Z800 Family history of malignant neoplasm of digestive organs: Secondary | ICD-10-CM

## 2015-12-20 DIAGNOSIS — Z803 Family history of malignant neoplasm of breast: Secondary | ICD-10-CM

## 2015-12-20 DIAGNOSIS — Z8041 Family history of malignant neoplasm of ovary: Secondary | ICD-10-CM

## 2015-12-20 DIAGNOSIS — Z808 Family history of malignant neoplasm of other organs or systems: Secondary | ICD-10-CM

## 2015-12-20 DIAGNOSIS — Z8042 Family history of malignant neoplasm of prostate: Secondary | ICD-10-CM

## 2015-12-20 DIAGNOSIS — Z1379 Encounter for other screening for genetic and chromosomal anomalies: Secondary | ICD-10-CM

## 2015-12-20 NOTE — Progress Notes (Signed)
HPI: Alison Cervantes was previously seen in the [place] [facility] due to a family history of cancer and concerns regarding a hereditary predisposition to cancer. Please refer to our prior cancer genetics clinic note for more information regarding Alison Cervantes's medical, social and family histories, and our assessment and recommendations, at the time. Alison Cervantes's recent genetic test results were disclosed to her, as were recommendations warranted by these results. These results and recommendations are discussed in more detail below.  FAMILY HISTORY:  We obtained a detailed, 4-generation family history.  Significant diagnoses are listed below: Family History  Problem Relation Age of Onset  . Alcohol abuse Father   . Heart disease Father   . Hyperlipidemia Father   . Hypertension Father   . Hyperlipidemia Mother   . Hypertension Mother   . Melanoma Mother   . Diabetes Maternal Grandmother   . Heart disease Maternal Grandmother   . Hyperlipidemia Maternal Grandmother   . Hypertension Maternal Grandmother   . Breast cancer Maternal Grandmother   . Hyperlipidemia Paternal Grandmother   . Hypertension Paternal Grandmother   . Leukemia Paternal Grandmother   . Brain cancer Paternal Grandmother   . Cancer Paternal Grandfather     prostate, colon, stomach cancer  . Diabetes Paternal Grandfather   . Hyperlipidemia Paternal Grandfather   . Hypertension Paternal Grandfather   . Alcohol abuse      family hx  . Breast cancer      1st degree family <50  . Coronary artery disease      1st degree ><60  . Diabetes      family hx  . Hypertension      family hx  . Lung cancer      family hx  . Breast cancer Maternal Aunt   . Uterine cancer Maternal Aunt   . Ovarian cancer Maternal Aunt   . Uterine cancer Cousin 19    maternal first cousin  . Breast cancer Cousin     maternal cousin with breast cancer in her 20s    The patient has three children who are cancer free.  She has a full  sister and two maternal brothers.  One brother died of SIDS, the other siblings are all cancer free. The patient's mother was diagnosed with melanoma.  She had one brother and one sister. The brother died from an aneurysm.  He had one daughter with breast cancer dx at age 20.  Her sister had three cancers including breast, ovarian and uterine cancer and died in her 40's.  This sister had a daughter who was diagnosed and died at 19 from uterine cancer.  The patient's maternal grandmother died at 54 of cancer, she thinks of breast and lung cancer.  The patient's father is an alcoholic.  He had two sisters and a brother, the brother died in infancy.  The patient's paternal grandfather had colon, prostate and stomach cancer, the grandmother had leukemia and brain cancer.  Patient's maternal ancestors are of Caucasian descent, and paternal ancestors are of Caucasian descent. There is no reported Ashkenazi Jewish ancestry. There is no known consanguinity.  GENETIC TEST RESULTS: At the time of Alison Cervantes's visit, we recommended she pursue genetic testing of the Comprehensive cancer gene panel. The Comprehensive Cancer Panel offered by [facility] includes sequencing and/or deletion duplication testing of the following 32 genes: APC, ATM, AXIN2, BARD1, BMPR1A, BRCA1, BRCA2, BRIP1, CDH1, CDK4, CDKN2A, CHEK2, EPCAM, FANCC, MLH1, MSH2, MSH6, MUTYH, NBN, PALB2, PMS2, POLD1, POLE, PTEN,   RAD51C, RAD51D, SCG5/GREM1, SMAD4, STK11, TP53, VHL, and XRCC2.   The report date is December 18, 2015.  Genetic testing was normal, and did not reveal a deleterious mutation in these genes. The test report has been scanned into EPIC and is located under the Molecular Pathology section of the Results Review tab.   We discussed with Alison Cervantes that since the current genetic testing is not perfect, it is possible there may be a gene mutation in one of these genes that current testing cannot detect, but that chance is small. We also discussed, that it  is possible that another gene that has not yet been discovered, or that we have not yet tested, is responsible for the cancer diagnoses in the family, and it is, therefore, important to remain in touch with cancer genetics in the future so that we can continue to offer Alison Cervantes the most up to date genetic testing.   Risk Assessment: Given Alison Cervantes's personal and family histories, we must interpret these negative results with some caution.  Families with features suggestive of hereditary risk for cancer tend to have multiple family members with cancer, diagnoses in multiple generations and diagnoses before the age of 38. Alison Cervantes family exhibits some of these features. Thus this result may simply reflect our current inability to detect all mutations within these genes or there may be a different gene that has not yet been discovered or tested.   RECOMMENDATIONS FOR FAMILY MEMBERS: Women in this family might be at some increased risk of developing cancer, over the general population risk, simply due to the family history of cancer. We recommended women in this family have a yearly mammogram beginning at age 63, or 67 years younger than the earliest onset of cancer, an annual clinical breast exam, and perform monthly breast self-exams. Women in this family should also have a gynecological exam as recommended by their primary provider. All family members should have a colonoscopy by age 16.  Based on Alison Cervantes's family history, we recommended her maternal cousin, who was diagnosed with breast cancer in her 27's, have genetic counseling and testing. Alison Cervantes will let us know if we can be of any assistance in coordinating genetic counseling and/or testing for this family member.   FOLLOW-UP: Lastly, we discussed with Alison Cervantes that cancer genetics is a rapidly advancing field and it is possible that new genetic tests will be appropriate for her and/or her family members in the future. We encouraged her to  remain in contact with cancer genetics on an annual basis so we can update her personal and family histories and let her know of advances in cancer genetics that may benefit this family.   Our contact number was provided. Alison Cervantes questions were answered to her satisfaction, and she knows she is welcome to call us at anytime with additional questions or concerns.   Roma Kayser, MS, [facility] Certified Genetic Counselor [email or web address]

## 2016-01-13 ENCOUNTER — Telehealth: Payer: Self-pay | Admitting: Family Medicine

## 2016-01-13 NOTE — Telephone Encounter (Signed)
° ° °  Pt request refill of the following:  amphetamine-dextroamphetamine (ADDERALL XR) 30 MG 24 hr capsule   amphetamine-dextroamphetamine (ADDERALL) 30 MG tablet      Phamacy:

## 2016-01-15 MED ORDER — AMPHETAMINE-DEXTROAMPHETAMINE 30 MG PO TABS: ORAL_TABLET | ORAL | 0 refills | Status: DC

## 2016-01-15 MED ORDER — AMPHETAMINE-DEXTROAMPHET ER 30 MG PO CP24: ORAL_CAPSULE | ORAL | 0 refills | Status: DC

## 2016-01-15 NOTE — Telephone Encounter (Signed)
done

## 2016-01-15 NOTE — Telephone Encounter (Signed)
Pt calling to check the status of her Rx she states she is out pt is aware that Dr Sarajane Jews has not been in the office.

## 2016-01-16 ENCOUNTER — Other Ambulatory Visit: Payer: Self-pay | Admitting: Family Medicine

## 2016-01-16 NOTE — Telephone Encounter (Signed)
Script is ready for pick up here at front office and I left a voice message with this information.  

## 2016-01-16 NOTE — Telephone Encounter (Signed)
Duplicate, see previous note 

## 2016-02-17 ENCOUNTER — Other Ambulatory Visit: Payer: Self-pay | Admitting: Family Medicine

## 2016-03-25 ENCOUNTER — Emergency Department (HOSPITAL_BASED_OUTPATIENT_CLINIC_OR_DEPARTMENT_OTHER)
Admission: EM | Admit: 2016-03-25 | Discharge: 2016-03-25 | Disposition: A | Discharge status: Home / Self Care | Payer: BLUE CROSS/BLUE SHIELD | Attending: Dermatology | Admitting: Dermatology

## 2016-03-25 ENCOUNTER — Encounter (HOSPITAL_BASED_OUTPATIENT_CLINIC_OR_DEPARTMENT_OTHER): Payer: Self-pay | Admitting: Emergency Medicine

## 2016-03-25 DIAGNOSIS — J45909 Unspecified asthma, uncomplicated: Secondary | ICD-10-CM | POA: Insufficient documentation

## 2016-03-25 DIAGNOSIS — Z5321 Procedure and treatment not carried out due to patient leaving prior to being seen by health care provider: Secondary | ICD-10-CM | POA: Diagnosis not present

## 2016-03-25 DIAGNOSIS — M79605 Pain in left leg: Secondary | ICD-10-CM | POA: Insufficient documentation

## 2016-03-25 DIAGNOSIS — M79604 Pain in right leg: Secondary | ICD-10-CM | POA: Insufficient documentation

## 2016-03-25 DIAGNOSIS — F909 Attention-deficit hyperactivity disorder, unspecified type: Secondary | ICD-10-CM | POA: Diagnosis not present

## 2016-03-25 HISTORY — DX: Sciatica, unspecified side: M54.30

## 2016-03-25 NOTE — ED Notes (Signed)
Pt states that she cannot wait any longer, encouraged to await md eval, states she needs to leave. amb out of ed with steady gait.

## 2016-03-25 NOTE — ED Triage Notes (Signed)
Pain from bil buttocks radiating down both legs.  Pt sts it is "nerve pain".  Hx of back pain and herniated discs.

## 2016-04-13 ENCOUNTER — Encounter (HOSPITAL_COMMUNITY): Payer: Self-pay

## 2016-04-22 ENCOUNTER — Telehealth: Payer: Self-pay | Admitting: Family Medicine

## 2016-04-22 MED ORDER — AMPHETAMINE-DEXTROAMPHET ER 30 MG PO CP24: ORAL_CAPSULE | ORAL | 0 refills | Status: DC

## 2016-04-22 MED ORDER — AMPHETAMINE-DEXTROAMPHETAMINE 30 MG PO TABS: ORAL_TABLET | ORAL | 0 refills | Status: DC

## 2016-04-22 NOTE — Telephone Encounter (Signed)
done

## 2016-04-22 NOTE — Telephone Encounter (Signed)
° ° ° ° °  Pt request refill of the following:  amphetamine-dextroamphetamine (ADDERALL) 30 MG tablet   amphetamine-dextroamphetamine (ADDERALL XR) 30 MG 24 hr capsule      Phamacy:

## 2016-05-05 ENCOUNTER — Emergency Department (HOSPITAL_COMMUNITY)
Admission: EM | Admit: 2016-05-05 | Discharge: 2016-05-05 | Disposition: A | Discharge status: Home / Self Care | Payer: BLUE CROSS/BLUE SHIELD | Attending: Emergency Medicine | Admitting: Emergency Medicine

## 2016-05-05 ENCOUNTER — Other Ambulatory Visit: Payer: Self-pay

## 2016-05-05 ENCOUNTER — Encounter (HOSPITAL_COMMUNITY): Payer: Self-pay

## 2016-05-05 DIAGNOSIS — F909 Attention-deficit hyperactivity disorder, unspecified type: Secondary | ICD-10-CM | POA: Diagnosis not present

## 2016-05-05 DIAGNOSIS — R531 Weakness: Secondary | ICD-10-CM

## 2016-05-05 DIAGNOSIS — R55 Syncope and collapse: Secondary | ICD-10-CM | POA: Insufficient documentation

## 2016-05-05 DIAGNOSIS — J45909 Unspecified asthma, uncomplicated: Secondary | ICD-10-CM | POA: Insufficient documentation

## 2016-05-05 LAB — URINALYSIS, ROUTINE W REFLEX MICROSCOPIC
BACTERIA UA: NONE SEEN
BILIRUBIN URINE: NEGATIVE
Glucose, UA: NEGATIVE mg/dL
HGB URINE DIPSTICK: NEGATIVE
KETONES UR: NEGATIVE mg/dL
NITRITE: NEGATIVE
PROTEIN: NEGATIVE mg/dL
Specific Gravity, Urine: 1.012 (ref 1.005–1.030)
WBC UA: NONE SEEN WBC/hpf (ref 0–5)
pH: 6 (ref 5.0–8.0)

## 2016-05-05 LAB — CBC
HEMATOCRIT: 41.6 % (ref 36.0–46.0)
HEMOGLOBIN: 14.1 g/dL (ref 12.0–15.0)
MCH: 32.7 pg (ref 26.0–34.0)
MCHC: 33.9 g/dL (ref 30.0–36.0)
MCV: 96.5 fL (ref 78.0–100.0)
Platelets: 250 10*3/uL (ref 150–400)
RBC: 4.31 MIL/uL (ref 3.87–5.11)
RDW: 12.7 % (ref 11.5–15.5)
WBC: 10.7 10*3/uL — AB (ref 4.0–10.5)

## 2016-05-05 LAB — BASIC METABOLIC PANEL
ANION GAP: 9 (ref 5–15)
BUN: 10 mg/dL (ref 6–20)
CHLORIDE: 104 mmol/L (ref 101–111)
CO2: 25 mmol/L (ref 22–32)
Calcium: 9.3 mg/dL (ref 8.9–10.3)
Creatinine, Ser: 0.72 mg/dL (ref 0.44–1.00)
GFR calc Af Amer: >60 mL/min (ref >60)
Glucose, Bld: 105 mg/dL — ABNORMAL HIGH (ref 65–99)
POTASSIUM: 3.7 mmol/L (ref 3.5–5.1)
SODIUM: 138 mmol/L (ref 135–145)

## 2016-05-05 LAB — I-STAT BETA HCG BLOOD, ED (MC, WL, AP ONLY)

## 2016-05-05 LAB — D-DIMER, QUANTITATIVE: D-Dimer, Quant: <0.27 ug/mL-FEU (ref 0.00–0.50)

## 2016-05-05 NOTE — ED Notes (Addendum)
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 Pt A&OX4. Pt states the SOB has been worse over the past few weeks.

## 2016-05-05 NOTE — ED Triage Notes (Signed)
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  Pt has had weakness, low hgb for past several months.  Today while working pt had been very weak, dizzy, facial flushing.

## 2016-05-05 NOTE — ED Provider Notes (Signed)
[place] DEPT Provider Note   CSN: [id number] Arrival date & time: 05/05/16 1545     History    Chief Complaint  Patient presents with  . Weakness     HPI Alison Cervantes is a 36 y.o. female.  36yo F w/ PMH Including anemia, asthma, ADHD, and depression who presents with near syncope and weakness. 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 She stated that she felt off balance when walking. She reports several weeks of generalized weakness and fatigue. She sat down because she did not feel well and was having dizziness with standing. When she stood back up, she became lightheaded and had a near syncopal event. She states she does not think she completely lost consciousness because everything went black but she remembers hearing voices around her. She did not fall or strike her head. She reports intermittent exertional shortness of breath and hot flashes for the past one month. She states she occasionally has chest pain but not often. She denies any fevers, vomiting, or diarrhea. She does state that she has been following with hematology oncology for several vague symptoms that have required extensive workup.   Past Medical History:  Diagnosis Date  . ADHD (attention deficit hyperactivity disorder)   . Anemia   . Asthma   . Depression   . Family history of breast cancer   . Family history of colon cancer   . Family history of melanoma   . Family history of ovarian cancer   . Family history of prostate cancer   . Family history of uterine cancer   . Headache(784.0)   . Peptic ulcer disease   . Sciatica      Patient Active Problem List   Diagnosis Date Noted  . Genetic testing 12/19/2015  . Family history of breast cancer   . Family history of ovarian cancer   . Family history of uterine cancer   . Family history of colon cancer   . Family history of prostate cancer   . Family history of melanoma   . Chronic fatigue  06/29/2014  . ADHD (attention deficit hyperactivity disorder) 03/10/2013  . Alcohol abuse 03/10/2013  . MIGRAINE HEADACHE 04/16/2009  . PALPITATIONS 11/21/2008  . CHEST WALL PAIN, ACUTE 04/25/2008  . PLANTAR WART 10/28/2007  . SKIN LESION 03/04/2007  . INSOMNIA, CHRONIC 12/24/2006  . OLIGOMENORRHEA 12/24/2006  . ANEMIA-NOS 10/26/2006  . Depression 10/26/2006  . ASTHMA 10/26/2006  . ORAL ULCER 10/26/2006  . PEPTIC ULCER DISEASE 10/26/2006  . HEADACHE 10/26/2006    History reviewed. No pertinent surgical history.  OB History    No data available        Home Medications    Prior to Admission medications   Medication Sig Start Date End Date Taking? Authorizing Provider  ALPRAZolam Duanne Moron) 0.5 MG tablet TAKE 1 TABLET BY MOUTH TWICE DAILY 10/11/15   Laurey Morale, MD  amphetamine-dextroamphetamine (ADDERALL XR) 30 MG 24 hr capsule Once a day in the morning 04/22/16   Laurey Morale, MD  amphetamine-dextroamphetamine (ADDERALL XR) 30 MG 24 hr capsule Once a day in the morning 04/22/16   Laurey Morale, MD  amphetamine-dextroamphetamine (ADDERALL) 30 MG tablet TAKE 1 TABLET BY MOUTH ONCE DAILY IN THE EVENING. (FILL 08/18/15) 04/22/16   Laurey Morale, MD  buPROPion (WELLBUTRIN XL) 150 MG 24 hr tablet Take 1 tablet (150 mg total) by mouth daily. 06/18/15   Ishmael Holter  Sarajane Jews, MD  lamoTRIgine (LAMICTAL) 25 MG tablet TAKE 1 TABLET BY MOUTH AT BEDTIME. 02/18/16   Eulas Post, MD  levonorgestrel (MIRENA) 20 MCG/24HR IUD 1 each by Intrauterine route once.    Historical Provider, MD  Multiple Vitamins-Minerals (WOMENS ONE DAILY PO) Take 1 tablet by mouth daily. Reported on 02/19/2015    Historical Provider, MD      Family History  Problem Relation Age of Onset  . Alcohol abuse Father   . Heart disease Father   . Hyperlipidemia Father   . Hypertension Father   . Hyperlipidemia Mother   . Hypertension Mother   . Melanoma Mother   . Diabetes Maternal Grandmother   . Heart disease Maternal  Grandmother   . Hyperlipidemia Maternal Grandmother   . Hypertension Maternal Grandmother   . Breast cancer Maternal Grandmother   . Hyperlipidemia Paternal Grandmother   . Hypertension Paternal Grandmother   . Leukemia Paternal Grandmother   . Brain cancer Paternal Grandmother   . Cancer Paternal Grandfather     prostate, colon, stomach cancer  . Diabetes Paternal Grandfather   . Hyperlipidemia Paternal Grandfather   . Hypertension Paternal Grandfather   . Alcohol abuse      family hx  . Breast cancer      1st degree family <50  . Coronary artery disease      1st degree ><60  . Diabetes      family hx  . Hypertension      family hx  . Lung cancer      family hx  . Breast cancer Maternal Aunt   . Uterine cancer Maternal Aunt   . Ovarian cancer Maternal Aunt   . Uterine cancer Cousin 63    maternal first cousin  . Breast cancer Cousin     maternal cousin with breast cancer in her 28s     Social History  Substance Use Topics  . Smoking status: Never Smoker  . Smokeless tobacco: Never Used  . Alcohol use No     Allergies     Patient has no known allergies.    Review of Systems  10 Systems reviewed and are negative for acute change except as noted in the HPI.   Physical Exam Updated Vital Signs BP 125/92   Pulse 117   Temp 98.6 F (37 C) (Oral)   Resp 20   Ht 5\' 2"  (1.575 m)   Wt 159 lb (72.1 kg)   SpO2 99%   BMI 29.08 kg/m   Physical Exam  Constitutional: She is oriented to person, place, and time. She appears well-developed and well-nourished. No distress.  HENT:  Head: Normocephalic and atraumatic.  Moist mucous membranes  Eyes: Conjunctivae are normal. Pupils are equal, round, and reactive to light.  Neck: Neck supple.  Cardiovascular: Regular rhythm and normal heart sounds.  Tachycardia present.   No murmur heard. Pulmonary/Chest: Effort normal and breath sounds normal.  Abdominal: Soft. Bowel sounds are normal. She exhibits no  distension. There is no tenderness.  Musculoskeletal: She exhibits no edema.  Neurological: She is alert and oriented to person, place, and time.  Fluent speech  Skin: Skin is warm and dry.  Psychiatric: Judgment normal.  Anxious  Nursing note and vitals reviewed.     ED Treatments / Results  Labs (all labs ordered are listed, but only abnormal results are displayed) Labs Reviewed  BASIC METABOLIC PANEL - Abnormal; Notable for the following:       Result Value  Glucose, Bld 105 (*)    All other components within normal limits  CBC - Abnormal; Notable for the following:    WBC 10.7 (*)    All other components within normal limits  URINALYSIS, ROUTINE W REFLEX MICROSCOPIC - Abnormal; Notable for the following:    Leukocytes, UA SMALL (*)    Squamous Epithelial / LPF 0-5 (*)    All other components within normal limits  D-DIMER, QUANTITATIVE (NOT AT [facility])  CBG MONITORING, ED  I-STAT BETA HCG BLOOD, ED ([facility], [facility], [facility] ONLY)     EKG  EKG Interpretation  Date/Time:  Tuesday May 05 2016 16:01:30 EST Ventricular Rate:  104 PR Interval:  138 QRS Duration: 74 QT Interval:  328 QTC Calculation: 431 R Axis:   18 Text Interpretation:  Sinus tachycardia Cannot rule out Anterior infarct , age undetermined Abnormal ECG No previous ECGs available Confirmed by LITTLE MD, RACHEL [id number]) on 05/05/2016 6:10:36 PM         Radiology No results found.  Procedures Procedures (including critical care time) Procedures  Medications Ordered in ED  Medications - No data to display   Initial Impression / Assessment and Plan / ED Course  I have reviewed the triage vital signs and the nursing notes.  Pertinent labs that were available during my care of the patient were reviewed by me and considered in my medical decision making (see chart for details).     Patient presents with near syncopal episode that occurred while at work today associated with several weeks of generalized  weakness and fatigue. No infectious symptoms. She was anxious but in no acute distress on exam. Vital signs notable for tachycardia, normal O2 saturation on room air. Clear breath sounds. EKG without ischemic changes. Obtained above lab work including d-dimer given the patient's report of exertional shortness of breath over the past several months. All of her lab work was unremarkable. She remained well-appearing on reexamination. Given her young age and overall health, I feel cardiac etiology is extremely unlikely especially given her reassuring EKG and no risk factors for ACS. I have recommended that she follow up with her primary care provider to discuss whether she needs a Holter monitor for further evaluation. Reviewed return precautions with her. She voiced understanding and was discharged in satisfactory condition.  Final Clinical Impressions(s) / ED Diagnoses   Final diagnoses:  Generalized weakness  Near syncope     New Prescriptions   No medications on file       Sharlett Iles, MD 05/05/16 2130

## 2016-05-05 NOTE — ED Notes (Signed)
ED Provider at bedside. 

## 2016-07-21 ENCOUNTER — Other Ambulatory Visit: Payer: Self-pay | Admitting: Family Medicine

## 2016-08-04 ENCOUNTER — Other Ambulatory Visit: Payer: Self-pay | Admitting: Family Medicine

## 2016-08-04 ENCOUNTER — Telehealth: Payer: Self-pay | Admitting: Family Medicine

## 2016-08-04 NOTE — Telephone Encounter (Signed)
Pt request refill  amphetamine-dextroamphetamine (ADDERALL XR) 30 MG 24 hr capsule  amphetamine-dextroamphetamine (ADDERALL) 30 MG tablet  Pt would like to pick up today because she is out.

## 2016-08-05 ENCOUNTER — Other Ambulatory Visit: Payer: Self-pay | Admitting: Family Medicine

## 2016-08-05 MED ORDER — AMPHETAMINE-DEXTROAMPHET ER 30 MG PO CP24: ORAL_CAPSULE | ORAL | 0 refills | Status: DC

## 2016-08-05 MED ORDER — AMPHETAMINE-DEXTROAMPHETAMINE 30 MG PO TABS: ORAL_TABLET | ORAL | 0 refills | Status: DC

## 2016-08-05 MED ORDER — ALPRAZOLAM 0.5 MG PO TABS
0.5000 mg | ORAL_TABLET | Freq: Two times a day (BID) | ORAL | 0 refills | Status: DC
Start: 2016-08-05 — End: 2016-09-09

## 2016-08-05 NOTE — Telephone Encounter (Signed)
Call in Xanax #[phone] with no refills. She needs an OV soon

## 2016-08-05 NOTE — Telephone Encounter (Signed)
Duplicate note, see previous note.

## 2016-08-05 NOTE — Telephone Encounter (Signed)
Script is ready for pick up here at front office and I left a voice message for pt, also advised to schedule a office visit.

## 2016-08-05 NOTE — Telephone Encounter (Signed)
I wrote for a one month supply of each only. She needs an OV soon

## 2016-08-17 ENCOUNTER — Ambulatory Visit: Payer: BLUE CROSS/BLUE SHIELD | Admitting: Family Medicine

## 2016-09-03 ENCOUNTER — Other Ambulatory Visit: Payer: Self-pay | Admitting: Family Medicine

## 2016-09-04 ENCOUNTER — Ambulatory Visit: Payer: BLUE CROSS/BLUE SHIELD | Admitting: Family Medicine

## 2016-09-04 NOTE — Telephone Encounter (Signed)
Done. She needs an OV soon

## 2016-09-04 NOTE — Telephone Encounter (Signed)
Done. She needs an OV soon.

## 2016-09-09 ENCOUNTER — Ambulatory Visit (INDEPENDENT_AMBULATORY_CARE_PROVIDER_SITE_OTHER): Payer: BLUE CROSS/BLUE SHIELD | Admitting: Family Medicine

## 2016-09-09 ENCOUNTER — Encounter: Payer: Self-pay | Admitting: Family Medicine

## 2016-09-09 VITALS — BP 112/78 | HR 85 | Temp 98.6°F | Ht 62.0 in | Wt 167.0 lb

## 2016-09-09 DIAGNOSIS — F900 Attention-deficit hyperactivity disorder, predominantly inattentive type: Secondary | ICD-10-CM

## 2016-09-09 DIAGNOSIS — F325 Major depressive disorder, single episode, in full remission: Secondary | ICD-10-CM | POA: Diagnosis not present

## 2016-09-09 MED ORDER — ALPRAZOLAM 0.5 MG PO TABS: 0.5000 mg | ORAL_TABLET | Freq: Two times a day (BID) | ORAL | 5 refills | Status: DC | PRN

## 2016-09-09 MED ORDER — AMPHETAMINE-DEXTROAMPHET ER 30 MG PO CP24: ORAL_CAPSULE | ORAL | 0 refills | Status: DC

## 2016-09-09 MED ORDER — AMPHETAMINE-DEXTROAMPHETAMINE 30 MG PO TABS: 30.0000 mg | ORAL_TABLET | Freq: Every evening | ORAL | 0 refills | Status: DC

## 2016-09-09 NOTE — Patient Instructions (Signed)
WE NOW OFFER   [facility] FAST TRACK!!!  SAME DAY Appointments for ACUTE CARE  Such as: Sprains, Injuries, cuts, abrasions, rashes, muscle pain, joint pain, back pain Colds, flu, sore throats, headache, allergies, cough, fever  Ear pain, sinus and eye infections Abdominal pain, nausea, vomiting, diarrhea, upset stomach Animal/insect bites  3 Easy Ways to Schedule: Walk-In Scheduling Call in scheduling Mychart Sign-up: [email or web address]         

## 2016-09-09 NOTE — Progress Notes (Signed)
   Subjective:    Patient ID: Alison Cervantes, female    DOB: 07-Jan-1981, 36 y.o.   MRN: [id number]  HPI Here to follow up on issues. She is doing well in general. Her ADHD is stable. Her depression and anxiety are improved, and she was able to stop taking Wellbutrin and Lamictal.    Review of Systems  Constitutional: Negative.   Respiratory: Negative.   Cardiovascular: Negative.   Neurological: Negative.   Psychiatric/Behavioral: Negative.        Objective:   Physical Exam  Constitutional: She is oriented to person, place, and time. She appears well-developed and well-nourished.  Cardiovascular: Normal rate, regular rhythm, normal heart sounds and intact distal pulses.   Pulmonary/Chest: Effort normal and breath sounds normal.  Neurological: She is alert and oriented to person, place, and time.  Psychiatric: She has a normal mood and affect. Her behavior is normal. Thought content normal.          Assessment & Plan:  Her ADHD is stable and we refilled ehr Adderall. Her moods are also stable. Alison Penna, MD

## 2016-11-19 ENCOUNTER — Encounter: Payer: Self-pay | Admitting: Family Medicine

## 2017-02-19 ENCOUNTER — Other Ambulatory Visit: Payer: Self-pay | Admitting: Family Medicine

## 2017-02-19 NOTE — Telephone Encounter (Signed)
Called pt and left a VM asking her what pharmac she would like to use since we can now send in RX's electronically.

## 2017-02-19 NOTE — Telephone Encounter (Signed)
Copied from [place] [phone]. Topic: Quick Communication - Rx Refill/Question >> Feb 19, 2017  1:19 PM Arletha Grippe wrote: Has the patient contacted their pharmacy? No.   (Agent: If no, request that the patient contact the pharmacy for the refill.)   Preferred Pharmacy (with phone number or street name): pt needs new rx for adderrall. Please call for pickup cb# [phone]  She will run out of meds on 02/23/17.    Agent: Please be advised that RX refills may take up to 3 business days. We ask that you follow-up with your pharmacy.

## 2017-02-19 NOTE — Telephone Encounter (Signed)
Sent back to provider

## 2017-02-22 MED ORDER — AMPHETAMINE-DEXTROAMPHETAMINE 30 MG PO TABS: 30.0000 mg | ORAL_TABLET | Freq: Every evening | ORAL | 0 refills | Status: DC

## 2017-02-22 MED ORDER — AMPHETAMINE-DEXTROAMPHET ER 30 MG PO CP24: ORAL_CAPSULE | ORAL | 0 refills | Status: DC

## 2017-02-22 MED ORDER — AMPHETAMINE-DEXTROAMPHETAMINE 30 MG PO TABS
30.0000 mg | ORAL_TABLET | Freq: Every evening | ORAL | 0 refills | Status: DC
Start: 2017-02-22 — End: 2017-06-15

## 2017-02-22 NOTE — Telephone Encounter (Unsigned)
Copied from [place] [phone]. Topic: Quick Communication - Rx Refill/Question >> Feb 19, 2017  5:14 PM Oliver Pila B wrote: Pt called to inform to [facility] [address], contact pt or pharmacy if needed

## 2017-02-22 NOTE — Telephone Encounter (Signed)
Sent to PCP please send Rx [facility] [address].

## 2017-02-22 NOTE — Telephone Encounter (Signed)
These were emailed in

## 2017-03-26 ENCOUNTER — Telehealth: Payer: Self-pay | Admitting: Family Medicine

## 2017-03-26 NOTE — Telephone Encounter (Signed)
Copied from [place] [phone]. Topic: Quick Communication - Rx Refill/Question >> Mar 26, 2017  3:28 PM Oliver Pila B wrote: Medication: amphetamine-dextroamphetamine (ADDERALL) 30 MG tablet [[id number]] , amphetamine-dextroamphetamine (ADDERALL XR) 30 MG 24 hr capsule [[id number]]    (Agent: If no, request that the patient contact the pharmacy for the refill.)   [facility] phone [facility]   Agent: Please be advised that RX refills may take up to 3 business days. We ask that you follow-up with your pharmacy.

## 2017-03-26 NOTE — Telephone Encounter (Signed)
Pt should have refills.  

## 2017-03-29 ENCOUNTER — Telehealth: Payer: Self-pay | Admitting: Family Medicine

## 2017-03-29 NOTE — Telephone Encounter (Signed)
Last OV 09/09/2016  Last refilled 09/09/2016 disp 60 with 5 refills.  Sent to PCP for approval.

## 2017-03-29 NOTE — Telephone Encounter (Signed)
Refill request for Xanax and send to [facility] on [address].

## 2017-03-30 MED ORDER — ALPRAZOLAM 0.5 MG PO TABS: 0.5000 mg | ORAL_TABLET | Freq: Two times a day (BID) | ORAL | 5 refills | Status: DC | PRN

## 2017-03-30 NOTE — Telephone Encounter (Signed)
Call in #[phone] with 5 rf 

## 2017-03-30 NOTE — Telephone Encounter (Signed)
Rx has been sent  

## 2017-03-30 NOTE — Telephone Encounter (Signed)
Called pharmacy pt pick up her adderall Rx's this morning.

## 2017-06-15 ENCOUNTER — Other Ambulatory Visit: Payer: Self-pay | Admitting: Family Medicine

## 2017-06-15 MED ORDER — AMPHETAMINE-DEXTROAMPHET ER 30 MG PO CP24: ORAL_CAPSULE | ORAL | 0 refills | Status: DC

## 2017-06-15 MED ORDER — AMPHETAMINE-DEXTROAMPHETAMINE 30 MG PO TABS: 30.0000 mg | ORAL_TABLET | Freq: Every evening | ORAL | 0 refills | Status: DC

## 2017-06-15 NOTE — Telephone Encounter (Signed)
Last OV 09/09/2016   Both were last refilled 02/22/2017   Adderall XR 30 MG disp 30 with no refills  Adderall 30 MG disp 30 with no refills   Sent to PCP for approval

## 2017-06-15 NOTE — Telephone Encounter (Signed)
Done

## 2017-07-14 ENCOUNTER — Other Ambulatory Visit: Payer: Self-pay | Admitting: Family Medicine

## 2017-07-14 MED ORDER — AMPHETAMINE-DEXTROAMPHET ER 30 MG PO CP24: 30.0000 mg | ORAL_CAPSULE | Freq: Every day | ORAL | 0 refills | Status: DC

## 2017-07-14 MED ORDER — AMPHETAMINE-DEXTROAMPHETAMINE 30 MG PO TABS: 30.0000 mg | ORAL_TABLET | Freq: Every evening | ORAL | 0 refills | Status: DC

## 2017-07-14 NOTE — Telephone Encounter (Signed)
Last OV 09/09/16, No Future OV  Both last filled 06/15/17, # 30 with 0 refill  Note on Adderall: May fill on 08-15-17

## 2017-07-14 NOTE — Telephone Encounter (Signed)
Done

## 2018-01-14 ENCOUNTER — Other Ambulatory Visit: Payer: Self-pay | Admitting: Family Medicine

## 2018-01-17 ENCOUNTER — Ambulatory Visit: Payer: Self-pay | Admitting: Family Medicine

## 2018-01-17 DIAGNOSIS — Z0289 Encounter for other administrative examinations: Secondary | ICD-10-CM

## 2018-01-22 MED ORDER — AMPHETAMINE-DEXTROAMPHET ER 30 MG PO CP24: 30.0000 mg | ORAL_CAPSULE | Freq: Every day | ORAL | 0 refills | Status: DC

## 2018-01-22 MED ORDER — AMPHETAMINE-DEXTROAMPHETAMINE 30 MG PO TABS: 30.0000 mg | ORAL_TABLET | Freq: Every evening | ORAL | 0 refills | Status: DC

## 2018-01-22 NOTE — Telephone Encounter (Signed)
Done

## 2018-03-31 DIAGNOSIS — Z124 Encounter for screening for malignant neoplasm of cervix: Secondary | ICD-10-CM | POA: Diagnosis not present

## 2018-03-31 DIAGNOSIS — Z01419 Encounter for gynecological examination (general) (routine) without abnormal findings: Secondary | ICD-10-CM | POA: Diagnosis not present

## 2018-04-08 ENCOUNTER — Other Ambulatory Visit: Payer: Self-pay | Admitting: Family Medicine

## 2018-04-13 NOTE — Telephone Encounter (Signed)
Last rx given on 03/30/2017 #60 with 5 ref

## 2018-04-14 NOTE — Telephone Encounter (Signed)
Refill has been called to the pharmacy and left on the VM.  

## 2018-04-14 NOTE — Telephone Encounter (Signed)
Call in #[phone] with 5 rf 

## 2018-06-01 ENCOUNTER — Encounter: Payer: Self-pay | Admitting: Family Medicine

## 2018-06-01 ENCOUNTER — Other Ambulatory Visit: Payer: Self-pay | Admitting: Family Medicine

## 2018-06-07 ENCOUNTER — Ambulatory Visit (INDEPENDENT_AMBULATORY_CARE_PROVIDER_SITE_OTHER): Payer: 59 | Admitting: Family Medicine

## 2018-06-07 ENCOUNTER — Other Ambulatory Visit: Payer: Self-pay

## 2018-06-07 ENCOUNTER — Encounter: Payer: Self-pay | Admitting: Family Medicine

## 2018-06-07 ENCOUNTER — Telehealth: Payer: Self-pay | Admitting: Family Medicine

## 2018-06-07 DIAGNOSIS — G4700 Insomnia, unspecified: Secondary | ICD-10-CM

## 2018-06-07 DIAGNOSIS — F900 Attention-deficit hyperactivity disorder, predominantly inattentive type: Secondary | ICD-10-CM

## 2018-06-07 MED ORDER — AMPHETAMINE-DEXTROAMPHETAMINE 30 MG PO TABS: 30.0000 mg | ORAL_TABLET | Freq: Two times a day (BID) | ORAL | 0 refills | Status: DC

## 2018-06-07 NOTE — Telephone Encounter (Signed)
Dr. Fry please advise. Thanks  

## 2018-06-07 NOTE — Telephone Encounter (Signed)
Copied from [place] [phone]. Topic: Quick Communication - See Telephone Encounter >> Jun 07, 2018  3:35 PM Rutherford Nail, [place] wrote: CRM for notification. See Telephone encounter for: 06/07/18. Patient calling and states that she just had a virtual visit with Dr Sarajane Jews and was going ot go pick up her medication. States that the prescription for  amphetamine-dextroamphetamine (ADDERALL) 30 MG tablet is for next month. Patient states that she has been out of the medication and does not have any for this month. Would like to know if a prescription for this month could be called into the pharmacy today? [id number] [place], [place].

## 2018-06-07 NOTE — Progress Notes (Signed)
   Subjective:    Patient ID: Alison Cervantes, female    DOB: Nov 19, 1980, 38 y.o.   MRN: [id number]  HPI Virtual Visit via Telephone Note  I connected with the patient on 06/07/18 at  8:30 AM EDT by [email or web address] and verified that I am speaking with the correct person using two identifiers. We had technical difficulties with both audio and video, so we completes our visit over the telephone.    I discussed the limitations, risks, security and privacy concerns of performing an evaluation and management service by telephone and the availability of in person appointments. I also discussed with the patient that there may be a patient responsible charge related to this service. The patient expressed understanding and agreed to proceed.  Location patient: home Location provider: work or home office Participants present for the call: patient, provider Patient did not have a visit in the prior 7 days to address this/these issue(s).   History of Present Illness: She is doing very well. 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 She use Adderall for school and work and it helps her quite a bit. Her insurance will not cover the extended release form however, so she asks to go to IR to take this BID. Her BP has averaged around 110/72. She has eliminated gluten and most carbs from her diet and she has lost a lot of weight. Currently she weighs 122 lbs.    Observations/Objective: Patient sounds cheerful and well on the phone. I do not appreciate any SOB. Speech and thought processing are grossly intact. Patient reported vitals:  Assessment and Plan: Her ADHD is stable. We will change the Adderall to IR 30 mg BID. She still uses Xanax at night for sleep.  Alison Penna, MD   Follow Up Instructions:     [phone] 11-20 [phone] 21-30 I did not refer this patient for an OV in the next 24 hours for this/these issue(s).  I  discussed the assessment and treatment plan with the patient. The patient was provided an opportunity to ask questions and all were answered. The patient agreed with the plan and demonstrated an understanding of the instructions.   The patient was advised to call back or seek an in-person evaluation if the symptoms worsen or if the condition fails to improve as anticipated.  I provided 13 minutes of non-face-to-face time during this encounter.   Alison Penna, MD    Review of Systems     Objective:   Physical Exam        Assessment & Plan:

## 2018-06-07 NOTE — Telephone Encounter (Signed)
I already sent in for April, May, and June but I will re-send one to fill today

## 2018-06-08 NOTE — Telephone Encounter (Signed)
Dr. Fry please advise. Thanks  

## 2018-06-08 NOTE — Telephone Encounter (Signed)
I did this yesterday. 

## 2018-10-22 ENCOUNTER — Other Ambulatory Visit: Payer: Self-pay | Admitting: Family Medicine

## 2018-10-24 NOTE — Telephone Encounter (Signed)
Last filled 04/14/2018 Last OV 06/07/2018  Ok to fill?

## 2018-10-26 ENCOUNTER — Other Ambulatory Visit: Payer: Self-pay | Admitting: Family Medicine

## 2018-10-26 NOTE — Telephone Encounter (Signed)
Last filled 10/24/2018 Last OV 06/07/2018

## 2018-11-04 ENCOUNTER — Other Ambulatory Visit: Payer: Self-pay | Admitting: Family Medicine

## 2018-11-04 NOTE — Telephone Encounter (Signed)
Last filled 06/07/2018 Last OV 06/07/2018  Ok to fill?

## 2018-11-08 MED ORDER — AMPHETAMINE-DEXTROAMPHETAMINE 30 MG PO TABS: 30.0000 mg | ORAL_TABLET | Freq: Two times a day (BID) | ORAL | 0 refills | Status: DC

## 2018-11-08 NOTE — Telephone Encounter (Signed)
done

## 2019-03-10 ENCOUNTER — Ambulatory Visit: Payer: 59 | Attending: Internal Medicine

## 2019-03-10 ENCOUNTER — Other Ambulatory Visit: Payer: Self-pay | Admitting: Family Medicine

## 2019-03-10 DIAGNOSIS — Z20822 Contact with and (suspected) exposure to covid-19: Secondary | ICD-10-CM

## 2019-03-10 NOTE — Telephone Encounter (Signed)
Last filled 01/08/2019 Last OV 06/07/2018  Ok to fill?

## 2019-03-11 ENCOUNTER — Other Ambulatory Visit: Payer: Self-pay | Admitting: Family Medicine

## 2019-03-11 MED ORDER — CYCLOBENZAPRINE HCL 10 MG PO TABS: 5.0000 mg | ORAL_TABLET | Freq: Every evening | ORAL | 0 refills | Status: DC | PRN

## 2019-03-11 NOTE — Progress Notes (Signed)
Pt called with radicular lumbar sx, like sciatica.  Missed telehealth appt.  Request flexeril. I don't think that is unreasonable.    Meds ordered this encounter  Medications  . cyclobenzaprine (FLEXERIL) 10 MG tablet    Sig: Take 0.5-1 tablets (5-10 mg total) by mouth at bedtime as needed for muscle spasms.    Dispense:  20 tablet    Refill:  0

## 2019-03-12 LAB — NOVEL CORONAVIRUS, NAA: SARS-CoV-2, NAA: DETECTED — AB

## 2019-03-13 MED ORDER — AMPHETAMINE-DEXTROAMPHETAMINE 30 MG PO TABS: 30.0000 mg | ORAL_TABLET | Freq: Two times a day (BID) | ORAL | 0 refills | Status: DC

## 2019-03-13 NOTE — Telephone Encounter (Signed)
Done

## 2019-03-17 ENCOUNTER — Other Ambulatory Visit: Payer: 59

## 2019-03-21 ENCOUNTER — Other Ambulatory Visit: Payer: 59

## 2019-11-14 ENCOUNTER — Other Ambulatory Visit: Payer: Self-pay

## 2019-11-15 ENCOUNTER — Encounter: Payer: Self-pay | Admitting: Family Medicine

## 2019-11-15 ENCOUNTER — Ambulatory Visit (INDEPENDENT_AMBULATORY_CARE_PROVIDER_SITE_OTHER): Payer: 59 | Admitting: Family Medicine

## 2019-11-15 VITALS — BP 110/88 | HR 78 | Temp 98.4°F | Ht 63.0 in | Wt 146.4 lb

## 2019-11-15 DIAGNOSIS — U071 COVID-19: Secondary | ICD-10-CM | POA: Diagnosis not present

## 2019-11-15 DIAGNOSIS — R358 Other polyuria: Secondary | ICD-10-CM

## 2019-11-15 DIAGNOSIS — Z0000 Encounter for general adult medical examination without abnormal findings: Secondary | ICD-10-CM

## 2019-11-15 DIAGNOSIS — R3589 Other polyuria: Secondary | ICD-10-CM

## 2019-11-15 LAB — POCT URINALYSIS DIPSTICK
Bilirubin, UA: NEGATIVE
Blood, UA: NEGATIVE
Glucose, UA: NEGATIVE
Ketones, UA: NEGATIVE
Nitrite, UA: NEGATIVE
Protein, UA: NEGATIVE
Spec Grav, UA: <=1.005 — AB (ref 1.010–1.025)
Urobilinogen, UA: 0.2 E.U./dL
pH, UA: 6 (ref 5.0–8.0)

## 2019-11-15 MED ORDER — AMPHETAMINE-DEXTROAMPHETAMINE 20 MG PO TABS: 20.0000 mg | ORAL_TABLET | Freq: Two times a day (BID) | ORAL | 0 refills | Status: DC

## 2019-11-15 MED ORDER — ZOLPIDEM TARTRATE 10 MG PO TABS: 10.0000 mg | ORAL_TABLET | Freq: Every day | ORAL | 0 refills | Status: DC

## 2019-11-15 MED ORDER — ESCITALOPRAM OXALATE 10 MG PO TABS: 10.0000 mg | ORAL_TABLET | Freq: Every day | ORAL | 2 refills | Status: DC

## 2019-11-15 NOTE — Progress Notes (Signed)
Subjective:    Patient ID: Alison Cervantes, female    DOB: 01/20/1981, 39 y.o.   MRN: [id number]  HPI Here for a well exam. She feels well physicall y but she wants to discuss depression and ADHD. She wanted to see how she feels without any medication in her body so she weaned herself off all medication over the past few weeks. Now she has trouble focusing again and she has trouble completing tasks at work. She has also been feeling depressed again with sadness and decreased energy. She has trouble sleeping. She does not want to take Xanax anymore because it makes her sleepy.    Review of Systems  Constitutional: Negative.  Negative for activity change, appetite change, diaphoresis, fatigue, fever and unexpected weight change.  HENT: Negative.  Negative for congestion, ear pain, hearing loss, nosebleeds, sore throat, tinnitus, trouble swallowing and voice change.   Eyes: Negative.  Negative for photophobia, pain, discharge, redness and visual disturbance.  Respiratory: Negative.  Negative for apnea, cough, choking, chest tightness, shortness of breath, wheezing and stridor.   Cardiovascular: Negative.  Negative for chest pain, palpitations and leg swelling.  Gastrointestinal: Negative.  Negative for abdominal distention, abdominal pain, blood in stool, constipation, diarrhea, nausea, rectal pain and vomiting.  Genitourinary: Negative.  Negative for difficulty urinating, dysuria, enuresis, flank pain, frequency, hematuria, menstrual problem, urgency, vaginal bleeding, vaginal discharge and vaginal pain.  Musculoskeletal: Negative.  Negative for arthralgias, back pain, gait problem, joint swelling, myalgias, neck pain and neck stiffness.  Skin: Negative.  Negative for color change, pallor, rash and wound.  Neurological: Negative.  Negative for dizziness, tremors, seizures, syncope, speech difficulty, weakness, light-headedness, numbness and headaches.  Hematological: Negative for adenopathy. Does  not bruise/bleed easily.  Psychiatric/Behavioral: Positive for decreased concentration, dysphoric mood and sleep disturbance. Negative for agitation, behavioral problems, confusion, hallucinations, self-injury and suicidal ideas. The patient is not nervous/anxious.        Objective:   Physical Exam Constitutional:      General: She is not in acute distress.    Appearance: Normal appearance. She is well-developed.  HENT:     Head: Normocephalic and atraumatic.     Right Ear: External ear normal.     Left Ear: External ear normal.     Nose: Nose normal.     Mouth/Throat:     Pharynx: No oropharyngeal exudate.  Eyes:     General: No scleral icterus.    Conjunctiva/sclera: Conjunctivae normal.     Pupils: Pupils are equal, round, and reactive to light.  Neck:     Thyroid: No thyromegaly.     Vascular: No JVD.  Cardiovascular:     Rate and Rhythm: Normal rate and regular rhythm.     Heart sounds: Normal heart sounds. No murmur heard.  No friction rub. No gallop.   Pulmonary:     Effort: Pulmonary effort is normal. No respiratory distress.     Breath sounds: Normal breath sounds. No wheezing or rales.  Chest:     Chest wall: No tenderness.  Abdominal:     General: Bowel sounds are normal. There is no distension.     Palpations: Abdomen is soft. There is no mass.     Tenderness: There is no abdominal tenderness. There is no guarding or rebound.  Musculoskeletal:        General: No tenderness. Normal range of motion.     Cervical back: Normal range of motion and neck supple.  Lymphadenopathy:  Cervical: No cervical adenopathy.  Skin:    General: Skin is warm and dry.     Findings: No erythema or rash.  Neurological:     Mental Status: She is alert and oriented to person, place, and time.     Cranial Nerves: No cranial nerve deficit.     Motor: No abnormal muscle tone.     Coordination: Coordination normal.     Deep Tendon Reflexes: Reflexes are normal and symmetric.  Reflexes normal.  Psychiatric:        Behavior: Behavior normal.        Thought Content: Thought content normal.        Judgment: Judgment normal.           Assessment & Plan:  Well exam. We discussed diet and exercise. Get fasting labs. For ADHD, she will start on Adderall 20 mg BID. For depression she will try Lexapor 10 mg daily. For insomnia she will try Zolpidem 10 mg qhs. Recheck in 3-4 weeks.  Alysia Penna, MD

## 2019-11-15 NOTE — Addendum Note (Signed)
Addended by: Marrion Coy on: 11/15/2019 02:08 PM   Modules accepted: Orders

## 2019-11-15 NOTE — Addendum Note (Signed)
Addended byMatilde Sprang on: 11/15/2019 02:27 PM   Modules accepted: Orders

## 2019-11-16 LAB — LIPID PANEL
Cholesterol: 230 mg/dL — ABNORMAL HIGH (ref <200)
HDL: 78 mg/dL (ref >=50)
LDL Cholesterol (Calc): 139 mg/dL (calc) — ABNORMAL HIGH
Non-HDL Cholesterol (Calc): 152 mg/dL (calc) — ABNORMAL HIGH (ref <130)
Total CHOL/HDL Ratio: 2.9 (calc) (ref <5.0)
Triglycerides: 43 mg/dL (ref <150)

## 2019-11-16 LAB — CBC WITH DIFFERENTIAL/PLATELET
Absolute Monocytes: 383 cells/uL (ref 200–950)
Basophils Absolute: 60 cells/uL (ref 0–200)
Basophils Relative: 0.7 %
Eosinophils Absolute: 68 cells/uL (ref 15–500)
Eosinophils Relative: 0.8 %
HCT: 42.3 % (ref 35.0–45.0)
Hemoglobin: 14.1 g/dL (ref 11.7–15.5)
Lymphs Abs: 1862 cells/uL (ref 850–3900)
MCH: 32.6 pg (ref 27.0–33.0)
MCHC: 33.3 g/dL (ref 32.0–36.0)
MCV: 97.9 fL (ref 80.0–100.0)
MPV: 13.1 fL — ABNORMAL HIGH (ref 7.5–12.5)
Monocytes Relative: 4.5 %
Neutro Abs: 6129 cells/uL (ref 1500–7800)
Neutrophils Relative %: 72.1 %
Platelets: 207 10*3/uL (ref 140–400)
RBC: 4.32 10*6/uL (ref 3.80–5.10)
RDW: 11.7 % (ref 11.0–15.0)
Total Lymphocyte: 21.9 %
WBC: 8.5 10*3/uL (ref 3.8–10.8)

## 2019-11-16 LAB — HEPATIC FUNCTION PANEL
AG Ratio: 2.3 (calc) (ref 1.0–2.5)
ALT: 8 U/L (ref 6–29)
AST: 14 U/L (ref 10–30)
Albumin: 4.8 g/dL (ref 3.6–5.1)
Alkaline phosphatase (APISO): 31 U/L (ref 31–125)
Bilirubin, Direct: 0.2 mg/dL (ref 0.0–0.2)
Globulin: 2.1 g/dL (calc) (ref 1.9–3.7)
Indirect Bilirubin: 0.7 mg/dL (calc) (ref 0.2–1.2)
Total Bilirubin: 0.9 mg/dL (ref 0.2–1.2)
Total Protein: 6.9 g/dL (ref 6.1–8.1)

## 2019-11-16 LAB — BASIC METABOLIC PANEL
BUN: 13 mg/dL (ref 7–25)
CO2: 25 mmol/L (ref 20–32)
Calcium: 9.7 mg/dL (ref 8.6–10.2)
Chloride: 105 mmol/L (ref 98–110)
Creat: 0.68 mg/dL (ref 0.50–1.10)
Glucose, Bld: 71 mg/dL (ref 65–99)
Potassium: 4.2 mmol/L (ref 3.5–5.3)
Sodium: 140 mmol/L (ref 135–146)

## 2019-11-16 LAB — URINE CULTURE
MICRO NUMBER:: 10953999
Result:: NO GROWTH
SPECIMEN QUALITY:: ADEQUATE

## 2019-11-16 LAB — T4, FREE: Free T4: 1 ng/dL (ref 0.8–1.8)

## 2019-11-16 LAB — TSH: TSH: 1.9 mIU/L

## 2019-11-16 LAB — T3, FREE: T3, Free: 2.7 pg/mL (ref 2.3–4.2)

## 2019-11-17 NOTE — Telephone Encounter (Signed)
This encounter was created in error - please disregard.

## 2019-12-07 ENCOUNTER — Other Ambulatory Visit: Payer: Self-pay | Admitting: Family Medicine

## 2020-01-31 ENCOUNTER — Other Ambulatory Visit: Payer: Self-pay | Admitting: Family Medicine

## 2020-02-05 MED ORDER — AMPHETAMINE-DEXTROAMPHETAMINE 20 MG PO TABS: 20.0000 mg | ORAL_TABLET | Freq: Two times a day (BID) | ORAL | 0 refills | Status: DC

## 2020-02-05 MED ORDER — ZOLPIDEM TARTRATE 10 MG PO TABS: 10.0000 mg | ORAL_TABLET | Freq: Every day | ORAL | 5 refills | Status: DC

## 2020-02-05 NOTE — Telephone Encounter (Signed)
Done

## 2020-02-05 NOTE — Addendum Note (Signed)
Addended by: Alysia Penna A on: 02/05/2020 04:54 PM   Modules accepted: Orders

## 2020-02-05 NOTE — Telephone Encounter (Signed)
This was resent

## 2020-07-08 ENCOUNTER — Ambulatory Visit: Payer: 59 | Admitting: Family Medicine

## 2020-07-11 ENCOUNTER — Other Ambulatory Visit: Payer: Self-pay

## 2020-07-12 ENCOUNTER — Ambulatory Visit (INDEPENDENT_AMBULATORY_CARE_PROVIDER_SITE_OTHER): Payer: 59 | Admitting: Family Medicine

## 2020-07-12 ENCOUNTER — Encounter: Payer: Self-pay | Admitting: Family Medicine

## 2020-07-12 VITALS — BP 108/86 | HR 61 | Temp 98.4°F | Wt 140.0 lb

## 2020-07-12 DIAGNOSIS — Z0000 Encounter for general adult medical examination without abnormal findings: Secondary | ICD-10-CM | POA: Diagnosis not present

## 2020-07-12 LAB — LIPID PANEL
Cholesterol: 210 mg/dL — ABNORMAL HIGH (ref 0–200)
HDL: 78.3 mg/dL (ref >39.00)
LDL Cholesterol: 122 mg/dL — ABNORMAL HIGH (ref 0–99)
NonHDL: 131.69
Total CHOL/HDL Ratio: 3
Triglycerides: 48 mg/dL (ref 0.0–149.0)
VLDL: 9.6 mg/dL (ref 0.0–40.0)

## 2020-07-12 LAB — CBC WITH DIFFERENTIAL/PLATELET
Basophils Absolute: 0.1 10*3/uL (ref 0.0–0.1)
Basophils Relative: 0.8 % (ref 0.0–3.0)
Eosinophils Absolute: 0.1 10*3/uL (ref 0.0–0.7)
Eosinophils Relative: 1.9 % (ref 0.0–5.0)
HCT: 41.1 % (ref 36.0–46.0)
Hemoglobin: 13.9 g/dL (ref 12.0–15.0)
Lymphocytes Relative: 23.6 % (ref 12.0–46.0)
Lymphs Abs: 1.6 10*3/uL (ref 0.7–4.0)
MCHC: 33.8 g/dL (ref 30.0–36.0)
MCV: 97.7 fl (ref 78.0–100.0)
Monocytes Absolute: 0.3 10*3/uL (ref 0.1–1.0)
Monocytes Relative: 5 % (ref 3.0–12.0)
Neutro Abs: 4.7 10*3/uL (ref 1.4–7.7)
Neutrophils Relative %: 68.7 % (ref 43.0–77.0)
Platelets: 215 10*3/uL (ref 150.0–400.0)
RBC: 4.21 Mil/uL (ref 3.87–5.11)
RDW: 13 % (ref 11.5–15.5)
WBC: 6.9 10*3/uL (ref 4.0–10.5)

## 2020-07-12 LAB — HEPATIC FUNCTION PANEL
ALT: 10 U/L (ref 0–35)
AST: 13 U/L (ref 0–37)
Albumin: 4.6 g/dL (ref 3.5–5.2)
Alkaline Phosphatase: 36 U/L — ABNORMAL LOW (ref 39–117)
Bilirubin, Direct: 0.1 mg/dL (ref 0.0–0.3)
Total Bilirubin: 0.6 mg/dL (ref 0.2–1.2)
Total Protein: 6.9 g/dL (ref 6.0–8.3)

## 2020-07-12 LAB — BASIC METABOLIC PANEL
BUN: 13 mg/dL (ref 6–23)
CO2: 26 mEq/L (ref 19–32)
Calcium: 9.4 mg/dL (ref 8.4–10.5)
Chloride: 105 mEq/L (ref 96–112)
Creatinine, Ser: 0.63 mg/dL (ref 0.40–1.20)
GFR: 111.97 mL/min (ref >60.00)
Glucose, Bld: 87 mg/dL (ref 70–99)
Potassium: 3.9 mEq/L (ref 3.5–5.1)
Sodium: 140 mEq/L (ref 135–145)

## 2020-07-12 LAB — HEMOGLOBIN A1C: Hgb A1c MFr Bld: 5.3 % (ref 4.6–6.5)

## 2020-07-12 LAB — T3, FREE: T3, Free: 2.6 pg/mL (ref 2.3–4.2)

## 2020-07-12 LAB — TSH: TSH: 1.99 u[IU]/mL (ref 0.35–4.50)

## 2020-07-12 LAB — T4, FREE: Free T4: 0.71 ng/dL (ref 0.60–1.60)

## 2020-07-12 MED ORDER — AMPHETAMINE-DEXTROAMPHETAMINE 20 MG PO TABS: 20.0000 mg | ORAL_TABLET | Freq: Two times a day (BID) | ORAL | 0 refills | Status: DC

## 2020-07-12 MED ORDER — TEMAZEPAM 15 MG PO CAPS: 15.0000 mg | ORAL_CAPSULE | Freq: Every evening | ORAL | 5 refills | Status: DC | PRN

## 2020-07-12 MED ORDER — DOXYCYCLINE HYCLATE 100 MG PO CAPS: 100.0000 mg | ORAL_CAPSULE | Freq: Every day | ORAL | 1 refills | Status: DC
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
## 2020-11-16 ENCOUNTER — Other Ambulatory Visit: Payer: Self-pay | Admitting: Family Medicine

## 2020-11-20 MED ORDER — AMPHETAMINE-DEXTROAMPHETAMINE 20 MG PO TABS: 20.0000 mg | ORAL_TABLET | Freq: Two times a day (BID) | ORAL | 0 refills | Status: DC

## 2020-11-20 NOTE — Telephone Encounter (Signed)
Last refill- 09/11/20--60 tabs, 0 refills Last office visit- 07/12/20  No future office visit scheduled.   Can this patient receive a refill?

## 2020-11-20 NOTE — Telephone Encounter (Signed)
Pt LOV was on 07/12/2020 Last refill done on 07/12/2020 Please advise

## 2020-11-20 NOTE — Telephone Encounter (Signed)
Done

## 2021-03-05 ENCOUNTER — Other Ambulatory Visit: Payer: Self-pay | Admitting: Family Medicine

## 2021-03-05 MED ORDER — AMPHETAMINE-DEXTROAMPHETAMINE 20 MG PO TABS: 20.0000 mg | ORAL_TABLET | Freq: Two times a day (BID) | ORAL | 0 refills | Status: DC

## 2021-03-05 NOTE — Telephone Encounter (Signed)
Last refill- 01/20/21--60 tabs with 0 refills Last OV- 07-12-20  No future OV scheduled.   Can this patient receive a refill?

## 2021-03-05 NOTE — Telephone Encounter (Signed)
Done

## 2021-03-26 ENCOUNTER — Other Ambulatory Visit: Payer: Self-pay

## 2021-03-26 ENCOUNTER — Ambulatory Visit
Admission: EM | Admit: 2021-03-26 | Discharge: 2021-03-26 | Disposition: A | Discharge status: Home / Self Care | Payer: 59 | Attending: Physician Assistant | Admitting: Physician Assistant

## 2021-03-26 DIAGNOSIS — J069 Acute upper respiratory infection, unspecified: Secondary | ICD-10-CM | POA: Diagnosis not present

## 2021-03-26 NOTE — ED Triage Notes (Signed)
6 day h/o low grade intermittent fever, sneezing, congestion and cough. Tmax 100.2. No meds taken. No v/d.

## 2021-03-26 NOTE — ED Provider Notes (Signed)
[facility]    CSN: [id number] Arrival date & time: 03/26/21  1402      History   Chief Complaint Chief Complaint  Patient presents with   Fever    HPI Alison Cervantes is a 41 y.o. female.   Patient here today for evaluation of low grade fever that has been intermittent over the last 6 days. She reports that she has also had some sneezing, congestion and cough. Tmax has been 100.2 F. She denies taking any medication for symptoms.  The history is provided by the patient.   Past Medical History:  Diagnosis Date   ADHD (attention deficit hyperactivity disorder)    Anemia    Asthma    Depression    Family history of breast cancer    Family history of colon cancer    Family history of melanoma    Family history of ovarian cancer    Family history of prostate cancer    Family history of uterine cancer    Headache(784.0)    Peptic ulcer disease    Sciatica     Patient Active Problem List   Diagnosis Date Noted   COVID-19 virus infection 11/15/2019   Genetic testing 12/19/2015   Family history of breast cancer    Family history of ovarian cancer    Family history of uterine cancer    Family history of colon cancer    Family history of prostate cancer    Family history of melanoma    Chronic fatigue 06/29/2014   ADHD (attention deficit hyperactivity disorder) 03/10/2013   Alcohol abuse 03/10/2013   MIGRAINE HEADACHE 04/16/2009   PLANTAR WART 10/28/2007   INSOMNIA, CHRONIC 12/24/2006   ANEMIA-NOS 10/26/2006   Depression 10/26/2006   ASTHMA 10/26/2006   PEPTIC ULCER DISEASE 10/26/2006    History reviewed. No pertinent surgical history.  OB History   No obstetric history on file.      Home Medications    Prior to Admission medications   Medication Sig Start Date End Date Taking? Authorizing Provider  amphetamine-dextroamphetamine (ADDERALL) 20 MG tablet Take 1 tablet (20 mg total) by mouth 2 (two) times daily. 05/03/21 06/02/21  Laurey Morale,  MD  doxycycline (VIBRAMYCIN) 100 MG capsule TAKE 1 CAPSULE BY MOUTH EVERY DAY 11/20/20   Laurey Morale, MD  levonorgestrel (MIRENA) 20 MCG/24HR IUD 1 each by Intrauterine route once.    [provider]  temazepam (RESTORIL) 15 MG capsule Take 1 capsule (15 mg total) by mouth at bedtime as needed for sleep. 07/12/20   Laurey Morale, MD    Family History Family History  Problem Relation Age of Onset   Alcohol abuse Father    Heart disease Father    Hyperlipidemia Father    Hypertension Father    Hyperlipidemia Mother    Hypertension Mother    Melanoma Mother    Diabetes Maternal Grandmother    Heart disease Maternal Grandmother    Hyperlipidemia Maternal Grandmother    Hypertension Maternal Grandmother    Breast cancer Maternal Grandmother    Hyperlipidemia Paternal Grandmother    Hypertension Paternal Grandmother    Leukemia Paternal Grandmother    Brain cancer Paternal Grandmother    Cancer Paternal Grandfather        prostate, colon, stomach cancer   Diabetes Paternal Grandfather    Hyperlipidemia Paternal Grandfather    Hypertension Paternal Grandfather    Alcohol abuse Other        family hx  Breast cancer Other        1st degree family <50   Coronary artery disease Other        1st degree ><60   Diabetes Other        family hx   Hypertension Other        family hx   Lung cancer Other        family hx   Breast cancer Maternal Aunt    Uterine cancer Maternal Aunt    Ovarian cancer Maternal Aunt    Uterine cancer Cousin 55       maternal first cousin   Breast cancer Cousin        maternal cousin with breast cancer in her 74s    Social History Social History   Tobacco Use   Smoking status: Never   Smokeless tobacco: Never  Substance Use Topics   Alcohol use: No    Alcohol/week: 0.0 standard drinks   Drug use: No     Allergies   Valerian root   Review of Systems Review of Systems  Constitutional:  Positive for fever.  HENT:  Positive for  congestion. Negative for ear pain and sore throat.   Eyes:  Negative for discharge and redness.  Respiratory:  Positive for cough. Negative for shortness of breath and wheezing.   Gastrointestinal:  Negative for abdominal pain, diarrhea, nausea and vomiting.    Physical Exam Triage Vital Signs ED Triage Vitals  Enc Vitals Group     BP      Pulse      Resp      Temp      Temp src      SpO2      Weight      Height      Head Circumference      Peak Flow      Pain Score      Pain Loc      Pain Edu?      Excl. in [place]?    No data found.  Updated Vital Signs BP 116/79 (BP Location: Left Arm)    Pulse 88    Temp 99.4 F (37.4 C) (Oral)    Resp 18    SpO2 98%     Physical Exam Vitals and nursing note reviewed.  Constitutional:      General: She is not in acute distress.    Appearance: Normal appearance. She is not ill-appearing.  HENT:     Head: Normocephalic and atraumatic.     Nose: Congestion (mild) present.     Mouth/Throat:     Mouth: Mucous membranes are moist.     Pharynx: No oropharyngeal exudate or posterior oropharyngeal erythema.  Eyes:     Conjunctiva/sclera: Conjunctivae normal.  Cardiovascular:     Rate and Rhythm: Normal rate and regular rhythm.     Heart sounds: Normal heart sounds. No murmur heard. Pulmonary:     Effort: Pulmonary effort is normal. No respiratory distress.     Breath sounds: Normal breath sounds. No wheezing, rhonchi or rales.  Skin:    General: Skin is warm and dry.  Neurological:     Mental Status: She is alert.  Psychiatric:        Mood and Affect: Mood normal.        Thought Content: Thought content normal.     UC Treatments / Results  Labs (all labs ordered are listed, but only abnormal results are displayed) Labs Reviewed  COVID-19, FLU  A+B NAA    EKG   Radiology No results found.  Procedures Procedures (including critical care time)  Medications Ordered in UC Medications - No data to display  Initial  Impression / Assessment and Plan / UC Course  I have reviewed the triage vital signs and the nursing notes.  Pertinent labs & imaging results that were available during my care of the patient were reviewed by me and considered in my medical decision making (see chart for details).    Suspect viral etiology of symptoms. Recommended covid and flu screening and follow up if no improvement. Encouraged tylenol or ibuprofen if needed for symptom relief.   Final Clinical Impressions(s) / UC Diagnoses   Final diagnoses:  Acute upper respiratory infection   Discharge Instructions   None    ED Prescriptions   None    PDMP not reviewed this encounter.   Francene Finders, PA-C 03/26/21 1530

## 2021-03-28 ENCOUNTER — Encounter: Payer: Self-pay | Admitting: Family Medicine

## 2021-03-28 ENCOUNTER — Ambulatory Visit: Payer: 59 | Admitting: Family Medicine

## 2021-03-28 VITALS — BP 98/76 | HR 73 | Temp 99.1°F | Wt 143.0 lb

## 2021-03-28 DIAGNOSIS — J019 Acute sinusitis, unspecified: Secondary | ICD-10-CM | POA: Diagnosis not present

## 2021-03-28 LAB — COVID-19, FLU A+B NAA
Influenza A, NAA: NOT DETECTED
Influenza B, NAA: NOT DETECTED
SARS-CoV-2, NAA: NOT DETECTED

## 2021-03-28 MED ORDER — AZITHROMYCIN 250 MG PO TABS: ORAL_TABLET | ORAL | 0 refills | Status: DC

## 2021-03-28 NOTE — Progress Notes (Signed)
° °  Subjective:    Patient ID: Alison Cervantes, female    DOB: 10/20/1980, 41 y.o.   MRN: [id number]  HPI Here for 8 days of fevers to 100.2 degrees, stuffy head, PND, and a dry cough. No body aches or NVD. No SOB. She saw urgent care on 03-26-21, and she tested negative for Covid-19 and flu A and B. She has been drinking fluids and taking Tylenol.    Review of Systems  Constitutional:  Positive for fever.  HENT:  Positive for congestion, postnasal drip and sinus pressure. Negative for sore throat.   Eyes: Negative.   Respiratory:  Positive for cough. Negative for shortness of breath and wheezing.       Objective:   Physical Exam Constitutional:      Appearance: Normal appearance. She is not ill-appearing.  HENT:     Right Ear: Tympanic membrane, ear canal and external ear normal.     Left Ear: Tympanic membrane, ear canal and external ear normal.     Nose: Nose normal.     Mouth/Throat:     Pharynx: Oropharynx is clear.  Eyes:     Conjunctiva/sclera: Conjunctivae normal.  Pulmonary:     Effort: Pulmonary effort is normal.     Breath sounds: Normal breath sounds.  Lymphadenopathy:     Cervical: No cervical adenopathy.  Neurological:     Mental Status: She is alert.          Assessment & Plan:  Early sinusitis, treat with a Zpack.  Alysia Penna, MD

## 2021-04-06 ENCOUNTER — Encounter: Payer: Self-pay | Admitting: Family Medicine

## 2021-04-07 NOTE — Telephone Encounter (Signed)
Make her an in person OV to see me tomorrow afternoon

## 2021-04-08 ENCOUNTER — Encounter: Payer: Self-pay | Admitting: Family Medicine

## 2021-04-08 ENCOUNTER — Ambulatory Visit: Payer: 59 | Admitting: Family Medicine

## 2021-04-08 VITALS — BP 102/76 | HR 89 | Temp 99.0°F | Wt 143.0 lb

## 2021-04-08 DIAGNOSIS — F900 Attention-deficit hyperactivity disorder, predominantly inattentive type: Secondary | ICD-10-CM | POA: Diagnosis not present

## 2021-04-08 DIAGNOSIS — N390 Urinary tract infection, site not specified: Secondary | ICD-10-CM

## 2021-04-08 LAB — POC URINALSYSI DIPSTICK (AUTOMATED)
Bilirubin, UA: NEGATIVE
Blood, UA: NEGATIVE
Glucose, UA: NEGATIVE
Ketones, UA: NEGATIVE
Leukocytes, UA: NEGATIVE
Nitrite, UA: NEGATIVE
Protein, UA: NEGATIVE
Spec Grav, UA: <=1.005 — AB (ref 1.010–1.025)
Urobilinogen, UA: 0.2 E.U./dL
pH, UA: 7 (ref 5.0–8.0)

## 2021-04-08 MED ORDER — METHYLPHENIDATE HCL 20 MG PO TABS: 20.0000 mg | ORAL_TABLET | Freq: Two times a day (BID) | ORAL | 0 refills | Status: DC

## 2021-04-08 MED ORDER — CIPROFLOXACIN HCL 500 MG PO TABS: 500.0000 mg | ORAL_TABLET | Freq: Two times a day (BID) | ORAL | 0 refills | Status: DC

## 2021-04-08 NOTE — Addendum Note (Signed)
Addended by: Wyvonne Lenz on: 04/08/2021 04:02 PM   Modules accepted: Orders

## 2021-04-08 NOTE — Progress Notes (Signed)
° °  Subjective:    Patient ID: Alison Cervantes, female    DOB: June 19, 1980, 41 y.o.   MRN: [id number]  HPI Here for 2 issues. First she still feels sick from the last time we saw her on 03-28-21 but with different symptoms. That days she had stuffy head, PND, and a cough and we treated her for a sinus infection with a Zpack. These symptoms reslved but at the same time she began to notice urgency to urinate and a dark color to her urine. No back pain or nausea. She has continued to have low grade fevers for the past 2 weeks. The other issue is her ADHD medication. She has been taking Adderall for years, but it has become unavailable at the pharmacy. Sh asks if she can try an alternative.    Review of Systems  Constitutional:  Positive for fever.  HENT: Negative.    Eyes: Negative.   Respiratory: Negative.    Cardiovascular: Negative.   Gastrointestinal: Negative.   Genitourinary:  Positive for frequency and urgency. Negative for dysuria and hematuria.  Psychiatric/Behavioral:  Positive for decreased concentration. Negative for agitation, behavioral problems, confusion and dysphoric mood. The patient is not nervous/anxious.       Objective:   Physical Exam Constitutional:      Appearance: Normal appearance. She is not ill-appearing.  Cardiovascular:     Rate and Rhythm: Normal rate and regular rhythm.     Pulses: Normal pulses.     Heart sounds: Normal heart sounds.  Pulmonary:     Effort: Pulmonary effort is normal.     Breath sounds: Normal breath sounds.  Abdominal:     General: Abdomen is flat. Bowel sounds are normal. There is no distension.     Palpations: Abdomen is soft. There is no mass.     Tenderness: There is no abdominal tenderness. There is no right CVA tenderness, left CVA tenderness or guarding.     Hernia: No hernia is present.  Neurological:     General: No focal deficit present.     Mental Status: She is alert and oriented to person, place, and time.           Assessment & Plan:  She has a UTI, and we will treat this with 7 days of Cipro. We will also culture the sample. For the ADHD, she will try Ritalin 20 mg BID.  Alysia Penna, MD

## 2021-04-09 ENCOUNTER — Ambulatory Visit: Payer: 59 | Admitting: Family Medicine

## 2021-04-09 LAB — URINE CULTURE
MICRO NUMBER:: 12974877
Result:: NO GROWTH
SPECIMEN QUALITY:: ADEQUATE

## 2021-05-07 ENCOUNTER — Encounter: Payer: Self-pay | Admitting: Family Medicine

## 2021-05-08 MED ORDER — LISDEXAMFETAMINE DIMESYLATE 50 MG PO CAPS: 50.0000 mg | ORAL_CAPSULE | Freq: Every day | ORAL | 0 refills | Status: DC

## 2021-05-08 NOTE — Telephone Encounter (Signed)
I sent in to try Vyvanse 50 mg daily  ?

## 2021-06-05 MED ORDER — LISDEXAMFETAMINE DIMESYLATE 60 MG PO CAPS: 60.0000 mg | ORAL_CAPSULE | ORAL | 0 refills | Status: DC

## 2021-06-05 NOTE — Telephone Encounter (Signed)
I sent in for a month of the 60 mg dose  ?

## 2021-07-14 ENCOUNTER — Encounter: Payer: Self-pay | Admitting: Family Medicine

## 2021-07-14 ENCOUNTER — Ambulatory Visit (INDEPENDENT_AMBULATORY_CARE_PROVIDER_SITE_OTHER): Payer: 59 | Admitting: Family Medicine

## 2021-07-14 VITALS — BP 102/70 | HR 73 | Temp 98.5°F | Ht 62.0 in | Wt 144.1 lb

## 2021-07-14 DIAGNOSIS — L299 Pruritus, unspecified: Secondary | ICD-10-CM | POA: Diagnosis not present

## 2021-07-14 DIAGNOSIS — Z0000 Encounter for general adult medical examination without abnormal findings: Secondary | ICD-10-CM

## 2021-07-14 LAB — CBC WITH DIFFERENTIAL/PLATELET
Basophils Absolute: 0.1 10*3/uL (ref 0.0–0.1)
Basophils Relative: 0.7 % (ref 0.0–3.0)
Eosinophils Absolute: 0.2 10*3/uL (ref 0.0–0.7)
Eosinophils Relative: 3.2 % (ref 0.0–5.0)
HCT: 42.2 % (ref 36.0–46.0)
Hemoglobin: 14.2 g/dL (ref 12.0–15.0)
Lymphocytes Relative: 20.3 % (ref 12.0–46.0)
Lymphs Abs: 1.5 10*3/uL (ref 0.7–4.0)
MCHC: 33.7 g/dL (ref 30.0–36.0)
MCV: 97.9 fl (ref 78.0–100.0)
Monocytes Absolute: 0.4 10*3/uL (ref 0.1–1.0)
Monocytes Relative: 5.7 % (ref 3.0–12.0)
Neutro Abs: 5.3 10*3/uL (ref 1.4–7.7)
Neutrophils Relative %: 70.1 % (ref 43.0–77.0)
Platelets: 204 10*3/uL (ref 150.0–400.0)
RBC: 4.31 Mil/uL (ref 3.87–5.11)
RDW: 13.2 % (ref 11.5–15.5)
WBC: 7.5 10*3/uL (ref 4.0–10.5)

## 2021-07-14 LAB — HEPATIC FUNCTION PANEL
ALT: 13 U/L (ref 0–35)
AST: 13 U/L (ref 0–37)
Albumin: 4.6 g/dL (ref 3.5–5.2)
Alkaline Phosphatase: 28 U/L — ABNORMAL LOW (ref 39–117)
Bilirubin, Direct: 0.1 mg/dL (ref 0.0–0.3)
Total Bilirubin: 0.6 mg/dL (ref 0.2–1.2)
Total Protein: 6.9 g/dL (ref 6.0–8.3)

## 2021-07-14 LAB — LIPID PANEL
Cholesterol: 226 mg/dL — ABNORMAL HIGH (ref 0–200)
HDL: 73 mg/dL (ref >39.00)
LDL Cholesterol: 142 mg/dL — ABNORMAL HIGH (ref 0–99)
NonHDL: 153
Total CHOL/HDL Ratio: 3
Triglycerides: 56 mg/dL (ref 0.0–149.0)
VLDL: 11.2 mg/dL (ref 0.0–40.0)

## 2021-07-14 LAB — TSH: TSH: 2 u[IU]/mL (ref 0.35–5.50)

## 2021-07-14 LAB — BASIC METABOLIC PANEL
BUN: 15 mg/dL (ref 6–23)
CO2: 24 mEq/L (ref 19–32)
Calcium: 9.4 mg/dL (ref 8.4–10.5)
Chloride: 106 mEq/L (ref 96–112)
Creatinine, Ser: 0.61 mg/dL (ref 0.40–1.20)
GFR: 112.05 mL/min (ref >60.00)
Glucose, Bld: 79 mg/dL (ref 70–99)
Potassium: 3.8 mEq/L (ref 3.5–5.1)
Sodium: 138 mEq/L (ref 135–145)

## 2021-07-14 LAB — VITAMIN B12: Vitamin B-12: 416 pg/mL (ref 211–911)

## 2021-07-14 LAB — HEMOGLOBIN A1C: Hgb A1c MFr Bld: 5.2 % (ref 4.6–6.5)

## 2021-07-14 MED ORDER — TRAZODONE HCL 50 MG PO TABS: 50.0000 mg | ORAL_TABLET | Freq: Every day | ORAL | 1 refills | Status: DC

## 2021-07-14 MED ORDER — AMPHETAMINE-DEXTROAMPHETAMINE 30 MG PO TABS: 30.0000 mg | ORAL_TABLET | Freq: Two times a day (BID) | ORAL | 0 refills | Status: DC

## 2021-07-14 NOTE — Progress Notes (Signed)
? ?Subjective:  ? ? Patient ID: Alison Cervantes, female    DOB: 30-Sep-1980, 41 y.o.   MRN: [id number] ? ?HPI ?Here for a well exam. She has a few issues to discuss. She says Vyvanse does not help much for her ADHD and she wants to go back to Adderall. Also the Temazepam makes her feels groggy the next morning, so she wants to try Trazodone. She plans to see her GYN sometime soon. She also describes an itchy flaky rash over the scalp.  ? ? ?Review of Systems  ?Constitutional: Negative.   ?HENT: Negative.    ?Eyes: Negative.   ?Respiratory: Negative.    ?Cardiovascular: Negative.   ?Gastrointestinal: Negative.   ?Genitourinary:  Negative for decreased urine volume, difficulty urinating, dyspareunia, dysuria, enuresis, flank pain, frequency, hematuria, pelvic pain and urgency.  ?Musculoskeletal: Negative.   ?Skin: Negative.   ?Neurological: Negative.  Negative for headaches.  ?Psychiatric/Behavioral:  Positive for decreased concentration and sleep disturbance.   ? ?   ?Objective:  ? Physical Exam ?Constitutional:   ?   General: She is not in acute distress. ?   Appearance: Normal appearance. She is well-developed.  ?HENT:  ?   Head: Normocephalic and atraumatic.  ?   Right Ear: External ear normal.  ?   Left Ear: External ear normal.  ?   Nose: Nose normal.  ?   Mouth/Throat:  ?   Pharynx: No oropharyngeal exudate.  ?Eyes:  ?   General: No scleral icterus. ?   Conjunctiva/sclera: Conjunctivae normal.  ?   Pupils: Pupils are equal, round, and reactive to light.  ?Neck:  ?   Thyroid: No thyromegaly.  ?   Vascular: No JVD.  ?Cardiovascular:  ?   Rate and Rhythm: Normal rate and regular rhythm.  ?   Heart sounds: Normal heart sounds. No murmur heard. ?  No friction rub. No gallop.  ?Pulmonary:  ?   Effort: Pulmonary effort is normal. No respiratory distress.  ?   Breath sounds: Normal breath sounds. No wheezing or rales.  ?Chest:  ?   Chest wall: No tenderness.  ?Abdominal:  ?   General: Bowel sounds are normal. There is no  distension.  ?   Palpations: Abdomen is soft. There is no mass.  ?   Tenderness: There is no abdominal tenderness. There is no guarding or rebound.  ?Musculoskeletal:     ?   General: No tenderness. Normal range of motion.  ?   Cervical back: Normal range of motion and neck supple.  ?Lymphadenopathy:  ?   Cervical: No cervical adenopathy.  ?Skin: ?   General: Skin is warm and dry.  ?   Findings: No erythema or rash.  ?Neurological:  ?   Mental Status: She is alert and oriented to person, place, and time.  ?   Cranial Nerves: No cranial nerve deficit.  ?   Motor: No abnormal muscle tone.  ?   Coordination: Coordination normal.  ?   Deep Tendon Reflexes: Reflexes are normal and symmetric. Reflexes normal.  ?Psychiatric:     ?   Behavior: Behavior normal.     ?   Thought Content: Thought content normal.     ?   Judgment: Judgment normal.  ? ? ? ? ? ?   ?Assessment & Plan:  ?Well exam. We discussed diet and exercise. Get fasting labs. For sleep she will stop Temazepam and try trazodone 50 mg qhs. For ADHD she will stop Vyvanse and try  Adderall 30 mg BID. For the scalp rash, refer to Dermatology.  ?Alysia Penna, MD ? ? ?

## 2021-07-15 ENCOUNTER — Encounter: Payer: Self-pay | Admitting: Family Medicine

## 2021-07-16 NOTE — Telephone Encounter (Signed)
This is only important if the level is too high. There really is no "low" level so she is normal  ?

## 2021-07-18 ENCOUNTER — Encounter: Payer: Self-pay | Admitting: Family Medicine

## 2021-08-05 ENCOUNTER — Other Ambulatory Visit: Payer: Self-pay | Admitting: Family Medicine

## 2021-08-05 NOTE — Telephone Encounter (Signed)
Last OV and Rx given-07/14/21 for #30 with 1 ref

## 2021-08-19 ENCOUNTER — Encounter: Payer: Self-pay | Admitting: Family Medicine

## 2021-08-20 MED ORDER — AMPHETAMINE-DEXTROAMPHETAMINE 30 MG PO TABS: 30.0000 mg | ORAL_TABLET | Freq: Two times a day (BID) | ORAL | 0 refills | Status: DC

## 2021-08-20 NOTE — Telephone Encounter (Signed)
Done

## 2021-09-19 ENCOUNTER — Telehealth: Payer: Self-pay | Admitting: Family Medicine

## 2021-09-19 NOTE — Telephone Encounter (Signed)
Last OV- 07/14/21 Last refill- 08/20/21-60 tabs, 0 refills  No future OV scheduled.

## 2021-09-19 NOTE — Telephone Encounter (Signed)
Pt is calling and need a refill on amphetamine-dextroamphetamine (ADDERALL) 30 MG tablet  [id number] [place] [place] [address]:  [phone] Fax:  [phone]   Pt is aware is md out of office this afternoon and will be back on 09-22-2021

## 2021-09-22 NOTE — Telephone Encounter (Signed)
She already has refills until Sept. 20

## 2021-09-22 NOTE — Telephone Encounter (Signed)
Attempted to call pt regarding message, mailbox is full and no option to leave message, send a message to pt via MyChart

## 2021-11-05 ENCOUNTER — Other Ambulatory Visit: Payer: Self-pay | Admitting: Family Medicine

## 2021-11-06 NOTE — Telephone Encounter (Signed)
Last refill- 10/20/21-60 tabs, 0 refills Last OV CPE- 07/15/21  No future OV scheduled.

## 2021-11-07 MED ORDER — AMPHETAMINE-DEXTROAMPHETAMINE 30 MG PO TABS: 30.0000 mg | ORAL_TABLET | Freq: Two times a day (BID) | ORAL | 0 refills | Status: DC

## 2021-11-07 NOTE — Telephone Encounter (Signed)
Done

## 2021-12-03 ENCOUNTER — Other Ambulatory Visit: Payer: Self-pay | Admitting: Obstetrics and Gynecology

## 2021-12-03 DIAGNOSIS — R928 Other abnormal and inconclusive findings on diagnostic imaging of breast: Secondary | ICD-10-CM

## 2021-12-20 ENCOUNTER — Other Ambulatory Visit: Payer: Self-pay | Admitting: Obstetrics and Gynecology

## 2021-12-20 ENCOUNTER — Ambulatory Visit
Admission: RE | Admit: 2021-12-20 | Discharge: 2021-12-20 | Disposition: A | Discharge status: Home / Self Care | Payer: 59 | Source: Ambulatory Visit | Attending: Obstetrics and Gynecology | Admitting: Obstetrics and Gynecology

## 2021-12-20 DIAGNOSIS — R928 Other abnormal and inconclusive findings on diagnostic imaging of breast: Secondary | ICD-10-CM

## 2021-12-20 DIAGNOSIS — N6489 Other specified disorders of breast: Secondary | ICD-10-CM

## 2021-12-20 DIAGNOSIS — N6310 Unspecified lump in the right breast, unspecified quadrant: Secondary | ICD-10-CM

## 2022-02-03 ENCOUNTER — Telehealth: Payer: Self-pay | Admitting: *Deleted

## 2022-02-03 NOTE — Telephone Encounter (Signed)
Called and left the patient a message to call the office back. Patient needs to be scheduled for a new patient appt with Dr Ernestina Patches on 12/11

## 2022-02-03 NOTE — Telephone Encounter (Signed)
Patient called back . Spoke with the patient regarding the referral to GYN oncology. Patient scheduled as new patient with Dr Ernestina Patches on 12/11 t 9 am. Patient given an arrival time of 8:30 am.  Explained to the patient the the doctor will perform a pelvic exam at this visit. Patient given the policy that no visitors under the 16 yrs are allowed in the [place] [place]. Patient given the address/phone number for the clinic and that the center offers free valet service.

## 2022-02-09 ENCOUNTER — Encounter: Payer: Self-pay | Admitting: Oncology

## 2022-02-09 ENCOUNTER — Inpatient Hospital Stay (HOSPITAL_BASED_OUTPATIENT_CLINIC_OR_DEPARTMENT_OTHER): Payer: 59 | Admitting: Gynecologic Oncology

## 2022-02-09 ENCOUNTER — Inpatient Hospital Stay: Payer: 59 | Attending: Psychiatry | Admitting: Psychiatry

## 2022-02-09 ENCOUNTER — Encounter: Payer: Self-pay | Admitting: Psychiatry

## 2022-02-09 ENCOUNTER — Other Ambulatory Visit: Payer: Self-pay

## 2022-02-09 VITALS — BP 117/77 | HR 72 | Temp 98.3°F | Resp 16 | Ht 62.0 in | Wt 150.0 lb

## 2022-02-09 DIAGNOSIS — Z79899 Other long term (current) drug therapy: Secondary | ICD-10-CM | POA: Diagnosis not present

## 2022-02-09 DIAGNOSIS — F909 Attention-deficit hyperactivity disorder, unspecified type: Secondary | ICD-10-CM | POA: Diagnosis not present

## 2022-02-09 DIAGNOSIS — Z8041 Family history of malignant neoplasm of ovary: Secondary | ICD-10-CM | POA: Diagnosis not present

## 2022-02-09 DIAGNOSIS — R32 Unspecified urinary incontinence: Secondary | ICD-10-CM | POA: Insufficient documentation

## 2022-02-09 DIAGNOSIS — R350 Frequency of micturition: Secondary | ICD-10-CM | POA: Insufficient documentation

## 2022-02-09 DIAGNOSIS — B977 Papillomavirus as the cause of diseases classified elsewhere: Secondary | ICD-10-CM | POA: Insufficient documentation

## 2022-02-09 DIAGNOSIS — C531 Malignant neoplasm of exocervix: Secondary | ICD-10-CM | POA: Diagnosis not present

## 2022-02-09 DIAGNOSIS — Z8049 Family history of malignant neoplasm of other genital organs: Secondary | ICD-10-CM | POA: Diagnosis not present

## 2022-02-09 DIAGNOSIS — C539 Malignant neoplasm of cervix uteri, unspecified: Secondary | ICD-10-CM

## 2022-02-09 DIAGNOSIS — Z803 Family history of malignant neoplasm of breast: Secondary | ICD-10-CM | POA: Insufficient documentation

## 2022-02-09 DIAGNOSIS — Z800 Family history of malignant neoplasm of digestive organs: Secondary | ICD-10-CM | POA: Diagnosis not present

## 2022-02-09 DIAGNOSIS — Z975 Presence of (intrauterine) contraceptive device: Secondary | ICD-10-CM | POA: Diagnosis not present

## 2022-02-09 DIAGNOSIS — Z808 Family history of malignant neoplasm of other organs or systems: Secondary | ICD-10-CM | POA: Diagnosis not present

## 2022-02-09 DIAGNOSIS — F32A Depression, unspecified: Secondary | ICD-10-CM | POA: Diagnosis not present

## 2022-02-09 DIAGNOSIS — Z8042 Family history of malignant neoplasm of prostate: Secondary | ICD-10-CM | POA: Insufficient documentation

## 2022-02-09 DIAGNOSIS — J45909 Unspecified asthma, uncomplicated: Secondary | ICD-10-CM | POA: Diagnosis not present

## 2022-02-09 MED ORDER — SENNOSIDES-DOCUSATE SODIUM 8.6-50 MG PO TABS: 2.0000 | ORAL_TABLET | Freq: Every day | ORAL | 0 refills | Status: DC

## 2022-02-09 MED ORDER — TRAMADOL HCL 50 MG PO TABS: 50.0000 mg | ORAL_TABLET | Freq: Four times a day (QID) | ORAL | 0 refills | Status: DC | PRN

## 2022-02-09 MED ORDER — IBUPROFEN 800 MG PO TABS: 800.0000 mg | ORAL_TABLET | Freq: Two times a day (BID) | ORAL | 0 refills | Status: DC | PRN

## 2022-02-09 NOTE — Progress Notes (Signed)
Met with Deeanne during her new patient appointment with Dr. Ernestina Patches.  Discussed the role of nurse navigator and provided her with my contact information.  Encouraged her to call with any questions or concerns.

## 2022-02-09 NOTE — Patient Instructions (Addendum)
Plan to have a PET scan prior to surgery to evaluate for any signs of cancer spread and you will be informed of the results.  Preparing for your Surgery  Plan for surgery on February 24, 2022 with Dr. Bernadene Bell at [facility]. You will be scheduled for pelvic examination under anesthesia, cold knife conization of the cervix (cone shaped biopsy of the cervix), endocervical curettage (sampling from the inner canal of the cervix).   Pre-operative Testing -You will receive a phone call from presurgical testing at [facility] to discuss surgery instructions and arrange for lab work if needed.  -Bring your insurance card, copy of an advanced directive if applicable, medication list.  -You should not be taking blood thinners or aspirin at least ten days prior to surgery unless instructed by your surgeon.  -Do not take supplements such as fish oil (omega 3), red yeast rice, turmeric before your surgery. You want to avoid medications with aspirin in them including headache powders such as BC or Goody's), Excedrin migraine.  Day Before Surgery at [place] will be advised you can have clear liquids up until 3 hours before your surgery.    Your role in recovery Your role is to become active as soon as directed by your doctor, while still giving yourself time to heal.  Rest when you feel tired. You will be asked to do the following in order to speed your recovery:  - Cough and breathe deeply. This helps to clear and expand your lungs and can prevent pneumonia after surgery.  - [place]. Do mild physical activity. Walking or moving your legs help your circulation and body functions return to normal. Do not try to get up or walk alone the first time after surgery.   -If you develop swelling on one leg or the other, pain in the back of your leg, redness/warmth in one of your legs, please call the office or go to the Emergency Room to have a  doppler to rule out a blood clot. For shortness of breath, chest pain-seek care in the Emergency Room as soon as possible. - Actively manage your pain. Managing your pain lets you move in comfort. We will ask you to rate your pain on a scale of zero to 10. It is your responsibility to tell your doctor or nurse where and how much you hurt so your pain can be treated.  Special Considerations -Your final pathology results from surgery should be available around one week after surgery and the results will be relayed to you when available.  -FMLA forms can be faxed to [phone] and please allow 5-7 business days for completion.  Pain Management After Surgery -You will be prescribed your pain medication and bowel regimen medications before surgery so that you can have these available when you are discharged from the hospital. The pain medication is for use ONLY AFTER surgery and a new prescription will not be given.   -Make sure that you have Tylenol and Ibuprofen at home IF [place] to use on a regular basis after surgery for pain control. We recommend alternating the medications every hour to six hours since they work differently and are processed in the body differently for pain relief.  -Review the attached handout on narcotic use and their risks and side effects.   Bowel Regimen -You will be prescribed Sennakot-S to take nightly to prevent constipation especially if you  are taking the narcotic pain medication intermittently.  It is important to prevent constipation and drink adequate amounts of liquids. You can stop taking this medication when you are not taking pain medication and you are back on your normal bowel routine.  Risks of Surgery Risks of surgery are low but include bleeding, infection, damage to surrounding structures, re-operation, blood clots, and very rarely death.  AFTER SURGERY INSTRUCTIONS  Return to work:  variable based on occupation, 2-3  days  Activity: 1. Be up and out of the bed during the day.  Take a nap if needed.  You may walk up steps but be careful and use the hand rail.  Stair climbing will tire you more than you think, you may need to stop part way and rest.   2. No lifting or straining for 2 weeks over 10 pounds minimum. No pushing, pulling, straining for 2 weeks.  3. No driving for minimum 24 hours after surgery.  Do not drive if you are taking narcotic pain medicine and make sure that your reaction time has returned.   4. You can shower as soon as the next day after surgery. Shower daily. No tub baths or submerging your body in water until cleared by your surgeon. If you have the soap that was given to you by pre-surgical testing that was used before surgery, you do not need to use it afterwards because this can irritate your incisions.   5. No sexual activity and nothing in the vagina for 4 weeks (until seen in the office).  6. You may experience vaginal spotting and discharge after surgery.  The spotting is normal but if you experience heavy bleeding, call our office.  7. Take Tylenol or ibuprofen first for pain if you are able to take these medications and only use narcotic pain medication for severe pain not relieved by the Tylenol or Ibuprofen.  Monitor your Tylenol intake to a max of 4,000 mg in a 24 hour period. You can alternate these medications after surgery.  Diet: 1. Low sodium Heart Healthy Diet is recommended but you are cleared to resume your normal (before surgery) diet after your procedure.  2. It is safe to use a laxative, such as Miralax or Colace, if you have difficulty moving your bowels. You have been prescribed Sennakot at bedtime every evening to keep bowel movements regular and to prevent constipation.    Wound Care: 1. Keep clean and dry.  Shower daily.  Reasons to call the Doctor: Fever - Oral temperature greater than 100.4 degrees Fahrenheit Foul-smelling vaginal  discharge Difficulty urinating Nausea and vomiting Increased pain at the site of the incision that is unrelieved with pain medicine. Difficulty breathing with or without chest pain New calf pain especially if only on one side Sudden, continuing increased vaginal bleeding with or without clots.   Contacts: For questions or concerns you should contact:  Dr. Bernadene Bell at [place], NP at [phone]  After Hours: call [phone] and have the GYN Oncologist paged/contacted (after 5 pm or on the weekends).  Messages sent via mychart are for non-urgent matters and are not responded to after hours so for urgent needs, please call the after hours number.

## 2022-02-09 NOTE — Progress Notes (Unsigned)
GYNECOLOGIC ONCOLOGY NEW PATIENT CONSULTATION  Date of Service: 02/09/2022 Referring Provider: Irene Pap, MD   ASSESSMENT AND PLAN: Alison Cervantes is a 41 y.o. woman with new diagnosis of squamous cell carcinoma of the cervix.  We reviewed the patient's diagnosis of early stage cervical cancer.  On today's examination, she is a clinical stage I with no gross mass but with hyperglandular appearance circumferentially around the external cervical os (only posterior biopsy was positive for invasive cancer). The patient was counseled extensively on the treatment options for an early cervical cancer which include radical hysterectomy with pelvic lymphadenectomy or chemoradiation.  The risks and benefits of both were reviewed.  I have emphasized that the cure rates for these two treatments are equivalent; however, I discussed that the toxicity profiles are different between these two modalities.    Reviewed newer data from [facility] trial that demonstrated that some early stage cervical cancer patients could be appropriate candidates for an extrafascial hysterectomy.  In order to determine this, would need to undergo a cervical conization to confirm size of tumor, presence or absence of LVSI, and obtain negative margins.  Following conization, would wait at least 6 weeks prior to definitive surgical treatment.  Pending results, outcomes could include recommendation for extrafascial hysterectomy, radical hysterectomy, or chemoradiation.  Risks of primary radiotherapy with sensitizing chemotherapy can include cystitis, proctitis, chronic rectal bleeding sigmoid stricture, small bowel obstruction, ureteral stricture, urinary or enteral fistula, loss of sexual function related to vaginal shortening and atrophy and menopause due to ovarian ablation.   Surgery is associated with risks of bleeding, blood transfusion, injury to the bowel, bladder, ureter, urinary fistula, vaginal dehiscence, prolonged or  permanent bladder dysfunction potentially requiring self-catheterization, lymphedema, infection, thromboembolic events, and possible effects on sexual function. She understands that intraoperatively, the finding of previously unsuspected spread outside the cervix may lead to the radical hysterectomy being aborted.  Additionally, high or intermediate-risk factors on the final pathology report, such as positive lymph nodes, deep invasion, large tumor size and lymphatic invasion, will likely lead to a recommendation of postoperative chemotherapy and radiation.   She desires to proceed with cervical conization followed by likely surgical management of disease if continues to be an appropriate candidate.  Prior to surgery, a PET/CT has been ordered to rule out any distant metastasis that would preclude the patient from being a surgical candidate. Scheduled for 12/22.   Patient was consented for: cold knife conization, endocervical curettage on 12/26.  The risks of surgery were discussed in detail and she understands these to including but not limited to bleeding requiring a blood transfusion, infection, injury to adjacent organs (including but not limited to the bowels, bladder, ureters, nerves, blood vessels), unforseen complication, and possible need for re-exploration.  If the patient experiences any of these events, she understands that her hospitalization or recovery may be prolonged and that she may need to take additional medications for a prolonged period. The patient will receive DVT and antibiotic prophylaxis as indicated. She voiced a clear understanding. She had the opportunity to ask questions and written informed consent was obtained today. She wishes to proceed.  She does not require preoperative clearance. Her METs are >4.  All preoperative instructions were reviewed. Postoperative expectations were also reviewed.   A copy of this note was sent to the patient's referring  provider.  Bernadene Bell, MD Gynecologic Oncology   Medical Decision Making I personally spent  TOTAL 53 minutes face-to-face and non-face-to-face in the care of this patient,  which includes all pre, intra, and post visit time on the date of service.  3 minutes spent reviewing records prior to the visit 45 Minutes in patient contact 5 minutes charting , conferring with consultants etc.   ------------  CC: Cervical cancer  HISTORY OF PRESENT ILLNESS:  Alison Cervantes is a 41 y.o. woman who is seen in consultation at the request of Irene Pap, MD for evaluation of cervical cancer.  Patient presented for her annual exam and underwent a Pap smear which returned with ASCUS HPV 16 positive.  She then returned for a colpo with cervical biopsies obtained and ECC.  The biopsies resulted with invasive squamous cell carcinoma at 5:00 and H SIL otherwise.  Today patient presents with her spouse.  She reports that she has been experiencing some postcoital bleeding within the last few months.  She also notes increased urinary incontinence and urinary frequency.  She otherwise denies abdominal bloating, early satiety, significant weight loss, change in bowel habits.   TREATMENT HISTORY: Oncology History  Malignant neoplasm of exocervix ([place])  10/20/2021 Pathology Results   ASCUS, HPV16+   01/27/2022 Procedure   Colposcopy: Cervical biopsy at 5:00: Invasive squamous cell carcinoma, HPV-associated Cervical biopsy 11:00: HSIL Cervical biopsy 12:00 H SIL ECC: HSIL   01/27/2022 Initial Diagnosis   Malignant neoplasm of exocervix ([place])     PAST MEDICAL HISTORY: Past Medical History:  Diagnosis Date   ADHD (attention deficit hyperactivity disorder)    Anemia    Asthma    Depression    Family history of breast cancer    Family history of colon cancer    Family history of melanoma    Family history of ovarian cancer    Family history of prostate cancer    Family history of  uterine cancer    Headache(784.0)    Peptic ulcer disease    Sciatica     PAST SURGICAL HISTORY: History reviewed. No pertinent surgical history.  OB/GYN HISTORY: OB History  Gravida Para Term Preterm AB Living  '3 3 3     3  '$ SAB IAB Ectopic Multiple Live Births          3    # Outcome Date GA Lbr Len/2nd Weight Sex Delivery Anes PTL Lv  3 Term      Vag-Spont   LIV  2 Term      Vag-Spont   LIV  1 Term      Vag-Spont   LIV      Age at menarche: 89 Age at menopause: N/A Hx of HRT: N/A Hx of STI: No Last pap: 2023 see above History of abnormal pap smears: colpo 2015  SCREENING STUDIES:  Last mammogram: 11/2021 Last colonoscopy: N/A  MEDICATIONS:  Current Outpatient Medications:    amphetamine-dextroamphetamine (ADDERALL) 30 MG tablet, Take 1 tablet by mouth 2 (two) times daily., Disp: 60 tablet, Rfl: 0   ibuprofen (ADVIL) 800 MG tablet, Take 1 tablet (800 mg total) by mouth 2 (two) times daily as needed for moderate pain. For AFTER surgery only, Disp: 30 tablet, Rfl: 0   levonorgestrel (MIRENA) 20 MCG/24HR IUD, 1 each by Intrauterine route once., Disp: , Rfl:    senna-docusate (SENOKOT-S) 8.6-50 MG tablet, Take 2 tablets by mouth at bedtime. For AFTER surgery, do not take if having diarrhea, Disp: 30 tablet, Rfl: 0   traMADol (ULTRAM) 50 MG tablet, Take 1 tablet (50 mg total) by mouth every 6 (six) hours as needed for moderate pain. For AFTER  surgery only, do not take and drive, Disp: 5 tablet, Rfl: 0  ALLERGIES: Allergies  Allergen Reactions   Valerian Root Rash    FAMILY HISTORY: Family History  Problem Relation Age of Onset   Hyperlipidemia Mother    Hypertension Mother    Melanoma Mother    Alcohol abuse Father    Heart disease Father    Hyperlipidemia Father    Hypertension Father    Diabetes Maternal Grandmother    Heart disease Maternal Grandmother    Hyperlipidemia Maternal Grandmother    Hypertension Maternal Grandmother    Breast cancer Maternal  Grandmother    Hyperlipidemia Paternal Grandmother    Hypertension Paternal Grandmother    Leukemia Paternal Grandmother    Brain cancer Paternal Grandmother    Prostate cancer Paternal Grandfather    Diabetes Paternal Grandfather    Hyperlipidemia Paternal Grandfather    Hypertension Paternal Grandfather    Colon cancer Paternal Grandfather    Stomach cancer Paternal Grandfather    Breast cancer Maternal Aunt    Uterine cancer Maternal Aunt    Ovarian cancer Maternal Aunt    Uterine cancer Cousin 70       maternal first cousin   Breast cancer Cousin        maternal cousin with breast cancer in her 44s   Alcohol abuse Other        family hx   Breast cancer Other        1st degree family <50   Coronary artery disease Other        1st degree ><60   Diabetes Other        family hx   Hypertension Other        family hx   Lung cancer Other        family hx    SOCIAL HISTORY: Social History   Socioeconomic History   Marital status: Significant Other    Spouse name: Not on file   Number of children: Not on file   Years of education: Not on file   Highest education level: Not on file  Occupational History   Not on file  Tobacco Use   Smoking status: Never   Smokeless tobacco: Never  Substance and Sexual Activity   Alcohol use: No    Alcohol/week: 0.0 standard drinks of alcohol   Drug use: No   Sexual activity: Not on file  Other Topics Concern   Not on file  Social History Narrative   Not on file   Social Determinants of Health   Financial Resource Strain: Not on file  Food Insecurity: Not on file  Transportation Needs: Not on file  Physical Activity: Not on file  Stress: Not on file  Social Connections: Not on file  Intimate Partner Violence: Not on file    REVIEW OF SYSTEMS: New patient intake form was reviewed.  Complete 10-system review is negative except for the following: Urinary frequency, rash, bruising, vaginal bleeding, itching  PHYSICAL  EXAM: BP 117/77 (BP Location: Left Arm, Patient Position: Sitting)   Pulse 72   Temp 98.3 F (36.8 C) (Oral)   Resp 16   Ht '5\' 2"'$  (1.575 m)   Wt 150 lb (68 kg)   SpO2 100%   BMI 27.44 kg/m  Constitutional: No acute distress. Neuro/Psych: Alert, oriented.  Head and Neck: Normocephalic, atraumatic. Neck symmetric without masses. Sclera anicteric.  Respiratory: Normal work of breathing. Clear to auscultation bilaterally. Cardiovascular: Regular rate and rhythm, no murmurs, rubs,  or gallops. Abdomen: Normoactive bowel sounds. Soft, non-distended, non-tender to palpation. No masses or hepatosplenomegaly appreciated. Extremities: Grossly normal range of motion. Warm, well perfused. No edema bilaterally. Skin: No rashes or lesions. Lymphatic: No cervical, supraclavicular, or inguinal adenopathy. Genitourinary: \External genitalia without lesions. Urethral meatus without lesions or prolapse. On speculum exam, vagina without lesions.  Cervix multiparous in appearance with red hyper glandular and friable appearance circumferentially around the external cervical os about 69m in all directions. Bimanual exam reveals small nodular firmness of the posterior lip of the cervix at the external cervical os. Rectovaginal exam confirms the above findings and reveals normal sphincter tone and no masses or nodularity.  No parametrial involvement.  Exam chaperoned by MJoylene John NP   LABORATORY AND RADIOLOGIC DATA: Outside medical records were reviewed to synthesize the above history, along with the history and physical obtained during the visit.  Outside laboratory, pathology reports were reviewed, with pertinent results below.    WBC  Date Value Ref Range Status  07/14/2021 7.5 4.0 - 10.5 K/uL Final   Hemoglobin  Date Value Ref Range Status  07/14/2021 14.2 12.0 - 15.0 g/dL Final   HCT  Date Value Ref Range Status  07/14/2021 42.2 36.0 - 46.0 % Final   Platelets  Date Value Ref Range Status   07/14/2021 204.0 150.0 - 400.0 K/uL Final   Creat  Date Value Ref Range Status  11/15/2019 0.68 0.50 - 1.10 mg/dL Final   Creatinine, Ser  Date Value Ref Range Status  07/14/2021 0.61 0.40 - 1.20 mg/dL Final   AST  Date Value Ref Range Status  07/14/2021 13 0 - 37 U/L Final   ALT  Date Value Ref Range Status  07/14/2021 13 0 - 35 U/L Final    Surgical pathology (01/27/22): Cervical biopsy at 5:00: Invasive squamous cell carcinoma, HPV-associated Cervical biopsy 11:00: HSIL Cervical biopsy 12:00 H SIL ECC: HSIL  Pap (10/20/21): ASCUS, HPV16+  Surgical pathology (09/13/13): Cervical biopsy: Benign ECC: Benign

## 2022-02-09 NOTE — H&P (View-Only) (Signed)
GYNECOLOGIC ONCOLOGY NEW PATIENT CONSULTATION  Date of Service: 02/09/2022 Referring Provider: Irene Pap, MD   ASSESSMENT AND PLAN: Alison Cervantes is a 41 y.o. woman with new diagnosis of squamous cell carcinoma of the cervix.  We reviewed the patient's diagnosis of early stage cervical cancer.  On today's examination, she is a clinical stage I with no gross mass but with hyperglandular appearance circumferentially around the external cervical os (only posterior biopsy was positive for invasive cancer). The patient was counseled extensively on the treatment options for an early cervical cancer which include radical hysterectomy with pelvic lymphadenectomy or chemoradiation.  The risks and benefits of both were reviewed.  I have emphasized that the cure rates for these two treatments are equivalent; however, I discussed that the toxicity profiles are different between these two modalities.    Reviewed newer data from [facility] trial that demonstrated that some early stage cervical cancer patients could be appropriate candidates for an extrafascial hysterectomy.  In order to determine this, would need to undergo a cervical conization to confirm size of tumor, presence or absence of LVSI, and obtain negative margins.  Following conization, would wait at least 6 weeks prior to definitive surgical treatment.  Pending results, outcomes could include recommendation for extrafascial hysterectomy, radical hysterectomy, or chemoradiation.  Risks of primary radiotherapy with sensitizing chemotherapy can include cystitis, proctitis, chronic rectal bleeding sigmoid stricture, small bowel obstruction, ureteral stricture, urinary or enteral fistula, loss of sexual function related to vaginal shortening and atrophy and menopause due to ovarian ablation.   Surgery is associated with risks of bleeding, blood transfusion, injury to the bowel, bladder, ureter, urinary fistula, vaginal dehiscence, prolonged or  permanent bladder dysfunction potentially requiring self-catheterization, lymphedema, infection, thromboembolic events, and possible effects on sexual function. She understands that intraoperatively, the finding of previously unsuspected spread outside the cervix may lead to the radical hysterectomy being aborted.  Additionally, high or intermediate-risk factors on the final pathology report, such as positive lymph nodes, deep invasion, large tumor size and lymphatic invasion, will likely lead to a recommendation of postoperative chemotherapy and radiation.   She desires to proceed with cervical conization followed by likely surgical management of disease if continues to be an appropriate candidate.  Prior to surgery, a PET/CT has been ordered to rule out any distant metastasis that would preclude the patient from being a surgical candidate. Scheduled for 12/22.   Patient was consented for: cold knife conization, endocervical curettage on 12/26.  The risks of surgery were discussed in detail and she understands these to including but not limited to bleeding requiring a blood transfusion, infection, injury to adjacent organs (including but not limited to the bowels, bladder, ureters, nerves, blood vessels), unforseen complication, and possible need for re-exploration.  If the patient experiences any of these events, she understands that her hospitalization or recovery may be prolonged and that she may need to take additional medications for a prolonged period. The patient will receive DVT and antibiotic prophylaxis as indicated. She voiced a clear understanding. She had the opportunity to ask questions and written informed consent was obtained today. She wishes to proceed.  She does not require preoperative clearance. Her METs are >4.  All preoperative instructions were reviewed. Postoperative expectations were also reviewed.   A copy of this note was sent to the patient's referring  provider.  Bernadene Bell, MD Gynecologic Oncology   Medical Decision Making I personally spent  TOTAL 53 minutes face-to-face and non-face-to-face in the care of this patient,  which includes all pre, intra, and post visit time on the date of service.  3 minutes spent reviewing records prior to the visit 45 Minutes in patient contact 5 minutes charting , conferring with consultants etc.   ------------  CC: Cervical cancer  HISTORY OF PRESENT ILLNESS:  Alison Cervantes is a 41 y.o. woman who is seen in consultation at the request of Irene Pap, MD for evaluation of cervical cancer.  Patient presented for her annual exam and underwent a Pap smear which returned with ASCUS HPV 16 positive.  She then returned for a colpo with cervical biopsies obtained and ECC.  The biopsies resulted with invasive squamous cell carcinoma at 5:00 and H SIL otherwise.  Today patient presents with her spouse.  She reports that she has been experiencing some postcoital bleeding within the last few months.  She also notes increased urinary incontinence and urinary frequency.  She otherwise denies abdominal bloating, early satiety, significant weight loss, change in bowel habits.   TREATMENT HISTORY: Oncology History  Malignant neoplasm of exocervix ([place])  10/20/2021 Pathology Results   ASCUS, HPV16+   01/27/2022 Procedure   Colposcopy: Cervical biopsy at 5:00: Invasive squamous cell carcinoma, HPV-associated Cervical biopsy 11:00: HSIL Cervical biopsy 12:00 H SIL ECC: HSIL   01/27/2022 Initial Diagnosis   Malignant neoplasm of exocervix ([place])     PAST MEDICAL HISTORY: Past Medical History:  Diagnosis Date   ADHD (attention deficit hyperactivity disorder)    Anemia    Asthma    Depression    Family history of breast cancer    Family history of colon cancer    Family history of melanoma    Family history of ovarian cancer    Family history of prostate cancer    Family history of  uterine cancer    Headache(784.0)    Peptic ulcer disease    Sciatica     PAST SURGICAL HISTORY: History reviewed. No pertinent surgical history.  OB/GYN HISTORY: OB History  Gravida Para Term Preterm AB Living  '3 3 3     3  '$ SAB IAB Ectopic Multiple Live Births          3    # Outcome Date GA Lbr Len/2nd Weight Sex Delivery Anes PTL Lv  3 Term      Vag-Spont   LIV  2 Term      Vag-Spont   LIV  1 Term      Vag-Spont   LIV      Age at menarche: 49 Age at menopause: N/A Hx of HRT: N/A Hx of STI: No Last pap: 2023 see above History of abnormal pap smears: colpo 2015  SCREENING STUDIES:  Last mammogram: 11/2021 Last colonoscopy: N/A  MEDICATIONS:  Current Outpatient Medications:    amphetamine-dextroamphetamine (ADDERALL) 30 MG tablet, Take 1 tablet by mouth 2 (two) times daily., Disp: 60 tablet, Rfl: 0   ibuprofen (ADVIL) 800 MG tablet, Take 1 tablet (800 mg total) by mouth 2 (two) times daily as needed for moderate pain. For AFTER surgery only, Disp: 30 tablet, Rfl: 0   levonorgestrel (MIRENA) 20 MCG/24HR IUD, 1 each by Intrauterine route once., Disp: , Rfl:    senna-docusate (SENOKOT-S) 8.6-50 MG tablet, Take 2 tablets by mouth at bedtime. For AFTER surgery, do not take if having diarrhea, Disp: 30 tablet, Rfl: 0   traMADol (ULTRAM) 50 MG tablet, Take 1 tablet (50 mg total) by mouth every 6 (six) hours as needed for moderate pain. For AFTER  surgery only, do not take and drive, Disp: 5 tablet, Rfl: 0  ALLERGIES: Allergies  Allergen Reactions   Valerian Root Rash    FAMILY HISTORY: Family History  Problem Relation Age of Onset   Hyperlipidemia Mother    Hypertension Mother    Melanoma Mother    Alcohol abuse Father    Heart disease Father    Hyperlipidemia Father    Hypertension Father    Diabetes Maternal Grandmother    Heart disease Maternal Grandmother    Hyperlipidemia Maternal Grandmother    Hypertension Maternal Grandmother    Breast cancer Maternal  Grandmother    Hyperlipidemia Paternal Grandmother    Hypertension Paternal Grandmother    Leukemia Paternal Grandmother    Brain cancer Paternal Grandmother    Prostate cancer Paternal Grandfather    Diabetes Paternal Grandfather    Hyperlipidemia Paternal Grandfather    Hypertension Paternal Grandfather    Colon cancer Paternal Grandfather    Stomach cancer Paternal Grandfather    Breast cancer Maternal Aunt    Uterine cancer Maternal Aunt    Ovarian cancer Maternal Aunt    Uterine cancer Cousin 39       maternal first cousin   Breast cancer Cousin        maternal cousin with breast cancer in her 68s   Alcohol abuse Other        family hx   Breast cancer Other        1st degree family <50   Coronary artery disease Other        1st degree ><60   Diabetes Other        family hx   Hypertension Other        family hx   Lung cancer Other        family hx    SOCIAL HISTORY: Social History   Socioeconomic History   Marital status: Significant Other    Spouse name: Not on file   Number of children: Not on file   Years of education: Not on file   Highest education level: Not on file  Occupational History   Not on file  Tobacco Use   Smoking status: Never   Smokeless tobacco: Never  Substance and Sexual Activity   Alcohol use: No    Alcohol/week: 0.0 standard drinks of alcohol   Drug use: No   Sexual activity: Not on file  Other Topics Concern   Not on file  Social History Narrative   Not on file   Social Determinants of Health   Financial Resource Strain: Not on file  Food Insecurity: Not on file  Transportation Needs: Not on file  Physical Activity: Not on file  Stress: Not on file  Social Connections: Not on file  Intimate Partner Violence: Not on file    REVIEW OF SYSTEMS: New patient intake form was reviewed.  Complete 10-system review is negative except for the following: Urinary frequency, rash, bruising, vaginal bleeding, itching  PHYSICAL  EXAM: BP 117/77 (BP Location: Left Arm, Patient Position: Sitting)   Pulse 72   Temp 98.3 F (36.8 C) (Oral)   Resp 16   Ht '5\' 2"'$  (1.575 m)   Wt 150 lb (68 kg)   SpO2 100%   BMI 27.44 kg/m  Constitutional: No acute distress. Neuro/Psych: Alert, oriented.  Head and Neck: Normocephalic, atraumatic. Neck symmetric without masses. Sclera anicteric.  Respiratory: Normal work of breathing. Clear to auscultation bilaterally. Cardiovascular: Regular rate and rhythm, no murmurs, rubs,  or gallops. Abdomen: Normoactive bowel sounds. Soft, non-distended, non-tender to palpation. No masses or hepatosplenomegaly appreciated. Extremities: Grossly normal range of motion. Warm, well perfused. No edema bilaterally. Skin: No rashes or lesions. Lymphatic: No cervical, supraclavicular, or inguinal adenopathy. Genitourinary: \External genitalia without lesions. Urethral meatus without lesions or prolapse. On speculum exam, vagina without lesions.  Cervix multiparous in appearance with red hyper glandular and friable appearance circumferentially around the external cervical os about 69m in all directions. Bimanual exam reveals small nodular firmness of the posterior lip of the cervix at the external cervical os. Rectovaginal exam confirms the above findings and reveals normal sphincter tone and no masses or nodularity.  No parametrial involvement.  Exam chaperoned by MJoylene John NP   LABORATORY AND RADIOLOGIC DATA: Outside medical records were reviewed to synthesize the above history, along with the history and physical obtained during the visit.  Outside laboratory, pathology reports were reviewed, with pertinent results below.    WBC  Date Value Ref Range Status  07/14/2021 7.5 4.0 - 10.5 K/uL Final   Hemoglobin  Date Value Ref Range Status  07/14/2021 14.2 12.0 - 15.0 g/dL Final   HCT  Date Value Ref Range Status  07/14/2021 42.2 36.0 - 46.0 % Final   Platelets  Date Value Ref Range Status   07/14/2021 204.0 150.0 - 400.0 K/uL Final   Creat  Date Value Ref Range Status  11/15/2019 0.68 0.50 - 1.10 mg/dL Final   Creatinine, Ser  Date Value Ref Range Status  07/14/2021 0.61 0.40 - 1.20 mg/dL Final   AST  Date Value Ref Range Status  07/14/2021 13 0 - 37 U/L Final   ALT  Date Value Ref Range Status  07/14/2021 13 0 - 35 U/L Final    Surgical pathology (01/27/22): Cervical biopsy at 5:00: Invasive squamous cell carcinoma, HPV-associated Cervical biopsy 11:00: HSIL Cervical biopsy 12:00 H SIL ECC: HSIL  Pap (10/20/21): ASCUS, HPV16+  Surgical pathology (09/13/13): Cervical biopsy: Benign ECC: Benign

## 2022-02-10 ENCOUNTER — Other Ambulatory Visit: Payer: Self-pay | Admitting: Family Medicine

## 2022-02-10 ENCOUNTER — Encounter: Payer: Self-pay | Admitting: Psychiatry

## 2022-02-10 DIAGNOSIS — C531 Malignant neoplasm of exocervix: Secondary | ICD-10-CM | POA: Insufficient documentation

## 2022-02-12 ENCOUNTER — Other Ambulatory Visit: Payer: Self-pay

## 2022-02-12 MED ORDER — AMPHETAMINE-DEXTROAMPHETAMINE 30 MG PO TABS: 30.0000 mg | ORAL_TABLET | Freq: Two times a day (BID) | ORAL | 0 refills | Status: DC

## 2022-02-12 NOTE — Telephone Encounter (Signed)
Done

## 2022-02-12 NOTE — Patient Instructions (Signed)
Plan to have a PET scan prior to surgery to evaluate for any signs of cancer spread and you will be informed of the results.   Preparing for your Surgery   Plan for surgery on February 24, 2022 with Dr. Bernadene Bell at [facility]. You will be scheduled for pelvic examination under anesthesia, cold knife conization of the cervix (cone shaped biopsy of the cervix), endocervical curettage (sampling from the inner canal of the cervix).    Pre-operative Testing -You will receive a phone call from presurgical testing at [facility] to discuss surgery instructions and arrange for lab work if needed.   -Bring your insurance card, copy of an advanced directive if applicable, medication list.   -You should not be taking blood thinners or aspirin at least ten days prior to surgery unless instructed by your surgeon.   -Do not take supplements such as fish oil (omega 3), red yeast rice, turmeric before your surgery. You want to avoid medications with aspirin in them including headache powders such as BC or Goody's), Excedrin migraine.   Day Before Surgery at [place] will be advised you can have clear liquids up until 3 hours before your surgery.     Your role in recovery Your role is to become active as soon as directed by your doctor, while still giving yourself time to heal.  Rest when you feel tired. You will be asked to do the following in order to speed your recovery:   - Cough and breathe deeply. This helps to clear and expand your lungs and can prevent pneumonia after surgery.  - [place]. Do mild physical activity. Walking or moving your legs help your circulation and body functions return to normal. Do not try to get up or walk alone the first time after surgery.   -If you develop swelling on one leg or the other, pain in the back of your leg, redness/warmth in one of your legs, please call the office or go to the Emergency Room  to have a doppler to rule out a blood clot. For shortness of breath, chest pain-seek care in the Emergency Room as soon as possible. - Actively manage your pain. Managing your pain lets you move in comfort. We will ask you to rate your pain on a scale of zero to 10. It is your responsibility to tell your doctor or nurse where and how much you hurt so your pain can be treated.   Special Considerations -Your final pathology results from surgery should be available around one week after surgery and the results will be relayed to you when available.   -FMLA forms can be faxed to [phone] and please allow 5-7 business days for completion.   Pain Management After Surgery -You will be prescribed your pain medication and bowel regimen medications before surgery so that you can have these available when you are discharged from the hospital. The pain medication is for use ONLY AFTER surgery and a new prescription will not be given.    -Make sure that you have Tylenol and Ibuprofen at home IF [place] to use on a regular basis after surgery for pain control. We recommend alternating the medications every hour to six hours since they work differently and are processed in the body differently for pain relief.   -Review the attached handout on narcotic use and their risks and side effects.    Bowel  Regimen -You will be prescribed Sennakot-S to take nightly to prevent constipation especially if you are taking the narcotic pain medication intermittently.  It is important to prevent constipation and drink adequate amounts of liquids. You can stop taking this medication when you are not taking pain medication and you are back on your normal bowel routine.   Risks of Surgery Risks of surgery are low but include bleeding, infection, damage to surrounding structures, re-operation, blood clots, and very rarely death.   AFTER SURGERY INSTRUCTIONS   Return to work:  variable based on  occupation, 2-3 days   Activity: 1. Be up and out of the bed during the day.  Take a nap if needed.  You may walk up steps but be careful and use the hand rail.  Stair climbing will tire you more than you think, you may need to stop part way and rest.    2. No lifting or straining for 2 weeks over 10 pounds minimum. No pushing, pulling, straining for 2 weeks.   3. No driving for minimum 24 hours after surgery.  Do not drive if you are taking narcotic pain medicine and make sure that your reaction time has returned.    4. You can shower as soon as the next day after surgery. Shower daily. No tub baths or submerging your body in water until cleared by your surgeon. If you have the soap that was given to you by pre-surgical testing that was used before surgery, you do not need to use it afterwards because this can irritate your incisions.    5. No sexual activity and nothing in the vagina for 4 weeks (until seen in the office).   6. You may experience vaginal spotting and discharge after surgery.  The spotting is normal but if you experience heavy bleeding, call our office.   7. Take Tylenol or ibuprofen first for pain if you are able to take these medications and only use narcotic pain medication for severe pain not relieved by the Tylenol or Ibuprofen.  Monitor your Tylenol intake to a max of 4,000 mg in a 24 hour period. You can alternate these medications after surgery.   Diet: 1. Low sodium Heart Healthy Diet is recommended but you are cleared to resume your normal (before surgery) diet after your procedure.   2. It is safe to use a laxative, such as Miralax or Colace, if you have difficulty moving your bowels. You have been prescribed Sennakot at bedtime every evening to keep bowel movements regular and to prevent constipation.     Wound Care: 1. Keep clean and dry.  Shower daily.   Reasons to call the Doctor: Fever - Oral temperature greater than 100.4 degrees Fahrenheit Foul-smelling  vaginal discharge Difficulty urinating Nausea and vomiting Increased pain at the site of the incision that is unrelieved with pain medicine. Difficulty breathing with or without chest pain New calf pain especially if only on one side Sudden, continuing increased vaginal bleeding with or without clots.   Contacts: For questions or concerns you should contact:   Dr. Bernadene Bell at [place], NP at [phone]   After Hours: call [phone] and have the GYN Oncologist paged/contacted (after 5 pm or on the weekends).   Messages sent via mychart are for non-urgent matters and are not responded to after hours so for urgent needs, please call the after hours number.

## 2022-02-12 NOTE — Progress Notes (Signed)
Patient here for new patient consultation with Dr. Bernadene Bell and for a pre-operative appointment prior to her scheduled surgery on February 24, 2022. She is scheduled for pelvic examination under anesthesia, cold knife conization of the cervix, endocervical curettage. The surgery was discussed in detail.  See after visit summary for additional details.    Discussed post-op pain management in detail including the aspects of the enhanced recovery pathway.  Advised her that a new prescription would be sent in for tramadol and it is only to be used for after her upcoming surgery.  We discussed the use of tylenol post-op and to monitor for a maximum of 4,000 mg in a 24 hour period.  Also prescribed sennakot to be used after surgery and to hold if having loose stools.  Discussed bowel regimen in detail.     Discussed measures to take at home to prevent DVT including frequent mobility.  Reportable signs and symptoms of DVT discussed. Post-operative instructions discussed and expectations for after surgery. Incisional care discussed as well including reportable signs and symptoms including erythema, drainage, wound separation.     10 minutes spent with the patient and preparing information.  Verbalizing understanding of material discussed. No needs or concerns voiced at the end of the visit.   Advised patient and family to call for any needs.  Advised that her post-operative medications had been prescribed and could be picked up at any time.    This appointment is included in the global surgical bundle as pre-operative teaching and has no charge.

## 2022-02-12 NOTE — Telephone Encounter (Signed)
Pt LOV was on 07/14/21 Last refill done on 01/07/22 Please advise- pt pharmacy has been updated

## 2022-02-16 ENCOUNTER — Telehealth: Payer: Self-pay

## 2022-02-16 ENCOUNTER — Other Ambulatory Visit (HOSPITAL_COMMUNITY): Payer: Self-pay

## 2022-02-16 NOTE — Telephone Encounter (Signed)
Noted  

## 2022-02-16 NOTE — Telephone Encounter (Signed)
Patient Advocate Encounter   Received notification from [place] that prior authorization for Amkphetamine-Dextroamphetamine '30MG'$  tabs is required.   PA submitted on 02/16/2022 Key  [id number] Status is pending       Joneen Boers, [place] Patient Advocate Specialist [place] Patient Advocate Team Direct Number: [phone] Fax: [phone]

## 2022-02-18 ENCOUNTER — Inpatient Hospital Stay: Payer: 59 | Admitting: Licensed Clinical Social Worker

## 2022-02-18 NOTE — Progress Notes (Signed)
[place] Work  Initial Assessment   Alison Cervantes is a 41 y.o. year old female contacted by phone. Clinical Social Work was referred by new patient protocol for assessment of psychosocial needs.   SDOH (Social Determinants of Health) assessments performed: Yes SDOH Interventions    Flowsheet Row Clinical Support from 02/18/2022 in [facility]  SDOH Interventions   Housing Interventions Intervention Not Indicated  Transportation Interventions Intervention Not Indicated       [place]: Low Risk  (02/18/2022)  Transportation Needs: No Transportation Needs (02/18/2022)  Depression (PHQ2-9): Medium Risk (07/14/2021)  Tobacco Use: Low Risk  (02/10/2022)     Distress Screen completed: No     No data to display            Family/Social Information:  Housing Arrangement: patient lives with boyfriend Family members/support persons in your life? 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  Income source: Employment Financial concerns:  Possibly depending on costs after insurance Type of concern: Medical bills Food access concerns: no Religious or spiritual practice: Not known Services Currently in place:    Coping/ Adjustment to diagnosis: Patient understands treatment plan and what happens next? yes, has surgery next week. Being able to focus on her work and other things has been helpful for pt Concerns about diagnosis and/or treatment:  potentially costs of treatment Patient reported stressors: Adjusting to my illness Current coping skills/ strengths: Capable of independent living , Armed forces logistics/support/administrative officer , Motivation for treatment/growth , and Supportive family/friends     SUMMARY: Current SDOH Barriers:  Potentially financial depending on medical costs  Clinical Social Work Clinical Goal(s):  Patient  will follow up with cancer foundations* as directed by SW  Interventions: Discussed common feeling and emotions when being diagnosed with cancer, and the importance of support during treatment Informed patient of the support team roles and support services at [facility] Provided [place] contact information and encouraged patient to call with any questions or concerns Provided patient with information about CancerCare and [facility]   Follow Up Plan: Patient will look into cancer foundations for assistance and contact CSW with any support questions or needs Patient verbalizes understanding of plan: Yes    Shatonia Hoots E Shaneya Taketa, LCSW

## 2022-02-19 ENCOUNTER — Other Ambulatory Visit (HOSPITAL_COMMUNITY): Payer: Self-pay

## 2022-02-19 NOTE — Telephone Encounter (Signed)
Even though Prior Josem Kaufmann has been approved, insurance still has plan limitation and will only cover 1 tablet a day.  Pharmacy Patient Advocate Encounter  Prior Authorization for Amphetamine Dextroamphetamine 30 MG has been approved.

## 2022-02-20 ENCOUNTER — Encounter (HOSPITAL_COMMUNITY): Payer: 59

## 2022-02-20 ENCOUNTER — Telehealth: Payer: Self-pay | Admitting: Gynecologic Oncology

## 2022-02-20 ENCOUNTER — Encounter (HOSPITAL_BASED_OUTPATIENT_CLINIC_OR_DEPARTMENT_OTHER): Payer: Self-pay | Admitting: Psychiatry

## 2022-02-20 NOTE — Telephone Encounter (Signed)
Spoke with the patient about PET and her insurance. The PET has been denied twice. Insurance rep states part of denial reason includes patient not having standard imaging like CT and has to be Stage IB1 or greater.   Patient advised to move forward with [facility], [facility] as scheduled for Tuesday with Dr. Ernestina Patches. Imaging will be based on results. Verbalizing understanding.

## 2022-02-20 NOTE — Telephone Encounter (Signed)
  FYI Spoke with pt advised that her insurance will not approve for pt to take  Adderall twice daily, pt state that she does not want to stop taking medication twice daily. Pt is ok paying for Rx out of pocket.

## 2022-02-20 NOTE — Telephone Encounter (Signed)
Please call the patient and ask her if once a day would be okay

## 2022-02-20 NOTE — Progress Notes (Signed)
Spoke w/ via phone for pre-op interview--- pt Lab needs dos----  urine preg              Lab results------ no COVID test -----patient states asymptomatic no test needed Arrive at ------- 1215 on 02-24-2022 NPO after MN NO Solid Food.  Clear liquids from MN until--- 1115 Med rec completed Medications to take morning of surgery ----- none Diabetic medication ----- n/a Patient instructed no nail polish to be worn day of surgery Patient instructed to bring photo id and insurance card day of surgery Patient aware to have Driver (ride ) / caregiver    for 24 hours after surgery -- sig other, chris Patient Special Instructions ----- n/a Pre-Op special Istructions ----- n/a Patient verbalized understanding of instructions that were given at this phone interview. Patient denies shortness of breath, chest pain, fever, cough at this phone interview.

## 2022-02-24 ENCOUNTER — Encounter (HOSPITAL_BASED_OUTPATIENT_CLINIC_OR_DEPARTMENT_OTHER)
Admission: RE | Disposition: A | Discharge status: Home / Self Care | Payer: Self-pay | Source: Ambulatory Visit | Attending: Psychiatry

## 2022-02-24 ENCOUNTER — Ambulatory Visit (HOSPITAL_BASED_OUTPATIENT_CLINIC_OR_DEPARTMENT_OTHER)
Admission: RE | Admit: 2022-02-24 | Discharge: 2022-02-24 | Disposition: A | Discharge status: Home / Self Care | Payer: 59 | Source: Ambulatory Visit | Attending: Psychiatry | Admitting: Psychiatry

## 2022-02-24 ENCOUNTER — Ambulatory Visit (HOSPITAL_BASED_OUTPATIENT_CLINIC_OR_DEPARTMENT_OTHER): Payer: 59 | Admitting: Anesthesiology

## 2022-02-24 ENCOUNTER — Other Ambulatory Visit: Payer: Self-pay

## 2022-02-24 ENCOUNTER — Encounter (HOSPITAL_BASED_OUTPATIENT_CLINIC_OR_DEPARTMENT_OTHER): Payer: Self-pay | Admitting: Psychiatry

## 2022-02-24 DIAGNOSIS — Z8711 Personal history of peptic ulcer disease: Secondary | ICD-10-CM | POA: Insufficient documentation

## 2022-02-24 DIAGNOSIS — C539 Malignant neoplasm of cervix uteri, unspecified: Secondary | ICD-10-CM | POA: Diagnosis not present

## 2022-02-24 DIAGNOSIS — C530 Malignant neoplasm of endocervix: Secondary | ICD-10-CM | POA: Diagnosis present

## 2022-02-24 DIAGNOSIS — C531 Malignant neoplasm of exocervix: Secondary | ICD-10-CM | POA: Diagnosis not present

## 2022-02-24 DIAGNOSIS — Z01818 Encounter for other preprocedural examination: Secondary | ICD-10-CM

## 2022-02-24 HISTORY — PX: CERVICAL CONIZATION W/BX: SHX1330

## 2022-02-24 HISTORY — DX: Psoriasis, unspecified: L40.9

## 2022-02-24 HISTORY — DX: Personal history of other diseases of the nervous system and sense organs: Z86.69

## 2022-02-24 HISTORY — DX: Presence of spectacles and contact lenses: Z97.3

## 2022-02-24 HISTORY — DX: Malignant neoplasm of cervix uteri, unspecified: C53.9

## 2022-02-24 HISTORY — DX: Personal history of peptic ulcer disease: Z87.11

## 2022-02-24 HISTORY — DX: Personal history of diseases of the blood and blood-forming organs and certain disorders involving the immune mechanism: Z86.2

## 2022-02-24 HISTORY — DX: Vitamin D deficiency, unspecified: E55.9

## 2022-02-24 LAB — POCT PREGNANCY, URINE: Preg Test, Ur: NEGATIVE

## 2022-02-24 SURGERY — EXAM UNDER ANESTHESIA
Anesthesia: General | Site: Vagina

## 2022-02-24 MED ORDER — MIDAZOLAM HCL 5 MG/5ML IJ SOLN
INTRAMUSCULAR | Status: DC | PRN
  Administered 2022-02-24: 2 mg via INTRAVENOUS

## 2022-02-24 MED ORDER — PHENYLEPHRINE HCL (PRESSORS) 10 MG/ML IV SOLN
INTRAVENOUS | Status: DC | PRN
  Administered 2022-02-24 (×3): 80 ug via INTRAVENOUS

## 2022-02-24 MED ORDER — PROMETHAZINE HCL 25 MG/ML IJ SOLN: 6.2500 mg | INTRAMUSCULAR | Status: DC | PRN

## 2022-02-24 MED ORDER — GLYCOPYRROLATE 0.2 MG/ML IJ SOLN
INTRAMUSCULAR | Status: DC | PRN
  Administered 2022-02-24: .2 mg via INTRAVENOUS

## 2022-02-24 MED ORDER — LIDOCAINE HCL (PF) 2 % IJ SOLN
INTRAMUSCULAR | Status: AC
  Filled 2022-02-24: qty 5

## 2022-02-24 MED ORDER — 0.9 % SODIUM CHLORIDE (POUR BTL) OPTIME
TOPICAL | Status: DC | PRN
  Administered 2022-02-24: 500 mL

## 2022-02-24 MED ORDER — FERRIC SUBSULFATE (BULK) SOLN
Status: DC | PRN
  Administered 2022-02-24: 1

## 2022-02-24 MED ORDER — ACETAMINOPHEN 500 MG PO TABS
1000.0000 mg | ORAL_TABLET | ORAL | Status: AC
  Administered 2022-02-24: 1000 mg via ORAL

## 2022-02-24 MED ORDER — KETOROLAC TROMETHAMINE 15 MG/ML IJ SOLN: 15.0000 mg | INTRAMUSCULAR | Status: DC

## 2022-02-24 MED ORDER — LIDOCAINE HCL (CARDIAC) PF 100 MG/5ML IV SOSY
PREFILLED_SYRINGE | INTRAVENOUS | Status: DC | PRN
  Administered 2022-02-24: 100 mg via INTRAVENOUS

## 2022-02-24 MED ORDER — SCOPOLAMINE 1 MG/3DAYS TD PT72
1.0000 | MEDICATED_PATCH | TRANSDERMAL | Status: DC
  Administered 2022-02-24: 1.5 mg via TRANSDERMAL

## 2022-02-24 MED ORDER — MIDAZOLAM HCL 2 MG/2ML IJ SOLN
INTRAMUSCULAR | Status: AC
  Filled 2022-02-24: qty 2

## 2022-02-24 MED ORDER — PROPOFOL 10 MG/ML IV BOLUS
INTRAVENOUS | Status: DC | PRN
  Administered 2022-02-24: 160 mg via INTRAVENOUS

## 2022-02-24 MED ORDER — KETOROLAC TROMETHAMINE 30 MG/ML IJ SOLN
INTRAMUSCULAR | Status: DC | PRN
  Administered 2022-02-24: 30 mg via INTRAVENOUS

## 2022-02-24 MED ORDER — EPHEDRINE SULFATE (PRESSORS) 50 MG/ML IJ SOLN
INTRAMUSCULAR | Status: DC | PRN
  Administered 2022-02-24 (×2): 10 mg via INTRAVENOUS
  Administered 2022-02-24: 5 mg via INTRAVENOUS

## 2022-02-24 MED ORDER — DEXAMETHASONE SODIUM PHOSPHATE 4 MG/ML IJ SOLN
INTRAMUSCULAR | Status: DC | PRN
  Administered 2022-02-24: 5 mg via INTRAVENOUS

## 2022-02-24 MED ORDER — PHENYLEPHRINE 80 MCG/ML (10ML) SYRINGE FOR IV PUSH (FOR BLOOD PRESSURE SUPPORT)
PREFILLED_SYRINGE | INTRAVENOUS | Status: AC
  Filled 2022-02-24: qty 10

## 2022-02-24 MED ORDER — EPHEDRINE 5 MG/ML INJ
INTRAVENOUS | Status: AC
  Filled 2022-02-24: qty 5

## 2022-02-24 MED ORDER — AMISULPRIDE (ANTIEMETIC) 5 MG/2ML IV SOLN: 10.0000 mg | Freq: Once | INTRAVENOUS | Status: DC | PRN

## 2022-02-24 MED ORDER — PROPOFOL 10 MG/ML IV BOLUS
INTRAVENOUS | Status: AC
  Filled 2022-02-24: qty 20

## 2022-02-24 MED ORDER — ONDANSETRON HCL 4 MG/2ML IJ SOLN
INTRAMUSCULAR | Status: DC | PRN
  Administered 2022-02-24: 4 mg via INTRAVENOUS

## 2022-02-24 MED ORDER — FENTANYL CITRATE (PF) 100 MCG/2ML IJ SOLN: 25.0000 ug | INTRAMUSCULAR | Status: DC | PRN

## 2022-02-24 MED ORDER — FENTANYL CITRATE (PF) 100 MCG/2ML IJ SOLN
INTRAMUSCULAR | Status: AC
  Filled 2022-02-24: qty 2

## 2022-02-24 MED ORDER — ACETAMINOPHEN 500 MG PO TABS
ORAL_TABLET | ORAL | Status: AC
  Filled 2022-02-24: qty 2

## 2022-02-24 MED ORDER — DEXAMETHASONE SODIUM PHOSPHATE 10 MG/ML IJ SOLN
INTRAMUSCULAR | Status: AC
  Filled 2022-02-24: qty 1

## 2022-02-24 MED ORDER — GLYCOPYRROLATE PF 0.2 MG/ML IJ SOSY
PREFILLED_SYRINGE | INTRAMUSCULAR | Status: AC
  Filled 2022-02-24: qty 1

## 2022-02-24 MED ORDER — ACETIC ACID 5 % SOLN
Status: DC | PRN
  Administered 2022-02-24: 1 via TOPICAL

## 2022-02-24 MED ORDER — LACTATED RINGERS IV SOLN: INTRAVENOUS | Status: DC

## 2022-02-24 MED ORDER — FENTANYL CITRATE (PF) 100 MCG/2ML IJ SOLN
INTRAMUSCULAR | Status: DC | PRN
  Administered 2022-02-24: 100 ug via INTRAVENOUS

## 2022-02-24 MED ORDER — HEMOSTATIC AGENTS (NO CHARGE) OPTIME
TOPICAL | Status: DC | PRN
  Administered 2022-02-24: 1

## 2022-02-24 MED ORDER — SCOPOLAMINE 1 MG/3DAYS TD PT72
MEDICATED_PATCH | TRANSDERMAL | Status: AC
  Filled 2022-02-24: qty 1

## 2022-02-24 MED ORDER — KETOROLAC TROMETHAMINE 30 MG/ML IJ SOLN
INTRAMUSCULAR | Status: AC
  Filled 2022-02-24: qty 1

## 2022-02-24 MED ORDER — DEXAMETHASONE SODIUM PHOSPHATE 10 MG/ML IJ SOLN: 4.0000 mg | INTRAMUSCULAR | Status: DC

## 2022-02-24 MED ORDER — LIDOCAINE HCL 1 % IJ SOLN
INTRAMUSCULAR | Status: DC | PRN
  Administered 2022-02-24: 10 mL

## 2022-02-24 MED ORDER — ONDANSETRON HCL 4 MG/2ML IJ SOLN
INTRAMUSCULAR | Status: AC
  Filled 2022-02-24: qty 2

## 2022-02-24 SURGICAL SUPPLY — 31 items
APL SWBSTK 6 STRL LF DISP (MISCELLANEOUS) ×1
APPLICATOR COTTON TIP 6 STRL (MISCELLANEOUS) ×1 IMPLANT
APPLICATOR COTTON TIP 6IN STRL (MISCELLANEOUS) ×1 IMPLANT
BLADE EXTENDED COATED 6.5IN (ELECTRODE) ×1 IMPLANT
BLADE SURG 11 STRL SS (BLADE) ×1 IMPLANT
BLADE SURG 15 STRL LF DISP TIS (BLADE) IMPLANT
BLADE SURG 15 STRL SS (BLADE)
BLADE SURG SZ11 CARB STEEL (BLADE) IMPLANT
CATH ROBINSON RED A/P 16FR (CATHETERS) IMPLANT
DRSG TELFA 3X8 NADH STRL (GAUZE/BANDAGES/DRESSINGS) ×1 IMPLANT
ELECT BALL LEEP 5MM RED (ELECTRODE) IMPLANT
GLOVE BIO SURGEON STRL SZ 6 (GLOVE) ×2 IMPLANT
GLOVE BIOGEL PI IND STRL 6.5 (GLOVE) IMPLANT
GLOVE SURG SS PI 7.5 STRL IVOR (GLOVE) IMPLANT
GOWN STRL REUS W/TWL LRG LVL3 (GOWN DISPOSABLE) ×1 IMPLANT
HEMOSTAT SURGICEL 4X8 (HEMOSTASIS) ×1 IMPLANT
KIT TURNOVER CYSTO (KITS) ×1 IMPLANT
NDL HYPO 25X1 1.5 SAFETY (NEEDLE) IMPLANT
NEEDLE HYPO 25X1 1.5 SAFETY (NEEDLE) IMPLANT
NS IRRIG 500ML POUR BTL (IV SOLUTION) ×1 IMPLANT
PACK PERINEAL COLD (PAD) ×1 IMPLANT
PACK VAGINAL WOMENS (CUSTOM PROCEDURE TRAY) ×1 IMPLANT
PAD PREP 24X48 CUFFED NSTRL (MISCELLANEOUS) ×1 IMPLANT
SPONGE SURGIFOAM ABS GEL 12-7 (HEMOSTASIS) IMPLANT
SUT VIC AB 0 CT1 27 (SUTURE) ×1
SUT VIC AB 0 CT1 27XBRD ANBCTR (SUTURE) IMPLANT
SUT VIC AB 0 CT1 36 (SUTURE) ×2 IMPLANT
SWAB OB GYN 8IN STERILE 2PK (MISCELLANEOUS) ×1 IMPLANT
SYR BULB EAR ULCER 3OZ GRN STR (SYRINGE) IMPLANT
TOWEL OR 17X26 10 PK STRL BLUE (TOWEL DISPOSABLE) ×2 IMPLANT
TUBE CONNECTING 12X1/4 (SUCTIONS) ×1 IMPLANT

## 2022-02-24 NOTE — Interval H&P Note (Signed)
History and Physical Interval Note:  02/24/2022 1:30 PM  Alison Cervantes  has presented today for surgery, with the diagnosis of CERVICAL CANCER.  The various methods of treatment have been discussed with the patient and family. After consideration of risks, benefits and other options for treatment, the patient has consented to  Procedure(s): EXAM UNDER ANESTHESIA; ENDOCERVICAL CURRETAGE (N/A) CONIZATION CERVIX WITH BIOPSY (N/A) as a surgical intervention.  The patient's history has been reviewed, patient examined, no change in status, stable for surgery.  I have reviewed the patient's chart and labs.  Questions were answered to the patient's satisfaction.     Micai Apolinar

## 2022-02-24 NOTE — Anesthesia Preprocedure Evaluation (Addendum)
Anesthesia Evaluation  Patient identified by MRN, date of birth, ID band Patient awake    Reviewed: Allergy & Precautions, NPO status , Patient's Chart, lab work & pertinent test results  History of Anesthesia Complications Negative for: history of anesthetic complications  Airway Mallampati: II  TM Distance: >3 FB Neck ROM: Full    Dental no notable dental hx. (+) Dental Advisory Given   Pulmonary neg pulmonary ROS   Pulmonary exam normal        Cardiovascular negative cardio ROS Normal cardiovascular exam     Neuro/Psych  PSYCHIATRIC DISORDERS Anxiety Depression    negative neurological ROS     GI/Hepatic PUD,,,(+)     substance abuse  alcohol use  Endo/Other  negative endocrine ROS    Renal/GU negative Renal ROS     Musculoskeletal negative musculoskeletal ROS (+)    Abdominal   Peds  Hematology negative hematology ROS (+)   Anesthesia Other Findings   Reproductive/Obstetrics                             Anesthesia Physical Anesthesia Plan  ASA: 2  Anesthesia Plan: General   Post-op Pain Management: Tylenol PO (pre-op)* and Toradol IV (intra-op)*   Induction: Intravenous  PONV Risk Score and Plan: 4 or greater and Ondansetron, Dexamethasone, Midazolam and Scopolamine patch - Pre-op  Airway Management Planned: LMA  Additional Equipment:   Intra-op Plan:   Post-operative Plan: Extubation in OR  Informed Consent: I have reviewed the patients History and Physical, chart, labs and discussed the procedure including the risks, benefits and alternatives for the proposed anesthesia with the patient or authorized representative who has indicated his/her understanding and acceptance.     Dental advisory given  Plan Discussed with: Anesthesiologist and CRNA  Anesthesia Plan Comments:        Anesthesia Quick Evaluation

## 2022-02-24 NOTE — Transfer of Care (Signed)
Immediate Anesthesia Transfer of Care Note  Patient: Alison Cervantes  Procedure(s) Performed: Procedure(s) (LRB): EXAM UNDER ANESTHESIA; ENDOCERVICAL CURRETAGE (N/A) CONIZATION CERVIX WITH BIOPSY (N/A)  Patient Location: PACU  Anesthesia Type: General  Level of Consciousness: awake, sedated, patient cooperative and responds to stimulation  Airway & Oxygen Therapy: Patient Spontanous Breathing and Patient connected to [place] oxygen. Arrived to PACU w/ O/A - removed - pt awake talking to RN   Post-op Assessment: Report given to PACU RN, Post -op Vital signs reviewed and stable and Patient moving all extremities  Post vital signs: Reviewed and stable  Complications: No apparent anesthesia complications

## 2022-02-24 NOTE — Discharge Instructions (Addendum)
02/24/2022  Return to work: 1-2 weeks  Activity: 1. Be up and out of the bed during the day.  Take a nap if needed.  You may walk up steps but be careful and use the hand rail.  Stair climbing will tire you more than you think, you may need to stop part way and rest.   2. No lifting or straining over 15 lbs, pushing, pulling, straining for 2 weeks.  3. Do not drive if you are taking narcotic pain medicine. You need to make sure your reaction time has returned and you can brake safely.  4. Shower daily.  Use your regular soap to bathe and when finished pat your incision dry; don't rub.  No tub baths until cleared by your surgeon.   5. No sexual activity and nothing in the vagina for 6 weeks.  6. You may experience a small amount of clear drainage from your incisions, which is normal.  If the drainage persists or increases, please call the office.  7. You may experience vaginal spotting after surgery or around the 6-8 week mark from surgery when the stitches at the top of the vagina begin to dissolve.  The spotting is normal but if you experience heavy bleeding, call our office.  8. Take Tylenol or ibuprofen first for pain and only use narcotic pain medication for severe pain not relieved by the Tylenol or Ibuprofen.  Monitor your Tylenol intake to a max of 4,000 mg.   Diet: 1. Low sodium Heart Healthy Diet is recommended.  2. It is safe to use a laxative, such as Miralax or Colace, if you have difficulty moving your bowels. You can take Sennakot at bedtime every evening to keep bowel movements regular and to prevent constipation.    Wound Care: 1. Keep clean and dry.  Shower daily.  Reasons to call the Doctor: Fever - Oral temperature greater than 100.4 degrees Fahrenheit Foul-smelling vaginal discharge Difficulty urinating Nausea and vomiting Increased pain at the site of the incision that is unrelieved with pain medicine. Difficulty breathing with or without chest pain New calf  pain especially if only on one side Sudden, continuing increased vaginal bleeding with or without clots.   Contacts: For questions or concerns you should contact:  Dr. Ernestina Patches at [place], [place] at [phone]  After Hours: call [phone] and have the GYN Oncologist paged/contacted      No acetaminophen/Tylenol until after 6:00pm today if needed for pain.    No ibuprofen, Advil, Aleve, Motrin, ketorolac, meloxicam, naproxen, or other NSAIDS until after 8:45pm today if needed for pain.      Post Anesthesia Home Care Instructions  Activity: Get plenty of rest for the remainder of the day. A responsible individual must stay with you for 24 hours following the procedure.  For the next 24 hours, DO NOT: -Drive a car -Paediatric nurse -Drink alcoholic beverages -Take any medication unless instructed by your physician -Make any legal decisions or sign important papers.  Meals: Start with liquid foods such as gelatin or soup. Progress to regular foods as tolerated. Avoid greasy, spicy, heavy foods. If nausea and/or vomiting occur, drink only clear liquids until the nausea and/or vomiting subsides. Call your physician if vomiting continues.  Special Instructions/Symptoms: Your throat may feel dry or sore from the anesthesia or the breathing tube placed in your throat during surgery. If this causes discomfort, gargle with warm salt water. The discomfort should disappear within 24 hours.  If you had a  scopolamine patch placed behind your ear for the management of post- operative nausea and/or vomiting:  1. The medication in the patch is effective for 72 hours, after which it should be removed.  Wrap patch in a tissue and discard in the trash. Wash hands thoroughly with soap and water. 2. You may remove the patch earlier than 72 hours if you experience unpleasant side effects which may include dry mouth, dizziness or visual disturbances. 3. Avoid touching the  patch. Wash your hands with soap and water after contact with the patch.

## 2022-02-24 NOTE — Op Note (Signed)
GYNECOLOGIC ONCOLOGY OPERATIVE NOTE  Date of Service: 02/24/2022  Preoperative Diagnosis: Cervical cancer  Postoperative Diagnosis: Same  Procedures: Cold knife conization, endocervical curettage  Surgeon: Bernadene Bell, MD  Assistants: None  Anesthesia: General  Estimated Blood Loss: 150 mL    Urine Output: voided prior to OR  Findings: On bimanual exam, small area of firmness within endocervical canal posteriorly, otherwise no nodularity or mass. No parametrial nodularity. On speculum exam, glandular friable appearance of external cervical os circumferentially without mass. No IUD strings visualized. IUD incidentally removed in the process of performing endocervical curettage.  Specimens:  ID Type Source Tests Collected by Time Destination  1 : cervical cone (stitch at 12 0'clock) Tissue PATH Gyn biopsy SURGICAL PATHOLOGY Bernadene Bell, MD 02/24/2022 1450   2 : endocervical curettings Tissue PATH Gyn biopsy SURGICAL PATHOLOGY Bernadene Bell, MD [id number]/[id number]     Complications:  None  Indications for Procedure: Alison Cervantes is a 41 y.o. woman with biopsy with invasive squamous cell carcinoma without gross lesion.  Prior to the procedure, all risks, benefits, and alternatives were discussed and informed surgical consent was signed.  Procedure: The patient was taken to the operating room where she was prepped and draped in the normal sterile fashion in the dorsal lithotomy position.  Anesthesia was obtained without difficulty.  A speculum was placed in the vagina. A tenaculum was placed on the anterior lip of the cervix. A cervical block was performed with injecting 5cc of 1% lidocaine each at 3 and 9 o'clock. Two sutures of 0 Vicryl were used to ligate the cervical branches of the cervical artery at the 3 and 9 o'clock positions in a figure-of-eight suture and then tagged. The tenaculum was removed.   Acetic acid was applied to the cervix to visualize the  transformation zone. Using an 11-blade scalpel, cold-knife cone specimen was then obtained in standard fashion.  A stitch was placed at the 12 o'clock position. Next, endocervical curetting was performed of the canal. In the process of performing the curettage, the patient's intrauterine device was removed. Intraoperatively, the patient's partner was notified. Decision was made to proceed without replacing at this time.   The base of the cervical cone specimen was then cauterized using Bovie cautery. Monsel's were then applied. One figure of eight stitch was placed in the posterior cone bed. Surgicel was then placed in the cone bed. The cone site was noted to be hemostatic. The lateral sutures were gently tied over the cervix to secure the surgicel at the cervical bed.    All the instruments were removed from the patient's vagina. Sponge, lap and needle counts were correct x2.   No antibiotics were indicated.  The patient tolerated the procedure well and was transferred to the recovery room in stable condition.   Bernadene Bell, MD Gynecologic Oncology

## 2022-02-24 NOTE — Anesthesia Postprocedure Evaluation (Signed)
Anesthesia Post Note  Patient: Alison Cervantes  Procedure(s) Performed: EXAM UNDER ANESTHESIA; ENDOCERVICAL CURRETAGE (Vagina ) CONIZATION CERVIX WITH BIOPSY (Vagina )     Patient location during evaluation: PACU Anesthesia Type: General Level of consciousness: sedated Pain management: pain level controlled Vital Signs Assessment: post-procedure vital signs reviewed and stable Respiratory status: spontaneous breathing and respiratory function stable Cardiovascular status: stable Postop Assessment: no apparent nausea or vomiting Anesthetic complications: no  No notable events documented.  Last Vitals:  Vitals:   02/24/22 1545 02/24/22 1600  BP: 95/73 111/72  Pulse: 93 83  Resp: 10 15  Temp:  (!) 36.4 C  SpO2: 100% 100%    Last Pain:  Vitals:   02/24/22 1545  TempSrc:   PainSc: 0-No pain                 Acxel Dingee DANIEL

## 2022-02-24 NOTE — Anesthesia Procedure Notes (Signed)
Procedure Name: LMA Insertion Date/Time: 02/24/2022 2:29 PM  Performed by: Justice Rocher, CRNAPre-anesthesia Checklist: Patient identified, Emergency Drugs available, Suction available, Patient being monitored and Timeout performed Patient Re-evaluated:Patient Re-evaluated prior to induction Oxygen Delivery Method: Circle system utilized Preoxygenation: Pre-oxygenation with 100% oxygen Induction Type: IV induction Ventilation: Mask ventilation without difficulty LMA: LMA inserted LMA Size: 4.0 Number of attempts: 1 Airway Equipment and Method: Bite block Placement Confirmation: positive ETCO2, breath sounds checked- equal and bilateral and CO2 detector Tube secured with: Tape Dental Injury: Teeth and Oropharynx as per pre-operative assessment

## 2022-02-25 ENCOUNTER — Encounter (HOSPITAL_COMMUNITY): Payer: Self-pay

## 2022-02-25 ENCOUNTER — Encounter (HOSPITAL_BASED_OUTPATIENT_CLINIC_OR_DEPARTMENT_OTHER): Payer: Self-pay | Admitting: Psychiatry

## 2022-02-25 ENCOUNTER — Telehealth: Payer: Self-pay

## 2022-02-25 ENCOUNTER — Telehealth: Payer: Self-pay | Admitting: *Deleted

## 2022-02-25 ENCOUNTER — Ambulatory Visit (HOSPITAL_COMMUNITY): Payer: 59

## 2022-02-25 LAB — SURGICAL PATHOLOGY

## 2022-02-25 NOTE — Telephone Encounter (Signed)
PET scan authorized and scheduled for 1/15 at 12 pm. Patient given date/time/instructions

## 2022-02-25 NOTE — Telephone Encounter (Signed)
Tried reaching out to pt via phone for post-op call. LVM for her to all office.

## 2022-02-25 NOTE — Telephone Encounter (Signed)
LVM for patient to call office regarding a post-op call since having procedure yesterday.

## 2022-02-26 NOTE — Telephone Encounter (Signed)
Left another message for pt to call office for a post-op follow up call. I will send her a Mychart message

## 2022-02-27 ENCOUNTER — Telehealth: Payer: Self-pay | Admitting: Surgery

## 2022-02-27 NOTE — Telephone Encounter (Signed)
Reached out to patient for post-op call and LVM.

## 2022-03-03 ENCOUNTER — Telehealth: Payer: Self-pay | Admitting: *Deleted

## 2022-03-03 ENCOUNTER — Other Ambulatory Visit: Payer: Self-pay | Admitting: Psychiatry

## 2022-03-03 DIAGNOSIS — C531 Malignant neoplasm of exocervix: Secondary | ICD-10-CM

## 2022-03-03 NOTE — Telephone Encounter (Signed)
Per Dr Ernestina Patches, rescheduled PET from 1/15 to 1/22 along with a MRI scan the same day. LMOM for the patient to calla the office back. Patient needs to be given new date/time/instructions

## 2022-03-04 NOTE — Telephone Encounter (Signed)
Per Dr Ernestina Patches, rescheduled PET from 1/15 to 1/22 along with a MRI scan the same day. LMOM for the patient to call the office back. Patient needs to be given new date/time/instructions

## 2022-03-06 ENCOUNTER — Telehealth: Payer: Self-pay | Admitting: *Deleted

## 2022-03-06 NOTE — Telephone Encounter (Signed)
Patient called and stated "I started having vaginal discharge with an odor a couple days ago. The odor smells like old period mixed with amniotic fluid. The discharge is clear sometimes, but when I wipe it is a blackish color. I am starting to wear a panty liner today. I have no bleeding or N/V. I have some pain but I take breaks and use ibuprofen." Explained that the message would be given to both Dr Ernestina Patches and Lenna Sciara APP.   Patient can come in today if needed and pharmacy verified

## 2022-03-09 NOTE — Telephone Encounter (Signed)
I left another voicemail for patient to call office regarding appointment changes and to follow up from post-op concerns

## 2022-03-10 NOTE — Telephone Encounter (Signed)
Alison Cervantes states that she has not had any fever Or pain since surgery 02-24-22 . The black discharge resolved and is now clear. Reviewed up dated appointments for 03-24-22 at 4 pm for phone visit and post op appointment on 03-30-22 at 1330.

## 2022-03-16 ENCOUNTER — Encounter: Payer: 59 | Admitting: Psychiatry

## 2022-03-16 ENCOUNTER — Other Ambulatory Visit (HOSPITAL_COMMUNITY): Payer: 59

## 2022-03-23 ENCOUNTER — Encounter (HOSPITAL_COMMUNITY)
Admission: RE | Admit: 2022-03-23 | Discharge: 2022-03-23 | Disposition: A | Discharge status: Home / Self Care | Payer: 59 | Source: Ambulatory Visit | Attending: Psychiatry | Admitting: Psychiatry

## 2022-03-23 ENCOUNTER — Ambulatory Visit (HOSPITAL_COMMUNITY)
Admission: RE | Admit: 2022-03-23 | Discharge: 2022-03-23 | Disposition: A | Discharge status: Home / Self Care | Payer: 59 | Source: Ambulatory Visit | Attending: Psychiatry | Admitting: Psychiatry

## 2022-03-23 DIAGNOSIS — C531 Malignant neoplasm of exocervix: Secondary | ICD-10-CM | POA: Insufficient documentation

## 2022-03-23 LAB — GLUCOSE, CAPILLARY: Glucose-Capillary: 97 mg/dL (ref 70–99)

## 2022-03-23 MED ORDER — GADOBUTROL 1 MMOL/ML IV SOLN
6.5000 mL | Freq: Once | INTRAVENOUS | Status: AC | PRN
  Administered 2022-03-23: 6.5 mL via INTRAVENOUS

## 2022-03-23 MED ORDER — FLUDEOXYGLUCOSE F - 18 (FDG) INJECTION
7.3600 | Freq: Once | INTRAVENOUS | Status: AC | PRN
  Administered 2022-03-23: 7.36 via INTRAVENOUS

## 2022-03-24 ENCOUNTER — Inpatient Hospital Stay: Payer: 59 | Attending: Psychiatry | Admitting: Psychiatry

## 2022-03-24 DIAGNOSIS — Z9889 Other specified postprocedural states: Secondary | ICD-10-CM

## 2022-03-24 DIAGNOSIS — C531 Malignant neoplasm of exocervix: Secondary | ICD-10-CM

## 2022-03-24 DIAGNOSIS — Z7189 Other specified counseling: Secondary | ICD-10-CM

## 2022-03-24 NOTE — Progress Notes (Signed)
Gynecologic Oncology Telehealth Follow-Up Note  I connected with Alison Cervantes on 03/24/22 at  4:00 PM EST by telephone and verified that I am speaking with the correct person using two identifiers.  I discussed the limitations, risks, security and privacy concerns of performing an evaluation and management service by telemedicine and the availability of in-person appointments. I also discussed with the patient that there may be a patient responsible charge related to this service. The patient expressed understanding and agreed to proceed.  Other persons participating in the visit and their role in the encounter: None.  Patient's location: Home, [place] Provider's location[facility]  Date of Service: 03/24/2022 Referring Provider: Irene Pap, MD   Assessment & Plan: Alison Cervantes is a 42 y.o. 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  We reviewed the patient's diagnosis of early stage cervical cancer.  Based on cone and imaging her stage is IB2. The patient was counseled extensively on the treatment options for an early cervical cancer which include radical hysterectomy with pelvic lymphadenectomy or chemoradiation.  The risks and benefits of both were reviewed.  I have emphasized that the cure rates for these two treatments are equivalent; however, I discussed that the toxicity profiles are different between these two modalities.    Risks of primary radiotherapy with sensitizing chemotherapy can include cystitis, proctitis, chronic rectal bleeding sigmoid stricture, small bowel obstruction, ureteral stricture, urinary or enteral fistula, loss of sexual function related to vaginal shortening and atrophy and menopause due to ovarian ablation.   Surgery is associated with risks of bleeding, blood transfusion, injury to the bowel, bladder, ureter, urinary fistula, vaginal dehiscence, prolonged or permanent bladder  dysfunction potentially requiring self-catheterization, lymphedema, infection, thromboembolic events, and possible effects on sexual function. She understands that intraoperatively, the finding of previously unsuspected spread outside the cervix may lead to the radical hysterectomy being aborted.  Additionally, high or intermediate-risk factors on the final pathology report, such as positive lymph nodes, deep invasion, large tumor size and lymphatic invasion, will likely lead to a recommendation of postoperative chemotherapy and radiation.   She is leaning toward proceeding with radical hysterectomy. Because the patient is premenopausal, we recommend leaving ovaries in, but bilateral fallopian tubes will be removed. We discussed ovarian transposition at time of procedure. Postoperatively, the patient will have an indwelling foley catheter at the time of discharge. She also understands that additional therapy may be recommended based on the results of her final pathology.  We also reviewed the risks/benefits of considering sentinel lymph node biopsy at time of procedure. Reviewed that this approach has been performed in tumors up to 4cm but may have best efficacy in tumors <2cm. Given that she has concerns regarding risk of lymphedema, discussed that it was reasonable to attempt sentinel lymph node biopsy and if unsuccessful, we would proceed with pelvic lymph node dissection. She is in agreement with this plan.   Pt has follow-up in person next week at which time we will confirm treatment plan and ensure healing of cervix from CKC. Reviewed that we would wait at least 6wks from her cone to proceed with hysterectomy. Have tentatively discussed 04/09/22 at [facility] to perform sentinel lymph node biopsy.   RTC next week.  Bernadene Bell, MD Gynecologic Oncology   Medical Decision Making I personally spent  TOTAL 60 minutes face-to-face and non-face-to-face in the care of this patient, which includes all pre,  intra, and post visit time on  the date of service.  ----------------------- Reason for Visit: Treatment planning  Treatment History: Oncology History  Malignant neoplasm of exocervix ([place])  10/20/2021 Pathology Results   ASCUS, HPV16+   01/27/2022 Procedure   Colposcopy: Cervical biopsy at 5:00: Invasive squamous cell carcinoma, HPV-associated Cervical biopsy 11:00: HSIL Cervical biopsy 12:00 H SIL ECC: HSIL   01/27/2022 Initial Diagnosis   Malignant neoplasm of exocervix ([facility])     Interval History: Pt reports that she is doing better since her CKC procedure. Her pain and discharge have resolved. She denies any bleeding. She is eating and drinking without issue. She is voiding and having regular bowel movements that are helped with sennakot.   Past Medical/Surgical History: Past Medical History:  Diagnosis Date   ADHD (attention deficit hyperactivity disorder)    Cervical cancer ([place])    Depression    Family history of breast cancer    Family history of colon cancer    Family history of melanoma    Family history of ovarian cancer    Family history of prostate cancer    Family history of uterine cancer    History of anemia    History of migraine    History of peptic ulcer disease    Psoriasis    Vitamin D deficiency    Wears glasses     Past Surgical History:  Procedure Laterality Date   CERVICAL CONIZATION W/BX N/A 02/24/2022   Procedure: CONIZATION CERVIX WITH BIOPSY;  Surgeon: Bernadene Bell, MD;  Location: [facility];  Service: Gynecology;  Laterality: N/A;   NO PAST SURGERIES      Family History  Problem Relation Age of Onset   Hyperlipidemia Mother    Hypertension Mother    Melanoma Mother    Alcohol abuse Father    Heart disease Father    Hyperlipidemia Father    Hypertension Father    Diabetes Maternal Grandmother    Heart disease Maternal Grandmother    Hyperlipidemia Maternal Grandmother    Hypertension Maternal Grandmother     Breast cancer Maternal Grandmother    Hyperlipidemia Paternal Grandmother    Hypertension Paternal Grandmother    Leukemia Paternal Grandmother    Brain cancer Paternal Grandmother    Prostate cancer Paternal Grandfather    Diabetes Paternal Grandfather    Hyperlipidemia Paternal Grandfather    Hypertension Paternal Grandfather    Colon cancer Paternal Grandfather    Stomach cancer Paternal Grandfather    Breast cancer Maternal Aunt    Uterine cancer Maternal Aunt    Ovarian cancer Maternal Aunt    Uterine cancer Cousin 73       maternal first cousin   Breast cancer Cousin        maternal cousin with breast cancer in her 1s   Alcohol abuse Other        family hx   Breast cancer Other        1st degree family <50   Coronary artery disease Other        1st degree ><60   Diabetes Other        family hx   Hypertension Other        family hx   Lung cancer Other        family hx    Social History   Socioeconomic History   Marital status: Single    Spouse name: Not on file   Number of children: Not on file   Years of education: Not on file  Highest education level: Not on file  Occupational History   Not on file  Tobacco Use   Smoking status: Never   Smokeless tobacco: Never  Vaping Use   Vaping Use: Never used  Substance and Sexual Activity   Alcohol use: Not Currently   Drug use: Not Currently    Comment: 02-20-2022  per pt smoked marijuana few months ago   Sexual activity: Not on file  Other Topics Concern   Not on file  Social History Narrative   Not on file   Social Determinants of Health   Financial Resource Strain: Not on file  Food Insecurity: Not on file  Transportation Needs: No Transportation Needs (02/18/2022)   PRAPARE - Hydrologist (Medical): No    Lack of Transportation (Non-Medical): No  Physical Activity: Not on file  Stress: Not on file  Social Connections: Not on file    Current Medications:  Current  Outpatient Medications:    [START ON 04/15/2022] amphetamine-dextroamphetamine (ADDERALL) 30 MG tablet, Take 1 tablet by mouth 2 (two) times daily., Disp: 60 tablet, Rfl: 0   Cholecalciferol (VITAMIN D-3 PO), Take 1 capsule by mouth daily., Disp: , Rfl:    hydrocortisone cream 1 %, Apply 1 Application topically 2 (two) times daily as needed for itching., Disp: , Rfl:    ibuprofen (ADVIL) 800 MG tablet, Take 1 tablet (800 mg total) by mouth 2 (two) times daily as needed for moderate pain. For AFTER surgery only (Patient not taking: Reported on 02/20/2022), Disp: 30 tablet, Rfl: 0   levonorgestrel (MIRENA) 20 MCG/24HR IUD, 1 each by Intrauterine route once., Disp: , Rfl:    Omega-3 Fatty Acids (FISH OIL PO), Take 1-2 capsules by mouth daily., Disp: , Rfl:    senna-docusate (SENOKOT-S) 8.6-50 MG tablet, Take 2 tablets by mouth at bedtime. For AFTER surgery, do not take if having diarrhea (Patient not taking: Reported on 02/20/2022), Disp: 30 tablet, Rfl: 0   traMADol (ULTRAM) 50 MG tablet, Take 1 tablet (50 mg total) by mouth every 6 (six) hours as needed for moderate pain. For AFTER surgery only, do not take and drive (Patient not taking: Reported on 02/20/2022), Disp: 5 tablet, Rfl: 0  Review of Symptoms: Pertinent positives as per HPI.  Physical Exam: Deferred given limitations of phone visit.  Laboratory & Radiologic Studies: Surgical pathology (02/24/22): A. CERVIX, CONE:  - Invasive moderately differentiated squamous cell carcinoma associated  with extensive squamous cell carcinoma in situ  - Invasive carcinoma is 2 cm and invades to a depth of 0.6 cm (6 mm)  - Invasive carcinoma involves the ectocervical and deep margins in the  12-3 o'clock quadrant  - Carcinoma in situ involves the endocervical and deep margins in the  6-9 o'clock quadrant  - Focal lymphovascular space involvement  - See oncology table   B. ENDOCERVIX, CURETTAGE:  - Detached fragments of at least squamous cell  carcinoma in situ  - Benign endocervical mucosa   NM PET Image Initial (PI) Skull Base To Thigh (F-18 FDG) 03/23/2022  Addendum 03/23/2022  2:45 PM ADDENDUM REPORT: 03/23/2022 14:43  ADDENDUM: Revised impression: Cervical activity remains suspicious though in light of recent surgery is less specific for residual disease.   Electronically Signed By: Zetta Bills M.D. On: 03/23/2022 14:43  Narrative CLINICAL DATA:  Initial treatment strategy for cervical cancer staging in a 42 year old female.  EXAM: NUCLEAR MEDICINE PET SKULL BASE TO THIGH  TECHNIQUE: 7.36 mCi F-18 FDG was injected  intravenously. Full-ring PET imaging was performed from the skull base to thigh after the radiotracer. CT data was obtained and used for attenuation correction and anatomic localization.  Fasting blood glucose: 96 mg/dl  COMPARISON:  MRI of the pelvis performed on January 22nd of 2024.  FINDINGS: Mediastinal blood pool activity: SUV max 1.69  Liver activity: SUV max NA  NECK: No hypermetabolic lymph nodes in the neck.  Incidental CT findings: None.  CHEST: No hypermetabolic mediastinal or hilar nodes. No suspicious pulmonary nodules on the CT scan.  Incidental CT findings: Signs of brown fat activation and paraspinal regions and in the posterior mediastinum.  ABDOMEN/PELVIS: Area of focal FDG uptake in posterior cervix (image 171/4. Maximum SUV in this location 4.64. Area slightly rounded on FDG portion of the exam but not well delineated on the CT portion of the examination in the RIGHT posterolateral cervical area measuring approximately 1.1 cm. No hypermetabolic lymph nodes in the abdomen or in the pelvis. No pathologic size lymph nodes in the abdomen or in the pelvis.  Incidental CT findings: Liver with smooth contours. No pericholecystic stranding. Pancreas is normal. Spleen normal size and contour. Adrenal glands and kidneys without acute findings. Urinary bladder is mildly  distended without adjacent stranding. No acute gastrointestinal findings.  SKELETON: No focal hypermetabolic activity to suggest skeletal metastasis. Paraspinal brown fat activation as discussed.  Incidental CT findings: None.  IMPRESSION: 1. Focal FDG uptake in the posterior cervix likely the site of reported neoplasm. 2. No signs of distant metastatic disease. 3. Signs of brown fat activation in the paraspinal regions and in the posterior mediastinum.  Electronically Signed: By: Zetta Bills M.D. On: 03/23/2022 13:09   MR Pelvis W Wo Contrast 03/23/2022  Narrative CLINICAL DATA:  Cervical cancer staging in a 42 year old female.  EXAM: MRI PELVIS WITHOUT AND WITH CONTRAST  TECHNIQUE: Multiplanar multisequence MR imaging of the pelvis was performed both before and after administration of intravenous contrast.  CONTRAST:  6.37m GADAVIST GADOBUTROL 1 MMOL/ML IV SOLN  COMPARISON:  None Available.  FINDINGS: Urinary Tract: Urinary bladder only mildly distended. No distal ureteral dilation.  Bowel: Mild distension of the rectum filled with gas which does limit assessment due to susceptibility artifact mildly along the LEFT cervix. No signs of acute bowel abnormality to the extent evaluated on this pelvic MRI.  Vascular/Lymphatic: No adenopathy in the pelvis. Vascular structures are patent.  Reproductive: No discrete lesion is visible in the area of the cervix. Subtle heterogeneity measuring approximately 6 x 10 mm along the RIGHT posterolateral margin of the cervix (image 14/5) also intermediate T2 signal along the anterior cervical os at the lower margin of the cervix on image 14/5 measuring 14 x 7 mm this is at the interface with the collapsed upper vagina and is of uncertain.  No signs of parametrial invasion.  Uterus is grossly normal otherwise as are the ovaries.  Other:  None.  Musculoskeletal: No suspicious bone lesions identified.  IMPRESSION: Some  susceptibility artifact in the area of the cervix may relate to recent surgical change. Mild irregularity at the 6:00 to 9:00 position in the lower cervix and anteriorly along the lower cervical margin without gross enhancing lesion, or signs of parametrial extension.  No adenopathy in the pelvis.  Above findings are limited however along the LEFT cervical margin due to susceptibility artifact from rectal gas. Would suggest the patient return for repeat imaging after restricting diet of large meals, high-fiber and caffeine or carbonated beverages 24 hours prior  to scanning.  After repeat imaging an addendum will be rendered to reflect final impression.   Electronically Signed By: Zetta Bills M.D. On: 03/23/2022 14:41

## 2022-03-25 ENCOUNTER — Telehealth: Payer: Self-pay

## 2022-03-25 NOTE — Telephone Encounter (Signed)
Spoke with Alison Cervantes at [facility] and Ms Sanaai Doane MRN: [id number].

## 2022-03-30 ENCOUNTER — Inpatient Hospital Stay: Payer: 59 | Admitting: Gynecologic Oncology

## 2022-03-30 ENCOUNTER — Other Ambulatory Visit: Payer: Self-pay

## 2022-03-30 ENCOUNTER — Inpatient Hospital Stay (HOSPITAL_BASED_OUTPATIENT_CLINIC_OR_DEPARTMENT_OTHER): Payer: 59 | Admitting: Psychiatry

## 2022-03-30 ENCOUNTER — Telehealth: Payer: Self-pay | Admitting: *Deleted

## 2022-03-30 VITALS — BP 130/88 | HR 90 | Temp 99.0°F | Resp 14 | Wt 145.0 lb

## 2022-03-30 DIAGNOSIS — Z9889 Other specified postprocedural states: Secondary | ICD-10-CM

## 2022-03-30 DIAGNOSIS — C531 Malignant neoplasm of exocervix: Secondary | ICD-10-CM

## 2022-03-30 NOTE — Progress Notes (Unsigned)
Gynecologic Oncology Return Clinic Visit  Date of Service: 03/30/2022 Referring Provider: Irene Pap, MD   Assessment & Plan: Alison Cervantes is a 42 y.o. 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  Postop: - Pt recovering well from surgery and healing appropriately postoperatively - Intraoperative findings and pathology results reviewed. - Ongoing postoperative expectations and precautions reviewed. - Okay to return to work.  Cervical cancer: - Treatment options again reviewed: chemoRT versus radical hysterectomy. - Risks/side effect profiles of each reviewed again. (See note from 03/24/22 for full discussion). - Pt wishes to proceed with radical abdominal hysterectomy. Because the patient is premenopausal, we recommend leaving ovaries in, but bilateral fallopian tubes will be removed.  - We discussed ovarian transposition at time of procedure.  - Postoperatively, the patient will have an indwelling foley catheter at the time of discharge.We will plan for her postop visit and foley removal to occur at [facility].   - She also understands that additional therapy may be recommended based on the results of her final pathology. - We also reviewed the risks/benefits of considering sentinel lymph node biopsy at time of procedure (see note from 03/24/22 for full discussion). Pt wishes to proceed with this approach. - Patient was verbally consented for: pelvic exam under anesthesia, abdominal radical hysterectomy with upper vaginectomy, bilateral sentinel lymph node evaluation and biopsy, possible pelvic lymphadenectomy, ovarian transposition, any other indicated procedure on 04/09/22 at [facility]. - The risks of surgery were discussed in detail and she understands these to including but not limited to bleeding requiring a blood transfusion, infection, injury to adjacent organs (including but not limited to the bowels,  bladder, ureters, nerves, blood vessels), thromboembolic events, wound separation, hernia, vaginal cuff separation, possible risk of lymphedema and lymphocyst if lymphadenectomy performed, unforseen complication, and possible need for re-exploration. - If the patient experiences any of these events, she understands that her hospitalization or recovery may be prolonged and that she may need to take additional medications for a prolonged period. The patient will receive DVT and antibiotic prophylaxis as indicated. She voiced a clear understanding. She had the opportunity to ask questions and verbal informed consent was obtained today. She wishes to proceed. - She will need labs on DOS. - She does not require preoperative clearance. Her METs are >4.  - All preoperative instructions were reviewed. Postoperative expectations were also reviewed.   RTC following surgery.  Bernadene Bell, MD Gynecologic Oncology   Medical Decision Making I personally spent  TOTAL 35 minutes face-to-face and non-face-to-face in the care of this patient, which includes all pre, intra, and post visit time on the date of service.   ----------------------- Reason for Visit: Postop/Treatment discussion  Treatment History: Oncology History  Malignant neoplasm of exocervix ([place])  10/20/2021 Pathology Results   ASCUS, HPV16+   01/27/2022 Procedure   Colposcopy: Cervical biopsy at 5:00: Invasive squamous cell carcinoma, HPV-associated Cervical biopsy 11:00: HSIL Cervical biopsy 12:00 H SIL ECC: HSIL   01/27/2022 Initial Diagnosis   Malignant neoplasm of exocervix ([place])     Interval History: Pt reports that she is recovering well from surgery. She is using no longer needing anything for pain. She is eating and drinking well. She is voiding without issue and having regular bowel movements. Denies any vagina bleeding.  Past Medical/Surgical History: Past Medical History:  Diagnosis Date   ADHD (attention  deficit hyperactivity disorder)    Cervical cancer (  [place])    Depression    Family history of breast cancer    Family history of colon cancer    Family history of melanoma    Family history of ovarian cancer    Family history of prostate cancer    Family history of uterine cancer    History of anemia    History of migraine    History of peptic ulcer disease    Psoriasis    Vitamin D deficiency    Wears glasses     Past Surgical History:  Procedure Laterality Date   CERVICAL CONIZATION W/BX N/A 02/24/2022   Procedure: CONIZATION CERVIX WITH BIOPSY;  Surgeon: Bernadene Bell, MD;  Location: [facility];  Service: Gynecology;  Laterality: N/A;   NO PAST SURGERIES      Family History  Problem Relation Age of Onset   Hyperlipidemia Mother    Hypertension Mother    Melanoma Mother    Alcohol abuse Father    Heart disease Father    Hyperlipidemia Father    Hypertension Father    Diabetes Maternal Grandmother    Heart disease Maternal Grandmother    Hyperlipidemia Maternal Grandmother    Hypertension Maternal Grandmother    Breast cancer Maternal Grandmother    Hyperlipidemia Paternal Grandmother    Hypertension Paternal Grandmother    Leukemia Paternal Grandmother    Brain cancer Paternal Grandmother    Prostate cancer Paternal Grandfather    Diabetes Paternal Grandfather    Hyperlipidemia Paternal Grandfather    Hypertension Paternal Grandfather    Colon cancer Paternal Grandfather    Stomach cancer Paternal Grandfather    Breast cancer Maternal Aunt    Uterine cancer Maternal Aunt    Ovarian cancer Maternal Aunt    Uterine cancer Cousin 88       maternal first cousin   Breast cancer Cousin        maternal cousin with breast cancer in her 86s   Alcohol abuse Other        family hx   Breast cancer Other        1st degree family <50   Coronary artery disease Other        1st degree ><60   Diabetes Other        family hx   Hypertension Other         family hx   Lung cancer Other        family hx    Social History   Socioeconomic History   Marital status: Single    Spouse name: Not on file   Number of children: Not on file   Years of education: Not on file   Highest education level: Not on file  Occupational History   Not on file  Tobacco Use   Smoking status: Never   Smokeless tobacco: Never  Vaping Use   Vaping Use: Never used  Substance and Sexual Activity   Alcohol use: Not Currently   Drug use: Not Currently    Comment: 02-20-2022  per pt smoked marijuana few months ago   Sexual activity: Not on file  Other Topics Concern   Not on file  Social History Narrative   Not on file   Social Determinants of Health   Financial Resource Strain: Not on file  Food Insecurity: Not on file  Transportation Needs: No Transportation Needs (02/18/2022)   PRAPARE - Hydrologist (Medical): No    Lack of Transportation (  Non-Medical): No  Physical Activity: Not on file  Stress: Not on file  Social Connections: Not on file    Current Medications:  Current Outpatient Medications:    [START ON 04/15/2022] amphetamine-dextroamphetamine (ADDERALL) 30 MG tablet, Take 1 tablet by mouth 2 (two) times daily., Disp: 60 tablet, Rfl: 0   Cholecalciferol (VITAMIN D-3 PO), Take 1 capsule by mouth daily., Disp: , Rfl:    hydrocortisone cream 1 %, Apply 1 Application topically 2 (two) times daily as needed for itching., Disp: , Rfl:    ibuprofen (ADVIL) 800 MG tablet, Take 1 tablet (800 mg total) by mouth 2 (two) times daily as needed for moderate pain. For AFTER surgery only (Patient not taking: Reported on 02/20/2022), Disp: 30 tablet, Rfl: 0   levonorgestrel (MIRENA) 20 MCG/24HR IUD, 1 each by Intrauterine route once., Disp: , Rfl:    Omega-3 Fatty Acids (FISH OIL PO), Take 1-2 capsules by mouth daily., Disp: , Rfl:    senna-docusate (SENOKOT-S) 8.6-50 MG tablet, Take 2 tablets by mouth at bedtime. For AFTER  surgery, do not take if having diarrhea (Patient not taking: Reported on 02/20/2022), Disp: 30 tablet, Rfl: 0   traMADol (ULTRAM) 50 MG tablet, Take 1 tablet (50 mg total) by mouth every 6 (six) hours as needed for moderate pain. For AFTER surgery only, do not take and drive (Patient not taking: Reported on 02/20/2022), Disp: 5 tablet, Rfl: 0  Review of Symptoms: Complete 10-system review is negative except as above in Interval History.  Physical Exam: BP 130/88 (BP Location: Left Arm, Patient Position: Sitting)   Pulse 90   Temp 99 F (37.2 C) (Oral)   Resp 14   Wt 145 lb (65.8 kg)   SpO2 100%   BMI 26.52 kg/m  General: Alert, oriented, no acute distress. HEENT: Normocephalic, atraumatic. Neck symmetric without masses. Sclera anicteric.  Chest: Normal work of breathing. Clear to auscultation bilaterally.   Cardiovascular: Regular rate and rhythm, no murmurs. Abdomen: Soft, nontender.  Normoactive bowel sounds.  No masses or hepatosplenomegaly appreciated.   Extremities: Grossly normal range of motion.  Warm, well perfused.  No edema bilaterally. Skin: No rashes or lesions noted. GU: Normal appearing external genitalia without erythema, excoriation, or lesions.  Speculum exam reveals well healing cervix from prior CKC.  Bimanual exam reveals normal cervix on palpation. Small mobile uterus. No adnexal masses. No vaginal nodularity. Exam chaperoned by Joylene John, NP   Laboratory & Radiologic Studies: Surgical pathology (02/24/22): A. CERVIX, CONE:  - Invasive moderately differentiated squamous cell carcinoma associated  with extensive squamous cell carcinoma in situ  - Invasive carcinoma is 2 cm and invades to a depth of 0.6 cm (6 mm)  - Invasive carcinoma involves the ectocervical and deep margins in the  12-3 o'clock quadrant  - Carcinoma in situ involves the endocervical and deep margins in the  6-9 o'clock quadrant  - Focal lymphovascular space involvement  - See oncology  table   B. ENDOCERVIX, CURETTAGE:  - Detached fragments of at least squamous cell carcinoma in situ  - Benign endocervical mucosa    NM PET Image Initial (PI) Skull Base To Thigh (F-18 FDG) 03/23/2022   Addendum 03/23/2022  2:45 PM ADDENDUM REPORT: 03/23/2022 14:43   ADDENDUM: Revised impression: Cervical activity remains suspicious though in light of recent surgery is less specific for residual disease.     Electronically Signed By: Zetta Bills M.D. On: 03/23/2022 14:43   Narrative CLINICAL DATA:  Initial treatment strategy for  cervical cancer staging in a 42 year old female.   EXAM: NUCLEAR MEDICINE PET SKULL BASE TO THIGH   TECHNIQUE: 7.36 mCi F-18 FDG was injected intravenously. Full-ring PET imaging was performed from the skull base to thigh after the radiotracer. CT data was obtained and used for attenuation correction and anatomic localization.   Fasting blood glucose: 96 mg/dl   COMPARISON:  MRI of the pelvis performed on January 22nd of 2024.   FINDINGS: Mediastinal blood pool activity: SUV max 1.69   Liver activity: SUV max NA   NECK: No hypermetabolic lymph nodes in the neck.   Incidental CT findings: None.   CHEST: No hypermetabolic mediastinal or hilar nodes. No suspicious pulmonary nodules on the CT scan.   Incidental CT findings: Signs of brown fat activation and paraspinal regions and in the posterior mediastinum.   ABDOMEN/PELVIS: Area of focal FDG uptake in posterior cervix (image 171/4. Maximum SUV in this location 4.64. Area slightly rounded on FDG portion of the exam but not well delineated on the CT portion of the examination in the RIGHT posterolateral cervical area measuring approximately 1.1 cm. No hypermetabolic lymph nodes in the abdomen or in the pelvis. No pathologic size lymph nodes in the abdomen or in the pelvis.   Incidental CT findings: Liver with smooth contours. No pericholecystic stranding. Pancreas is normal. Spleen  normal size and contour. Adrenal glands and kidneys without acute findings. Urinary bladder is mildly distended without adjacent stranding. No acute gastrointestinal findings.   SKELETON: No focal hypermetabolic activity to suggest skeletal metastasis. Paraspinal brown fat activation as discussed.   Incidental CT findings: None.   IMPRESSION: 1. Focal FDG uptake in the posterior cervix likely the site of reported neoplasm. 2. No signs of distant metastatic disease. 3. Signs of brown fat activation in the paraspinal regions and in the posterior mediastinum.   Electronically Signed: By: Zetta Bills M.D. On: 03/23/2022 13:09     MR Pelvis W Wo Contrast 03/23/2022   Narrative CLINICAL DATA:  Cervical cancer staging in a 42 year old female.   EXAM: MRI PELVIS WITHOUT AND WITH CONTRAST   TECHNIQUE: Multiplanar multisequence MR imaging of the pelvis was performed both before and after administration of intravenous contrast.   CONTRAST:  6.57m GADAVIST GADOBUTROL 1 MMOL/ML IV SOLN   COMPARISON:  None Available.   FINDINGS: Urinary Tract: Urinary bladder only mildly distended. No distal ureteral dilation.   Bowel: Mild distension of the rectum filled with gas which does limit assessment due to susceptibility artifact mildly along the LEFT cervix. No signs of acute bowel abnormality to the extent evaluated on this pelvic MRI.   Vascular/Lymphatic: No adenopathy in the pelvis. Vascular structures are patent.   Reproductive: No discrete lesion is visible in the area of the cervix. Subtle heterogeneity measuring approximately 6 x 10 mm along the RIGHT posterolateral margin of the cervix (image 14/5) also intermediate T2 signal along the anterior cervical os at the lower margin of the cervix on image 14/5 measuring 14 x 7 mm this is at the interface with the collapsed upper vagina and is of uncertain.   No signs of parametrial invasion.   Uterus is grossly normal  otherwise as are the ovaries.   Other:  None.   Musculoskeletal: No suspicious bone lesions identified.   IMPRESSION: Some susceptibility artifact in the area of the cervix may relate to recent surgical change. Mild irregularity at the 6:00 to 9:00 position in the lower cervix and anteriorly along the lower cervical margin  without gross enhancing lesion, or signs of parametrial extension.   No adenopathy in the pelvis.   Above findings are limited however along the LEFT cervical margin due to susceptibility artifact from rectal gas. Would suggest the patient return for repeat imaging after restricting diet of large meals, high-fiber and caffeine or carbonated beverages 24 hours prior to scanning.   After repeat imaging an addendum will be rendered to reflect final impression.     Electronically Signed By: Zetta Bills M.D. On: 03/23/2022 14:41

## 2022-03-30 NOTE — Telephone Encounter (Signed)
Call from [place] at [facility] radiology. She is calling to advise that addendum has been issued to two reports- Pelvic MRI and Nuclear PET scan.  Message sent to Dr Ernestina Patches and Joylene John, NP. Patient has appointment today at 2pm.

## 2022-03-30 NOTE — Patient Instructions (Signed)
Expect a call from [facility] ([facility] on the day of surgery.   No ibuprofen/advil/aleve for 1 week prior to surgery but tylenol is okay.  Nothing solid to eat after midnight the night before surgery but you can have clear liquids up until 1 hour before they schedule you to arrive. At that 1 hour mark, you can drink a small gatorade.

## 2022-04-01 ENCOUNTER — Encounter: Payer: Self-pay | Admitting: Psychiatry

## 2022-04-02 ENCOUNTER — Telehealth: Payer: Self-pay | Admitting: *Deleted

## 2022-04-02 NOTE — Telephone Encounter (Signed)
Call from patient. Returned to work from December surgery on 03-25-22. Needs additional for "fit for duty" note faxed to employer. She will send form via fax and number to fax form to will be included.

## 2022-04-03 NOTE — Telephone Encounter (Signed)
Fit for Duty note faxed to [phone] to patient attention per her request on cover sheet. Confirmation received.

## 2022-04-09 HISTORY — PX: RADICAL HYSTERECTOMY WITH TRANSPOSITION OF OVARIES: SHX6222

## 2022-04-12 ENCOUNTER — Other Ambulatory Visit: Payer: Self-pay | Admitting: Family Medicine

## 2022-04-14 ENCOUNTER — Telehealth: Payer: Self-pay

## 2022-04-14 NOTE — Transitions of Care (Post Inpatient/ED Visit) (Unsigned)
   04/14/2022  Name: Alison Cervantes MRN: [id number] DOB: April 05, 1980  Today's TOC FU Call Status: Today's TOC FU Call Status:: Unsuccessul Call (1st Attempt) Unsuccessful Call (1st Attempt) Date: 04/14/22  Attempted to reach the patient regarding the most recent Inpatient/ED visit.  Follow Up Plan: Additional outreach attempts will be made to reach the patient to complete the Transitions of Care (Post Inpatient/ED visit) call.   [place] LPN [place] Advisor Direct Dial [phone]

## 2022-04-15 NOTE — Transitions of Care (Post Inpatient/ED Visit) (Signed)
   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 How have you been since you were released from the hospital?: Same Any questions or concerns?: No  Items Reviewed: Did you receive and understand the discharge instructions provided?: Yes Medications obtained and verified?: Yes (Medications Reviewed) Any new allergies since your discharge?: No Dietary orders reviewed?: NA Do you have support at home?: Yes  Home Care and Equipment/Supplies: [place] Ordered?: NA Any new equipment or medical supplies ordered?: NA  Functional Questionnaire: Do you need assistance with bathing/showering or dressing?: No Do you need assistance with meal preparation?: No Do you need assistance with eating?: No Do you have difficulty maintaining continence: No Do you need assistance with getting out of bed/getting out of a chair/moving?: No Do you have difficulty managing or taking your medications?: No  Folllow up appointments reviewed: PCP Follow-up appointment confirmed?: NA Specialist Hospital Follow-up appointment confirmed?: Yes Date of Specialist follow-up appointment?: 04/24/22 Follow-Up Specialty Provider:: Dr Ernestina Patches Do you need transportation to your follow-up appointment?: No Do you understand care options if your condition(s) worsen?: Yes-patient verbalized understanding    [place] LPN [place] Direct Dial [phone]

## 2022-04-20 ENCOUNTER — Telehealth: Payer: Self-pay | Admitting: *Deleted

## 2022-04-20 ENCOUNTER — Encounter: Payer: Self-pay | Admitting: Gynecologic Oncology

## 2022-04-20 ENCOUNTER — Encounter: Payer: Self-pay | Admitting: Psychiatry

## 2022-04-20 NOTE — Telephone Encounter (Signed)
Patient scheduled to see Dr Ernestina Patches on 2/26

## 2022-04-21 ENCOUNTER — Encounter: Payer: Self-pay | Admitting: Psychiatry

## 2022-04-22 ENCOUNTER — Other Ambulatory Visit: Payer: Self-pay | Admitting: Psychiatry

## 2022-04-22 MED ORDER — OXYCODONE HCL 5 MG PO TABS: 5.0000 mg | ORAL_TABLET | ORAL | 0 refills | Status: DC | PRN

## 2022-04-27 ENCOUNTER — Encounter: Payer: Self-pay | Admitting: Psychiatry

## 2022-04-27 ENCOUNTER — Inpatient Hospital Stay: Payer: 59 | Attending: Psychiatry | Admitting: Psychiatry

## 2022-04-27 ENCOUNTER — Other Ambulatory Visit: Payer: Self-pay

## 2022-04-27 VITALS — BP 119/71 | HR 98 | Temp 99.3°F | Resp 14 | Ht 62.0 in | Wt 148.0 lb

## 2022-04-27 DIAGNOSIS — C531 Malignant neoplasm of exocervix: Secondary | ICD-10-CM | POA: Insufficient documentation

## 2022-04-27 DIAGNOSIS — Z90710 Acquired absence of both cervix and uterus: Secondary | ICD-10-CM | POA: Insufficient documentation

## 2022-04-27 DIAGNOSIS — Z9079 Acquired absence of other genital organ(s): Secondary | ICD-10-CM | POA: Diagnosis not present

## 2022-04-27 DIAGNOSIS — Z7189 Other specified counseling: Secondary | ICD-10-CM

## 2022-04-27 NOTE — Progress Notes (Signed)
Gynecologic Oncology Return Clinic Visit  Date of Service: 04/27/2022 Referring Provider: Irene Pap, MD    Assessment & Plan: Alison Cervantes is a 42 y.o. 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  Postop: - Pt recovering well from surgery and healing appropriately postoperatively - Intraoperative findings and pathology results reviewed. - Ongoing postoperative expectations and precautions reviewed. Continue with no lifting >10lbs through 6 weeks postoperatively - Incision with small separation but no signs/symptoms concerning for infection.  SCC of cervix: - Reviewed intraoperative findings and pathology results in detail. - Cone specimen with 2cm tumor, 62m DOI, +focal LVSI - Hysterectomy specimen with residual 228mfocus of cancer, 49m76mOI, no LVSI - In discussion with pathologist, the cervical thickness at location of residual invasive carcinoma ranges from 1.1 to 1.8cm (area of prior cone) and the cervical stroma at its thickest is 2cm.  - Reviewed with pt that it can be challenging to combine the data from the cone specimen and the hysterectomy specimen. DOI may be 6mm76m may be 7mm 73mcal residual may not be additive from prior cone specimen). And full cervical stroma is likely somewhere between 18mm 2m20mm. 37meviewed that given 2cm tumor with LVSI, if middle third DOI, would recommend adjuvant radiation for meeting Sedlis criteria. However, it is possible that she is superficial third (possibly 6mm of 249mm). I24mich case, patient still retains a residual risk of recurrence but slightly lower risk than if meets sedlis criteria. - Reviewed observation and adjuvant radiation options. Would recommend pt  have consultation with Dr. Kinard toSondra Comereview further. Most conservative option would be to radiate, but do not feel that it is unreasonable to observe. But pt would require close observation for recurrence.  - Signs/symptoms of recurrence reviewed.   RTC 62mo tenta52moly pending pt's decision regarding radiation.  Dejanee Thibeaux NBernadene Bellologic Oncology   Medical Decision Making I personally spent  TOTAL 34 minutes face-to-face and non-face-to-face in the care of this patient, which includes all pre, intra, and post visit time on the date of service.  2 minutes spent reviewing records prior to the visit 25 Minutes in patient contact 7 minutes charting , conferring with consultants etc.   ----------------------- Reason for Visit: Postop/Treatment counseling  Treatment History: Oncology History  Malignant neoplasm of exocervix (HCC)  8/21[place]23 Pathology Results   ASCUS, HPV16+   01/27/2022 Procedure   Colposcopy: Cervical biopsy at 5:00: Invasive squamous cell carcinoma, HPV-associated Cervical biopsy 11:00: HSIL Cervical biopsy 12:00 H SIL ECC: HSIL   01/27/2022 Initial Diagnosis   Malignant neoplasm of exocervix (HCC)   12/[place]2023 Surgery   [facility]   02/24/2022 Pathology Results   A. CERVIX, CONE:  - Invasive moderately differentiated squamous cell carcinoma associated  with extensive squamous cell carcinoma in situ  - Invasive carcinoma is 2 cm and invades to a depth of 0.6 cm (6 mm)  - Invasive carcinoma involves the ectocervical and deep margins in the  12-3 o'clock quadrant  - Carcinoma in situ involves the endocervical and deep margins in the  6-9 o'clock quadrant  - Focal lymphovascular space involvement  - See oncology table   B. ENDOCERVIX, CURETTAGE:  - Detached fragments of at least squamous cell carcinoma in situ  - Benign endocervical mucosa  03/23/2022 Imaging   PET: IMPRESSION: 1. Focal FDG uptake in the posterior cervix likely the site  of reported neoplasm. 2. No signs of distant metastatic disease. 3. Signs of brown fat activation in the paraspinal regions and in the posterior mediastinum.  ADDENDUM: Revised impression: Cervical activity remains suspicious though in light of recent surgery is less specific for residual disease.   03/23/2022 Imaging   MRI Pelvis: IMPRESSION: Some susceptibility artifact in the area of the cervix may relate to recent surgical change. Mild irregularity at the 6:00 to 9:00 position in the lower cervix and anteriorly along the lower cervical margin without gross enhancing lesion, or signs of parametrial extension.   No adenopathy in the pelvis.   Above findings are limited however along the LEFT cervical margin due to susceptibility artifact from rectal gas. Would suggest the patient return for repeat imaging after restricting diet of large meals, high-fiber and caffeine or carbonated beverages 24 hours prior to scanning.  ADDENDUM: Limitations are as outlined previously. The previously reported "RIGHT posterolateral" abnormality is actually LEFT paramidline posterior cervix, nearly in the midline.   Intermediate T2 signal along the margin near the anterior cervical os is centered more in the midline. Bowel gas limits assessment on both diffusion and postcontrast imaging.     Interval History: Pt reports that she is recovering well from surgery. She is using still using occasional ibuprofen and oxycodone for pain.  She reports that her pain is overall improving however with the most soreness first when she wakes up or at the end of the day after she has been active most of the day.  She is eating and drinking well. She is voiding without issue and having regular bowel movements.  A small area of her incision that appears to open.  They first noticed it on Friday and thought there was some may be yellow discharge on it so they put Neosporin on it and covered it with a gauze.  She  otherwise reports that she felt warm a few days ago but did not take her temperature.  More recently she had some on and off chills but when she took her temperature it was normal.   Past Medical/Surgical History: Past Medical History:  Diagnosis Date   ADHD (attention deficit hyperactivity disorder)    Cervical cancer ([place])    Depression    Family history of breast cancer    Family history of colon cancer    Family history of melanoma    Family history of ovarian cancer    Family history of prostate cancer    Family history of uterine cancer    History of anemia    History of migraine    History of peptic ulcer disease    Psoriasis    Vitamin D deficiency    Wears glasses     Past Surgical History:  Procedure Laterality Date   CERVICAL CONIZATION W/BX N/A 02/24/2022   Procedure: CONIZATION CERVIX WITH BIOPSY;  Surgeon: Bernadene Bell, MD;  Location: [facility];  Service: Gynecology;  Laterality: N/A;   NO PAST SURGERIES      Family History  Problem Relation Age of Onset   Hyperlipidemia Mother    Hypertension Mother    Melanoma Mother    Alcohol abuse Father    Heart disease Father    Hyperlipidemia Father    Hypertension Father    Diabetes Maternal Grandmother    Heart disease Maternal Grandmother    Hyperlipidemia Maternal Grandmother  Hypertension Maternal Grandmother    Breast cancer Maternal Grandmother    Hyperlipidemia Paternal Grandmother    Hypertension Paternal Grandmother    Leukemia Paternal Grandmother    Brain cancer Paternal Grandmother    Prostate cancer Paternal Grandfather    Diabetes Paternal Grandfather    Hyperlipidemia Paternal Grandfather    Hypertension Paternal Grandfather    Colon cancer Paternal Grandfather    Stomach cancer Paternal Grandfather    Breast cancer Maternal Aunt    Uterine cancer Maternal Aunt    Ovarian cancer Maternal Aunt    Uterine cancer Cousin 66       maternal first cousin   Breast cancer  Cousin        maternal cousin with breast cancer in her 27s   Alcohol abuse Other        family hx   Breast cancer Other        1st degree family <50   Coronary artery disease Other        1st degree ><60   Diabetes Other        family hx   Hypertension Other        family hx   Lung cancer Other        family hx    Social History   Socioeconomic History   Marital status: Single    Spouse name: Not on file   Number of children: Not on file   Years of education: Not on file   Highest education level: Not on file  Occupational History   Not on file  Tobacco Use   Smoking status: Never   Smokeless tobacco: Never  Vaping Use   Vaping Use: Never used  Substance and Sexual Activity   Alcohol use: Not Currently   Drug use: Not Currently    Comment: 02-20-2022  per pt smoked marijuana few months ago   Sexual activity: Not on file  Other Topics Concern   Not on file  Social History Narrative   Not on file   Social Determinants of Health   Financial Resource Strain: Not on file  Food Insecurity: Not on file  Transportation Needs: No Transportation Needs (02/18/2022)   PRAPARE - Hydrologist (Medical): No    Lack of Transportation (Non-Medical): No  Physical Activity: Not on file  Stress: Not on file  Social Connections: Not on file    Current Medications:  Current Outpatient Medications:    amphetamine-dextroamphetamine (ADDERALL) 30 MG tablet, Take 1 tablet by mouth 2 (two) times daily., Disp: 60 tablet, Rfl: 0   Cholecalciferol (VITAMIN D-3 PO), Take 1 capsule by mouth daily., Disp: , Rfl:    enoxaparin (LOVENOX) 40 MG/0.4ML injection, Inject 40 mg into the skin daily., Disp: , Rfl:    hydrocortisone cream 1 %, Apply 1 Application topically 2 (two) times daily as needed for itching., Disp: , Rfl:    Omega-3 Fatty Acids (FISH OIL PO), Take 1-2 capsules by mouth daily., Disp: , Rfl:    oxyCODONE (OXY IR/ROXICODONE) 5 MG immediate release  tablet, Take 1 tablet (5 mg total) by mouth every 4 (four) hours as needed for severe pain., Disp: 12 tablet, Rfl: 0   polyethylene glycol powder (GLYCOLAX/MIRALAX) 17 GM/SCOOP powder, Take 1 Container by mouth daily., Disp: , Rfl:    senna-docusate (SENOKOT-S) 8.6-50 MG tablet, Take 2 tablets by mouth at bedtime. For AFTER surgery, do not take if having diarrhea, Disp: 30 tablet, Rfl: 0  traZODone (DESYREL) 50 MG tablet, Take 1 tablet by mouth at bedtime., Disp: , Rfl:   Review of Symptoms: Complete 10-system review is negative except as above in Interval History.  Physical Exam: BP 119/71 (BP Location: Left Arm, Patient Position: Sitting)   Pulse 98   Temp 99.3 F (37.4 C) (Oral)   Resp 14   Ht '5\' 2"'$  (1.575 m)   Wt 148 lb (67.1 kg)   SpO2 99%   BMI 27.07 kg/m  General: Alert, oriented, no acute distress. HEENT: Normocephalic, atraumatic. Neck symmetric without masses. Sclera anicteric.  Chest: Normal work of breathing. Clear to auscultation bilaterally.   Cardiovascular: Regular rate and rhythm, no murmurs. Abdomen: Soft, nontender.  Normoactive bowel sounds.  No masses or hepatosplenomegaly appreciated.  Well-healing incision with Q-tip size superficial opening in the inferior aspect of the wound without purulent drainage, erythema, or induration. Incision probed without additional findings. Extremities: Grossly normal range of motion.  Warm, well perfused.  No edema bilaterally. Skin: No rashes or lesions noted. GU: Normal appearing external genitalia without erythema, excoriation, or lesions.  Speculum exam reveals normal vaginal mucosa and intact vaginal cuff without erythema.  Bimanual exam reveals intact vaginal cuff without induration or fluctuance. Exam chaperoned by Kimberly Martinique, CMA   Laboratory & Radiologic Studies: Diagnosis  A: Lymph node, sentinel, left external iliac, resection - One lymph node with no metastatic carcinoma identified by H&E or pancytokeratin  AE1/AE3 immunohistochemical stain (0/1)   B: Lymph node, sentinel, right obturator, resection - Two lymph nodes with no metastatic carcinoma identified by H&E or pancytokeratin AE1/AE3 immunohistochemical stain (0/2)   C: Lymph node, sentinel, right external iliac, resection - One lymph node with no metastatic carcinoma identified by H&E or pancytokeratin AE1/AE3 immunohistochemical stain (0/1)   D: Uterine artery, left, resection - Muscular artery and fibroadipose tissue with no metastatic carcinoma identified   E: Uterus with cervix, bilateral fallopian tubes, and upper vagina, radical hysterectomy and bilateral salpingectomy - Focal residual invasive squamous cell carcinoma, size 2 mm with a depth of invasion 1 mm in this specimen, involving 12 to 3 o'clock quadrant (consistent with stage pT1b including prior specimen, see comment) - Focal high grade squamous intraepithelial lesion (HSIL, CIN3) also present in 9 to 12 o'clock quadrant - Parametrium, endometrium, myometrium, fallopian tubes, and vaginal cuff with no involvement by carcinoma identified - See synoptic report and comment   F: Anterior vaginal margin, resection - Squamous mucosa and stroma with no carcinoma identified     This electronic signature is attestation that the pathologist personally reviewed the submitted material(s) and the final diagnosis reflects that evaluation.  Electronically signed by Tania Ade, MD on 04/14/2022 at 1027  Diagnosis Comment   Although only focal residual invasive carcinoma with a depth of invasion of 1 mm is present in the current specimen, the prior conization specimen showed depth of invasion of 0.6 cm and horizontal extent 2 cm, consistent with overall stage pT1b. Whether the tumor in the current specimen is in a plane that would be additive to the horizontal extent in the prior specimen cannot be determined; if so the stage could be pT1b2 (>2 cm) or if not the stage could  be pT1b1 (= 2 cm).   Synoptic Report  UTERINE CERVIX: Resection  9th Edition - Protocol posted: 4/5/2023UTERINE CERVIX: RESECTION - All Specimens SPECIMEN  Procedure  Radical hysterectomy    Bilateral salpingectomy    Vaginal cuff resection  TUMOR  Tumor Site  Left  superior (anterior) quadrant (12 to 3 o'clock)  Tumor Size  Greatest Dimension (Centimeters): Current specimen: 0.2 cm  Additional Dimension (Centimeters)  0.1 cm    0.1 cm  Histologic Type  Squamous cell carcinoma, HPV-associated  Histologic Grade  G2, moderately differentiated      Depth of Stromal Invasion  Prior conization specimen reportedly 6 mm; current specimen 1 mm  Extent of Depth of Stromal Invasion  Cannot be determined: Superficial one-third in current specimen, although reported depth of invasion 6 mm in prior conization specimen may have been deeper  Horizontal Extent of Stromal Invasion  Prior conization specimen reportedly 20 mm; current specimen 2 mm  Other Tissue / Organ Involvement  Not identified  Lymphatic and / or Vascular Invasion  Cannot be determined: Not identified in current specimen; reportedly focally present in prior conization specimen  MARGINS  Margin Status for Invasive Carcinoma  All margins negative for invasive carcinoma  Closest Margin(s) to Invasive Carcinoma  Radial / circumferential: 12:00-3:00  Distance from Invasive Carcinoma to Closest Margin  14 mm from radial margin in current specimen  Margin Status for HSIL or AIS  All margins negative for high-grade squamous intraepithelial lesion (HSIL) and / or adenocarcinoma in situ (AIS)  REGIONAL LYMPH NODES      Regional Lymph Node Status  All regional lymph nodes negative for tumor cells  Lymph Nodes Examined    Total Number of Pelvic Nodes Examined  4  Number of Pelvic Sentinel Nodes Examined  4  Total Number of Para-aortic Nodes Examined  0  pTNM CLASSIFICATION (AJCC 9th Version)  Reporting of pT, pN, and (when applicable) pM  categories is based on information available to the pathologist at the time the report is issued. As per the AJCC (Chapter 1, 8th Ed.) it is the managing physician's responsibility to establish the final pathologic stage based upon all pertinent information, including but potentially not limited to this pathology report.  pT Category  pT1b  pN Category  pN0  N Suffix  (sn)(i-)  FIGO STAGE  FIGO Stage (2018 FIGO Cancer Report)  IB  ADDITIONAL FINDINGS  Additional Findings  High-grade squamous intraepithelial lesion (CIN 2 or 3)    Inflammation    Prior surgical site changes, menstrual endometrium, benign myometrium and fallopian tubes, parametrium and vaginal cuff with no involvement by tumor  Comment(s)  See diagnosis comment

## 2022-04-27 NOTE — Patient Instructions (Signed)
It was a pleasure to see you in clinic today. - You are healing well - Return visit planned tentatively planned for 3 mo. We will have you see Dr. Sondra Come with radiation oncology for a consult and go from there.  Thank you very much for allowing me to provide care for you today.  I appreciate your confidence in choosing our Gynecologic Oncology team at [facility].  If you have any questions about your visit today please call our office or send [place] a MyChart message and we will get back to you as soon as possible.

## 2022-04-28 ENCOUNTER — Telehealth: Payer: Self-pay | Admitting: Radiation Oncology

## 2022-04-28 NOTE — Telephone Encounter (Signed)
LVM to schedule CON with Dr. Sondra Come

## 2022-04-29 ENCOUNTER — Telehealth: Payer: Self-pay | Admitting: Radiation Oncology

## 2022-04-29 NOTE — Telephone Encounter (Signed)
LVM to schedule CON with Dr. Sondra Come

## 2022-04-30 ENCOUNTER — Telehealth: Payer: Self-pay | Admitting: Radiation Oncology

## 2022-04-30 NOTE — Telephone Encounter (Signed)
LVM to sched CON with Dr. Sondra Come

## 2022-05-04 ENCOUNTER — Ambulatory Visit
Admission: RE | Admit: 2022-05-04 | Discharge: 2022-05-04 | Disposition: A | Discharge status: Home / Self Care | Payer: 59 | Source: Ambulatory Visit | Attending: Radiation Oncology | Admitting: Radiation Oncology

## 2022-05-04 ENCOUNTER — Telehealth: Payer: Self-pay | Admitting: Radiation Oncology

## 2022-05-04 DIAGNOSIS — C531 Malignant neoplasm of exocervix: Secondary | ICD-10-CM

## 2022-05-04 DIAGNOSIS — C538 Malignant neoplasm of overlapping sites of cervix uteri: Secondary | ICD-10-CM

## 2022-05-04 NOTE — Telephone Encounter (Signed)
LVM on pt's phone to r/s missed consultation today. Called pt's s/o to see if he would be able to reach pt. LVM advising partner of missed appt and c/b number to r/s appt.

## 2022-05-04 NOTE — Progress Notes (Signed)
Rn left message for patient to see if she was coming to her 130 appointment. Rn notified provider and scheduler that the approximant needs to be rescheduled.

## 2022-05-05 NOTE — Progress Notes (Signed)
GYN Location of Tumor / Histology: Cervical   Alison Cervantes presented with symptoms of:  Patient presented for her annual exam and underwent a Pap smear which returned with ASCUS HPV 16 positive.  She then returned for a colpo with cervical biopsies obtained and ECC.  The biopsies resulted with invasive squamous cell carcinoma at 5:00 and H SIL otherwise.   Today patient presents with her spouse.  She reports that she has been experiencing some postcoital bleeding within the last few months.  She also notes increased urinary incontinence and urinary frequency.  She otherwise denies abdominal bloating, early satiety, significant weight loss, change in bowel habits.   Biopsies revealed:  FINAL MICROSCOPIC DIAGNOSIS:  A. CERVIX, CONE: - Invasive moderately differentiated squamous cell carcinoma associated with extensive squamous cell carcinoma in situ - Invasive carcinoma is 2 cm and invades to a depth of 0.6 cm (6 mm) - Invasive carcinoma involves the ectocervical and deep margins in the 12-3 o'clock quadrant - Carcinoma in situ involves the endocervical and deep margins in the 6-9 o'clock quadrant - Focal lymphovascular space involvement - See oncology table  B. ENDOCERVIX, CURETTAGE: - Detached fragments of at least squamous cell carcinoma in situ - Benign endocervical mucosa  ONCOLOGY TABLE: UTERINE CERVIX, CARCINOMA: Resection Procedure: Cervical cone excision and endocervical curettage Tumor Size: 2 cm Histologic Type: Squamous cell carcinoma Histologic Grade: Moderately differentiated Stromal Invasion: Present      Depth of stromal invasion (mm): 6 mm Other Tissue/ Organ: Not applicable Margins:      Margins Involved by Invasive Carcinoma: Ectocervical and deep the 12-3 o'clock quadrant Margin Status for HSIL:      Margins Involved by HSIL: Endocervical and deep in the 6-9 o'clock quadrant, see comment Lymphovascular invasion: Present, focal      Regional Lymph Nodes:  No lymph nodes submitted  Distant Metastasis:      Distant sites involved: Not applicable Pathologic Stage Classification (pTNM, AJCC 8th Edition): pT1B1, pN not assigned Ancillary Studies: Can be performed if requested Representative Tumor Block: A1 Comment(s): Squamous cell carcinoma in situ (HSIL) extends into endocervical glands and focally involves the deep margin and the endocervical margin in the 6-9 o'clock quadrant. (v5.0.1.2)  GROSS DESCRIPTION: A: The specimen is received in formalin and consists of a circular portion of tan-pink soft tissue, measuring 2.7 x 2.3 x 1.5 cm.  Per the requisition a suture is present at the 12:00 aspect.  A thin rim of possible ectocervix is identified, and is tan-red and slightly granular. The opposing surface is tan-pink and roughened.  The ectocervical resection margin is inked black, and the endocervical resection margin is inked yellow.  The specimen is radially sectioned and sequentially submitted in 4 cassettes. 1 = 12-3 o'clock 2 = 3-6 o'clock 3 = 6-9 o'clock 4 = 9-12 o'clock  B: The specimen is received in formalin and consists of a 2.1 x 1.4 x 0.3 cm aggregate of red-brown clotted blood and mucus.  The specimen is entirely submitted in 1 cassette.  Craig Staggers 02/24/2022)  Final Diagnosis performed by Claudette Laws, MD.   Electronically signed 02/25/2022    Past/Anticipated interventions by Gyn/Onc surgery, if any:  Treatment History:      Oncology History  Malignant neoplasm of exocervix ([place])  10/20/2021 Pathology Results    ASCUS, HPV16+    01/27/2022 Procedure    Colposcopy: Cervical biopsy at 5:00: Invasive squamous cell carcinoma, HPV-associated Cervical biopsy 11:00: HSIL Cervical biopsy 12:00 H SIL ECC: HSIL  01/27/2022 Initial Diagnosis    Malignant neoplasm of exocervix [facility])      04-27-22 Assessment & Plan: Alison Cervantes is a 42 y.o. woman with Stage IB2 moderately differentiated squamous cell carcinoma of  the cervix (+focal LVSI on cone specimen) who is 2.5 weeks s/p abdominal radical hysterectomy with upper vaginectomy (type C1) bilateral salpingectomy, bilateral sentinel lymph node evaluation and biopsy, bilateral ovarian transposition on 04/09/22.   Postop: - Pt recovering well from surgery and healing appropriately postoperatively - Intraoperative findings and pathology results reviewed. - Ongoing postoperative expectations and precautions reviewed. Continue with no lifting >10lbs through 6 weeks postoperatively - Incision with small separation but no signs/symptoms concerning for infection.   SCC of cervix: - Reviewed intraoperative findings and pathology results in detail. - Cone specimen with 2cm tumor, 89m DOI, +focal LVSI - Hysterectomy specimen with residual 272mfocus of cancer, 23m423mOI, no LVSI - In discussion with pathologist, the cervical thickness at location of residual invasive carcinoma ranges from 1.1 to 1.8cm (area of prior cone) and the cervical stroma at its thickest is 2cm.  - Reviewed with pt that it can be challenging to combine the data from the cone specimen and the hysterectomy specimen. DOI may be 6mm70m may be 7mm 48mcal residual may not be additive from prior cone specimen). And full cervical stroma is likely somewhere between 18mm 70m20mm. 46meviewed that given 2cm tumor with LVSI, if middle third DOI, would recommend adjuvant radiation for meeting Sedlis criteria. However, it is possible that she is superficial third (possibly 6mm of 94mm). I23mich case, patient still retains a residual risk of recurrence but slightly lower risk than if meets sedlis criteria. - Reviewed observation and adjuvant radiation options. Would recommend pt have consultation with Dr. Kinard toSondra Comereview further. Most conservative option would be to radiate, but do not feel that it is unreasonable to observe. But pt would require close observation for recurrence.  - Signs/symptoms of  recurrence reviewed.    RTC 93mo [id number] pending pt's decision regarding radiation.   Meredith NBernadene Bellologic Oncology   03-24-22  Breast cancer Maternal Aunt     Uterine cancer Maternal Aunt     Ovarian cancer Maternal Aunt     Uterine cancer Cousin 19        62ternal first cousin   Breast cancer Cousin          maternal cousin with breast cancer in her 20s    Ass21sment & Plan: Alison Cervantes.o. 36 with Stage IB2 moderately differentiated SCC of the cervix (+LVSI) who presents for treatment planning discussion.   We reviewed the patient's diagnosis of early stage cervical cancer.  Based on cone and imaging her stage is IB2. The patient was counseled extensively on the treatment options for an early cervical cancer which include radical hysterectomy with pelvic lymphadenectomy or chemoradiation.  The risks and benefits of both were reviewed.  I have emphasized that the cure rates for these two treatments are equivalent; however, I discussed that the toxicity profiles are different between these two modalities.     Risks of primary radiotherapy with sensitizing chemotherapy can include cystitis, proctitis, chronic rectal bleeding sigmoid stricture, small bowel obstruction, ureteral stricture, urinary or enteral fistula, loss of sexual function related to vaginal shortening and atrophy and menopause due to ovarian ablation.    Surgery is associated with risks of bleeding, blood transfusion, injury to the bowel, bladder, ureter,  urinary fistula, vaginal dehiscence, prolonged or permanent bladder dysfunction potentially requiring self-catheterization, lymphedema, infection, thromboembolic events, and possible effects on sexual function. She understands that intraoperatively, the finding of previously unsuspected spread outside the cervix may lead to the radical hysterectomy being aborted.  Additionally, high or intermediate-risk factors on the final pathology report, such  as positive lymph nodes, deep invasion, large tumor size and lymphatic invasion, will likely lead to a recommendation of postoperative chemotherapy and radiation.    She is leaning toward proceeding with radical hysterectomy. Because the patient is premenopausal, we recommend leaving ovaries in, but bilateral fallopian tubes will be removed. We discussed ovarian transposition at time of procedure. Postoperatively, the patient will have an indwelling foley catheter at the time of discharge. She also understands that additional therapy may be recommended based on the results of her final pathology.   We also reviewed the risks/benefits of considering sentinel lymph node biopsy at time of procedure. Reviewed that this approach has been performed in tumors up to 4cm but may have best efficacy in tumors <2cm. Given that she has concerns regarding risk of lymphedema, discussed that it was reasonable to attempt sentinel lymph node biopsy and if unsuccessful, we would proceed with pelvic lymph node dissection. She is in agreement with this plan.    Pt has follow-up in person next week at which time we will confirm treatment plan and ensure healing of cervix from [facility]. Reviewed that we would wait at least 6wks from her cone to proceed with hysterectomy. Have tentatively discussed 04/09/22 at [facility] to perform sentinel lymph node biopsy.    RTC next week.   Bernadene Bell, MD Gynecologic Oncology   Date of Service: 02/24/2022   Preoperative Diagnosis: Cervical cancer   Postoperative Diagnosis: Same   Procedures: Cold knife conization, endocervical curettage   Surgeon: Bernadene Bell, MD   Past/Anticipated interventions by medical oncology, if any:  02-10-23 ASSESSMENT AND PLAN: Alison Cervantes is a 42 y.o. woman with new diagnosis of squamous cell carcinoma of the cervix.   We reviewed the patient's diagnosis of early stage cervical cancer.  On today's examination, she is a clinical stage I with no  gross mass but with hyperglandular appearance circumferentially around the external cervical os (only posterior biopsy was positive for invasive cancer). The patient was counseled extensively on the treatment options for an early cervical cancer which include radical hysterectomy with pelvic lymphadenectomy or chemoradiation.  The risks and benefits of both were reviewed.  I have emphasized that the cure rates for these two treatments are equivalent; however, I discussed that the toxicity profiles are different between these two modalities.     Reviewed newer data from [facility] trial that demonstrated that some early stage cervical cancer patients could be appropriate candidates for an extrafascial hysterectomy.  In order to determine this, would need to undergo a cervical conization to confirm size of tumor, presence or absence of LVSI, and obtain negative margins.  Following conization, would wait at least 6 weeks prior to definitive surgical treatment.  Pending results, outcomes could include recommendation for extrafascial hysterectomy, radical hysterectomy, or chemoradiation.   Risks of primary radiotherapy with sensitizing chemotherapy can include cystitis, proctitis, chronic rectal bleeding sigmoid stricture, small bowel obstruction, ureteral stricture, urinary or enteral fistula, loss of sexual function related to vaginal shortening and atrophy and menopause due to ovarian ablation.    Surgery is associated with risks of bleeding, blood transfusion, injury to the bowel, bladder, ureter, urinary fistula, vaginal dehiscence,  prolonged or permanent bladder dysfunction potentially requiring self-catheterization, lymphedema, infection, thromboembolic events, and possible effects on sexual function. She understands that intraoperatively, the finding of previously unsuspected spread outside the cervix may lead to the radical hysterectomy being aborted.  Additionally, high or intermediate-risk factors on the  final pathology report, such as positive lymph nodes, deep invasion, large tumor size and lymphatic invasion, will likely lead to a recommendation of postoperative chemotherapy and radiation.    She desires to proceed with cervical conization followed by likely surgical management of disease if continues to be an appropriate candidate.   Prior to surgery, a PET/CT has been ordered to rule out any distant metastasis that would preclude the patient from being a surgical candidate. Scheduled for 12/22.    Patient was consented for: cold knife conization, endocervical curettage on 12/26.   The risks of surgery were discussed in detail and she understands these to including but not limited to bleeding requiring a blood transfusion, infection, injury to adjacent organs (including but not limited to the bowels, bladder, ureters, nerves, blood vessels), unforseen complication, and possible need for re-exploration.   If the patient experiences any of these events, she understands that her hospitalization or recovery may be prolonged and that she may need to take additional medications for a prolonged period. The patient will receive DVT and antibiotic prophylaxis as indicated. She voiced a clear understanding. She had the opportunity to ask questions and written informed consent was obtained today. She wishes to proceed.   She does not require preoperative clearance. Her METs are >4.  All preoperative instructions were reviewed. Postoperative expectations were also reviewed.    Bernadene Bell, MD Gynecologic Oncology   Weight changes, if any: {:18581}   Bowel/Bladder complaints, if any: {yes no:[id number]}, {Blank single:19197::"diarrhea","constipation","urinary frequency","burning","trouble emptying bladder"," "}   Nausea/Vomiting, if any: {:18581}   Pain issues, if any:  {:18581}   SAFETY ISSUES: Prior radiation? {:18581} Pacemaker/ICD? {:18581} Possible current pregnancy? {:18581} Is the patient  on methotrexate? {:18581}   Current Complaints / other details:  ***

## 2022-05-05 NOTE — Progress Notes (Signed)
Radiation Oncology         [phone] ________________________________  Initial Outpatient Consultation  Name: Alison Cervantes MRN: [id number]  Date: 05/06/2022  DOB: [birth year]  CC:Fry, Ishmael Holter, MD  Bernadene Bell, MD   REFERRING PHYSICIAN: Bernadene Bell, MD  DIAGNOSIS: There were no encounter diagnoses.  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 female who is accompanied by ***. she is seen as a courtesy of Dr. Ernestina Patches for an opinion concerning radiation therapy as part of management for her recently diagnosed cervical cancer.   The patient presented to her OC/GYN, Dr. Brien Mates, on 10/20/21 for her annual exam. Pap collected at the time of this visit returned showing HPV positive ASCUS. Subsequently, she underwent a colposcopy with cervical biopsies and ECC on 01/27/22. Biopsy of the 5 o'clock cervix revealed HPV associated invasive squamous cell carcinoma. Biopsies of the 11 and 12 o'clock cervix and ECC showed HSIL.    Accordingly, the patient was referred to Dr. Ernestina Patches on 02/09/22 for further management. During this visit, the patient reported instances of postcoital bleeding for several months, and an increase in urinary incontinence and frequency. GU exam performed at the time of this visit showed the cervix as multiparous in appearance, with red hyper glandular and friable appearance circumferentially around the external cervical os, measuring about 5 mm in all directions. Bimanual exam also revealed small nodular firmness of the posterior lip of the cervix at the external cervical os.    Per Dr. Romona Curls recommendation, the patient underwent cold knife conization with EC on 02/24/22. Pathology revealed: tumor the size of 2 cm; histology of invasive moderately differentiated  squamous cell carcinoma or the cervix associated with extensive squamous cell carcinoma in situ; stromal invasion of 6 mm; ectocervical and deep margins in the 12-3 o'clock area involved by invasive carcinoma; endocervical and deep margins in the 6-9 o'clock area involved by HSIL and in situ carcinoma; positive for focal LVI. Endocervical curettage showed detached fragments of at least squamous cell carcinoma in situ with benign endocervical mucosa.      PET scan on 03/23/22 for staging work-up demonstrated focal FDG uptake in the posterior cervix likely at the site of biopsy proven SCC. PET otherwise showed no distant sites of metastatic disease.   MRI of the pelvis also performed on 03/23/22 showed a mild irregularity measuring approximately 6 x 10 mm along the left para-midline of the posterior cervix, nearly at the midline, without gross enhancing lesion, or signs of parametrial extension. MRI also showed an intermediate T2 signal along the margin near the anterior cervical os centered more in the midline measuring 14 x 7 mm. MRI otherwise showed no evidence of adenopathy in the pelvis.  Following a detailed discussion of the risks and benefits, the patient opted to proceed with abdominal radical hysterectomy with upper vaginectomy, bilateral salpingectomy, bilateral sentinel lymph node evaluation, and bilateral ovarian transposition on 04/09/22 under the care of Dr. Ernestina Patches. Pathology from the procedure revealed: focal residual invasive squamous cell carcinoma measuring 2 mm; depth of invasion of 1 mm involving the 12 to 3 o'clock quadrant; focal HSIL present in the 9-12 o'clock margin; parametrium, endometrium, myometrium, fallopian tubes, and vaginal cuff negative for carcinoma; nodal status of 5/5 sentinel pelvic lymph nodes negative for carcinoma (consisting of 1 left external iliac SLN, 2 right obturator SLN's,  and 1 right external iliac SLN). (Oophorectomy was not performed in light of the patient's  pre-menopausal status).   Of note: The patient had a screening mammogram performed in October 2023 which showed a possible abnormality in the left breast. Diagnostic left breast mammogram and left breast ultrasound on 12/20/21 showed a probably benign mass in the 2 o'clock left breast, 4 cmfn.    PREVIOUS RADIATION THERAPY: No  PAST MEDICAL HISTORY:  Past Medical History:  Diagnosis Date   ADHD (attention deficit hyperactivity disorder)    Cervical cancer ([place])    Depression    Family history of breast cancer    Family history of colon cancer    Family history of melanoma    Family history of ovarian cancer    Family history of prostate cancer    Family history of uterine cancer    History of anemia    History of migraine    History of peptic ulcer disease    Psoriasis    Vitamin D deficiency    Wears glasses     PAST SURGICAL HISTORY: Past Surgical History:  Procedure Laterality Date   CERVICAL CONIZATION W/BX N/A 02/24/2022   Procedure: CONIZATION CERVIX WITH BIOPSY;  Surgeon: Bernadene Bell, MD;  Location: [facility];  Service: Gynecology;  Laterality: N/A;   NO PAST SURGERIES      FAMILY HISTORY:  Family History  Problem Relation Age of Onset   Hyperlipidemia Mother    Hypertension Mother    Melanoma Mother    Alcohol abuse Father    Heart disease Father    Hyperlipidemia Father    Hypertension Father    Diabetes Maternal Grandmother    Heart disease Maternal Grandmother    Hyperlipidemia Maternal Grandmother    Hypertension Maternal Grandmother    Breast cancer Maternal Grandmother    Hyperlipidemia Paternal Grandmother    Hypertension Paternal Grandmother    Leukemia Paternal Grandmother    Brain cancer Paternal Grandmother    Prostate cancer Paternal Grandfather    Diabetes Paternal Grandfather    Hyperlipidemia Paternal Grandfather    Hypertension Paternal Grandfather    Colon cancer Paternal Grandfather    Stomach cancer Paternal  Grandfather    Breast cancer Maternal Aunt    Uterine cancer Maternal Aunt    Ovarian cancer Maternal Aunt    Uterine cancer Cousin 24       maternal first cousin   Breast cancer Cousin        maternal cousin with breast cancer in her 15s   Alcohol abuse Other        family hx   Breast cancer Other        1st degree family <50   Coronary artery disease Other        1st degree ><60   Diabetes Other        family hx   Hypertension Other        family hx   Lung cancer Other        family hx    SOCIAL HISTORY:  Social History   Tobacco Use   Smoking status: Never   Smokeless tobacco: Never  Vaping Use   Vaping Use: Never used  Substance Use Topics   Alcohol use: Not Currently   Drug use: Not Currently    Comment: 02-20-2022  per pt smoked marijuana few months ago    ALLERGIES:  Allergies  Allergen Reactions   Valerian Root Rash  MEDICATIONS:  Current Outpatient Medications  Medication Sig Dispense Refill   amphetamine-dextroamphetamine (ADDERALL) 30 MG tablet Take 1 tablet by mouth 2 (two) times daily. 60 tablet 0   Cholecalciferol (VITAMIN D-3 PO) Take 1 capsule by mouth daily.     enoxaparin (LOVENOX) 40 MG/0.4ML injection Inject 40 mg into the skin daily.     hydrocortisone cream 1 % Apply 1 Application topically 2 (two) times daily as needed for itching.     Omega-3 Fatty Acids (FISH OIL PO) Take 1-2 capsules by mouth daily.     oxyCODONE (OXY IR/ROXICODONE) 5 MG immediate release tablet Take 1 tablet (5 mg total) by mouth every 4 (four) hours as needed for severe pain. 12 tablet 0   polyethylene glycol powder (GLYCOLAX/MIRALAX) 17 GM/SCOOP powder Take 1 Container by mouth daily.     senna-docusate (SENOKOT-S) 8.6-50 MG tablet Take 2 tablets by mouth at bedtime. For AFTER surgery, do not take if having diarrhea 30 tablet 0   traZODone (DESYREL) 50 MG tablet Take 1 tablet by mouth at bedtime.     No current facility-administered medications for this encounter.     REVIEW OF SYSTEMS:  A 10+ POINT REVIEW OF SYSTEMS WAS OBTAINED including neurology, dermatology, psychiatry, cardiac, respiratory, lymph, extremities, GI, GU, musculoskeletal, constitutional, reproductive, HEENT. ***   PHYSICAL EXAM:  vitals were not taken for this visit.   General: Alert and oriented, in no acute distress HEENT: Head is normocephalic. Extraocular movements are intact. Oropharynx is clear. Neck: Neck is supple, no palpable cervical or supraclavicular lymphadenopathy. Heart: Regular in rate and rhythm with no murmurs, rubs, or gallops. Chest: Clear to auscultation bilaterally, with no rhonchi, wheezes, or rales. Abdomen: Soft, nontender, nondistended, with no rigidity or guarding. Extremities: No cyanosis or edema. Lymphatics: see Neck Exam Skin: No concerning lesions. Musculoskeletal: symmetric strength and muscle tone throughout. Neurologic: Cranial nerves II through XII are grossly intact. No obvious focalities. Speech is fluent. Coordination is intact. Psychiatric: Judgment and insight are intact. Affect is appropriate.  On pelvic examination the external genitalia were unremarkable. A speculum exam was performed. There are no mucosal lesions noted in the vaginal vault. A Pap smear was obtained of the proximal vagina. On bimanual and rectovaginal examination there were no pelvic masses appreciated. ***   ECOG = ***  0 - Asymptomatic (Fully active, able to carry on all predisease activities without restriction)  1 - Symptomatic but completely ambulatory (Restricted in physically strenuous activity but ambulatory and able to carry out work of a light or sedentary nature. For example, light housework, office work)  2 - Symptomatic, <50% in bed during the day (Ambulatory and capable of all self care but unable to carry out any work activities. Up and about more than 50% of waking hours)  3 - Symptomatic, >50% in bed, but not bedbound (Capable of only limited  self-care, confined to bed or chair 50% or more of waking hours)  4 - Bedbound (Completely disabled. Cannot carry on any self-care. Totally confined to bed or chair)  5 - Death   Eustace Pen MM, Creech RH, Tormey DC, et al. [phone]). "Toxicity and response criteria of the [facility]". [place] Oncol. 5 (6): 649-55  LABORATORY DATA:  Lab Results  Component Value Date   WBC 7.5 07/14/2021   HGB 14.2 07/14/2021   HCT 42.2 07/14/2021   MCV 97.9 07/14/2021   PLT 204.0 07/14/2021   NEUTROABS 5.3 07/14/2021   Lab Results  Component Value  Date   NA 138 07/14/2021   K 3.8 07/14/2021   CL 106 07/14/2021   CO2 24 07/14/2021   GLUCOSE 79 07/14/2021   BUN 15 07/14/2021   CREATININE 0.61 07/14/2021   CALCIUM 9.4 07/14/2021      RADIOGRAPHY: No results found.    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  We discussed the natural history of *** and general treatment, highlighting the role of radiotherapy in the management.  We discussed the available radiation techniques, and focused on the details of logistics and delivery.  We reviewed the anticipated acute and late sequelae associated with radiation in this setting.  The patient was encouraged to ask questions that I answered to the best of my ability. *** A patient consent form was discussed and signed.  We retained a copy for our records.  The patient would like to proceed with radiation and will be scheduled for CT simulation.  PLAN: ***    *** minutes of total time was spent for this patient encounter, including preparation, face-to-face counseling with the patient and coordination of care, physical exam, and documentation of the encounter.    ------------------------------------------------  Blair Promise, PhD, MD  This document serves as a record of services personally performed by Gery Pray, MD. It was created on his behalf by Roney Mans, a trained medical scribe. The creation of this record is based on the scribe's personal observations and the provider's statements to them. This document has been checked and approved by the attending provider.

## 2022-05-06 ENCOUNTER — Ambulatory Visit
Admission: RE | Admit: 2022-05-06 | Discharge: 2022-05-06 | Disposition: A | Discharge status: Home / Self Care | Payer: 59 | Source: Ambulatory Visit | Attending: Radiation Oncology | Admitting: Radiation Oncology

## 2022-05-06 ENCOUNTER — Encounter: Payer: Self-pay | Admitting: Radiation Oncology

## 2022-05-06 ENCOUNTER — Other Ambulatory Visit: Payer: Self-pay

## 2022-05-06 VITALS — BP 113/73 | HR 91 | Temp 97.7°F | Resp 18 | Ht 62.0 in | Wt 149.4 lb

## 2022-05-06 DIAGNOSIS — Z808 Family history of malignant neoplasm of other organs or systems: Secondary | ICD-10-CM | POA: Insufficient documentation

## 2022-05-06 DIAGNOSIS — E559 Vitamin D deficiency, unspecified: Secondary | ICD-10-CM | POA: Diagnosis not present

## 2022-05-06 DIAGNOSIS — Z8711 Personal history of peptic ulcer disease: Secondary | ICD-10-CM | POA: Insufficient documentation

## 2022-05-06 DIAGNOSIS — Z8042 Family history of malignant neoplasm of prostate: Secondary | ICD-10-CM | POA: Insufficient documentation

## 2022-05-06 DIAGNOSIS — Z8041 Family history of malignant neoplasm of ovary: Secondary | ICD-10-CM | POA: Insufficient documentation

## 2022-05-06 DIAGNOSIS — C538 Malignant neoplasm of overlapping sites of cervix uteri: Secondary | ICD-10-CM | POA: Insufficient documentation

## 2022-05-06 DIAGNOSIS — Z800 Family history of malignant neoplasm of digestive organs: Secondary | ICD-10-CM | POA: Diagnosis not present

## 2022-05-06 DIAGNOSIS — Z803 Family history of malignant neoplasm of breast: Secondary | ICD-10-CM | POA: Diagnosis not present

## 2022-05-06 DIAGNOSIS — C531 Malignant neoplasm of exocervix: Secondary | ICD-10-CM

## 2022-05-11 ENCOUNTER — Encounter: Payer: Self-pay | Admitting: Psychiatry

## 2022-05-18 ENCOUNTER — Encounter: Payer: Self-pay | Admitting: Psychiatry

## 2022-05-21 ENCOUNTER — Other Ambulatory Visit: Payer: Self-pay | Admitting: Family Medicine

## 2022-05-25 MED ORDER — AMPHETAMINE-DEXTROAMPHETAMINE 30 MG PO TABS: 30.0000 mg | ORAL_TABLET | Freq: Two times a day (BID) | ORAL | 0 refills | Status: DC

## 2022-05-25 NOTE — Telephone Encounter (Signed)
Last refill-04/15/22-60 tabs, 0 refills Last OV-07/14/21  No future OV scheduled.

## 2022-05-25 NOTE — Telephone Encounter (Signed)
Done

## 2022-05-29 ENCOUNTER — Telehealth: Payer: Self-pay

## 2022-05-29 ENCOUNTER — Other Ambulatory Visit (HOSPITAL_COMMUNITY): Payer: Self-pay

## 2022-05-29 NOTE — Telephone Encounter (Signed)
Patient Advocate Encounter  Prior authorization for Amphetamine-Dextroamphetamine 30MG  tablets submitted and APPROVED on 05-29-2022.  Test billing returns $0.00 copay for 30 day supply.  [facility] Effective: 05-29-2022 - 05-28-2025

## 2022-06-03 NOTE — Telephone Encounter (Signed)
Left detailed message on pt mobile number advised to contact her pharmacy for adderall refill.

## 2022-06-08 ENCOUNTER — Telehealth: Payer: Self-pay

## 2022-06-08 NOTE — Telephone Encounter (Signed)
Fitness for duty forms faxed to [facility] [employer]. Fax# [phone].  Pt aware via voicemail

## 2022-06-12 ENCOUNTER — Ambulatory Visit
Admission: RE | Admit: 2022-06-12 | Discharge: 2022-06-12 | Disposition: A | Discharge status: Home / Self Care | Payer: 59 | Source: Ambulatory Visit | Attending: Obstetrics and Gynecology | Admitting: Obstetrics and Gynecology

## 2022-06-12 ENCOUNTER — Other Ambulatory Visit: Payer: Self-pay | Admitting: Obstetrics and Gynecology

## 2022-06-12 DIAGNOSIS — N6489 Other specified disorders of breast: Secondary | ICD-10-CM

## 2022-06-12 DIAGNOSIS — N6310 Unspecified lump in the right breast, unspecified quadrant: Secondary | ICD-10-CM

## 2022-07-06 ENCOUNTER — Inpatient Hospital Stay: Payer: 59 | Admitting: Psychiatry

## 2022-07-06 DIAGNOSIS — C531 Malignant neoplasm of exocervix: Secondary | ICD-10-CM

## 2022-07-06 NOTE — Progress Notes (Signed)
This encounter was created in error - please disregard. ?Rescheduled.  ?

## 2022-07-15 NOTE — Progress Notes (Signed)
Gynecologic Oncology Return Clinic Visit  Date of Service: 07/29/2022 Referring Provider: Derl Barrow, MD    Assessment & Plan: Alison Cervantes is a 42 y.o. 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   SCC of cervix: - Pt was previously counseled on risk factors for recurrence that are somewhat indeterminate based on pathology combined from cone specimen and hysterectomy specimen. Following consultation with radiation oncology, pt has opted to forgo adjuvant treatment.  - NED on exam today - Recommend continued surveillance q49mo. - Plan 21mo PET (currently scheduled for 10/19/22).  Fatigue/unsteady gait: - No focal abnormalities on exam - Unclear etiology at this time. Only occurring for 1 week. CTM at this time to see if worsening/persistent. - CBC, BMP today wnl.    RTC 105mo  Clide Cliff, MD Gynecologic Oncology   Medical Decision Making I personally spent  TOTAL 25 minutes face-to-face and non-face-to-face in the care of this patient, which includes all pre, intra, and post visit time on the date of service.    ----------------------- Reason for Visit: Surveillance  Treatment History: Oncology History  Malignant neoplasm of exocervix (HCC)  10/20/2021 Pathology Results   ASCUS, HPV16+   01/27/2022 Procedure   Colposcopy: Cervical biopsy at 5:00: Invasive squamous cell carcinoma, HPV-associated Cervical biopsy 11:00: HSIL Cervical biopsy 12:00 H SIL ECC: HSIL   01/27/2022 Initial Diagnosis   Malignant neoplasm of exocervix (HCC)   02/24/2022 Surgery   [facility]   02/24/2022 Pathology Results   A. CERVIX, CONE:  - Invasive moderately differentiated squamous cell  carcinoma associated  with extensive squamous cell carcinoma in situ  - Invasive carcinoma is 2 cm and invades to a depth of 0.6 cm (6 mm)  - Invasive carcinoma involves the ectocervical and deep margins in the  12-3 o'clock quadrant  - Carcinoma in situ involves the endocervical and deep margins in the  6-9 o'clock quadrant  - Focal lymphovascular space involvement  - See oncology table   B. ENDOCERVIX, CURETTAGE:  - Detached fragments of at least squamous cell carcinoma in situ  - Benign endocervical mucosa    03/23/2022 Imaging   PET: IMPRESSION: 1. Focal FDG uptake in the posterior cervix likely the site of reported neoplasm. 2. No signs of distant metastatic disease. 3. Signs of brown fat activation in the paraspinal regions and in the posterior mediastinum.  ADDENDUM: Revised impression: Cervical activity remains suspicious though in light of recent surgery is less specific for residual disease.   03/23/2022 Imaging   MRI Pelvis: IMPRESSION: Some susceptibility artifact in the area of the cervix may relate to recent surgical change. Mild irregularity at the 6:00 to 9:00 position in the lower cervix and anteriorly along the lower cervical margin without gross enhancing lesion, or signs of parametrial extension.   No adenopathy in the pelvis.   Above findings are limited however along the LEFT cervical margin due to susceptibility artifact from rectal gas. Would suggest the patient return for repeat imaging after restricting diet of large meals, high-fiber and caffeine or carbonated beverages 24 hours prior to scanning.  ADDENDUM: Limitations are as outlined previously. The previously reported "RIGHT posterolateral" abnormality is actually LEFT paramidline posterior cervix, nearly in the midline.   Intermediate T2 signal along the margin near the anterior  cervical os is centered more in the midline. Bowel gas limits assessment on both diffusion and postcontrast  imaging.     Interval History: Today, patient reports a few new symptoms including some general fatigue and occasional easy bruising.  She also notes that in the past week she has felt some gait instability.  She denies dizziness or feeling like the room is spinning.  She also denies ringing in the ears, hearing change, vision change, headache, or recent URI symptoms.  She otherwise denies new vaginal bleeding, abdominal/pelvic pain, unintentional weight loss, change in bowel or bladder habits, early satiety, bloating, nausea/vomiting.     Past Medical/Surgical History: Past Medical History:  Diagnosis Date   ADHD (attention deficit hyperactivity disorder)    Cervical cancer (HCC)    Depression    Family history of breast cancer    Family history of colon cancer    Family history of melanoma    Family history of ovarian cancer    Family history of prostate cancer    Family history of uterine cancer    History of anemia    History of migraine    History of peptic ulcer disease    Psoriasis    Vitamin D deficiency    Wears glasses     Past Surgical History:  Procedure Laterality Date   CERVICAL CONIZATION W/BX N/A 02/24/2022   Procedure: CONIZATION CERVIX WITH BIOPSY;  Surgeon: Clide Cliff, MD;  Location: [facility] [place];  Service: Gynecology;  Laterality: N/A;   NO PAST SURGERIES      Family History  Problem Relation Age of Onset   Hyperlipidemia Mother    Hypertension Mother    Melanoma Mother    Alcohol abuse Father    Heart disease Father    Hyperlipidemia Father    Hypertension Father    Diabetes Maternal Grandmother    Heart disease Maternal Grandmother    Hyperlipidemia Maternal Grandmother    Hypertension Maternal Grandmother    Breast cancer Maternal Grandmother    Hyperlipidemia Paternal Grandmother    Hypertension Paternal Grandmother    Leukemia Paternal Grandmother    Brain cancer Paternal Grandmother    Prostate cancer Paternal  Grandfather    Diabetes Paternal Grandfather    Hyperlipidemia Paternal Grandfather    Hypertension Paternal Grandfather    Colon cancer Paternal Grandfather    Stomach cancer Paternal Grandfather    Breast cancer Maternal Aunt    Uterine cancer Maternal Aunt    Ovarian cancer Maternal Aunt    Uterine cancer Cousin 49       maternal first cousin   Breast cancer Cousin        maternal cousin with breast cancer in her 92s   Alcohol abuse Other        family hx   Breast cancer Other        1st degree family <50   Coronary artery disease Other        1st degree ><60   Diabetes Other        family hx   Hypertension Other        family hx   Lung cancer Other        family hx    Social History   Socioeconomic History   Marital status: Significant Other    Spouse name: Not on file   Number of children: Not on file   Years of education: Not on file   Highest education level: Not on file  Occupational History   Not on file  Tobacco Use   Smoking status: Never   Smokeless tobacco: Never  Vaping Use   Vaping Use: Never used  Substance and Sexual Activity   Alcohol use: Not Currently   Drug use: Not Currently    Comment: 02-20-2022  per pt smoked marijuana few months ago   Sexual activity: Not on file  Other Topics Concern   Not on file  Social History Narrative   Not on file   Social Determinants of Health   Financial Resource Strain: Not on file  Food Insecurity: Not on file  Transportation Needs: No Transportation Needs (02/18/2022)   PRAPARE - Administrator, Civil Service (Medical): No    Lack of Transportation (Non-Medical): No  Physical Activity: Not on file  Stress: Not on file  Social Connections: Not on file    Current Medications:  Current Outpatient Medications:    amphetamine-dextroamphetamine (ADDERALL) 30 MG tablet, Take 1 tablet by mouth 2 (two) times daily., Disp: 60 tablet, Rfl: 0   Cholecalciferol (VITAMIN D-3 PO), Take 1 capsule  by mouth daily., Disp: , Rfl:    hydrocortisone cream 1 %, Apply 1 Application topically 2 (two) times daily as needed for itching., Disp: , Rfl:    Omega-3 Fatty Acids (FISH OIL PO), Take 1-2 capsules by mouth daily., Disp: , Rfl:    oxyCODONE (OXY IR/ROXICODONE) 5 MG immediate release tablet, Take 1 tablet (5 mg total) by mouth every 4 (four) hours as needed for severe pain., Disp: 12 tablet, Rfl: 0   senna-docusate (SENOKOT-S) 8.6-50 MG tablet, Take 2 tablets by mouth at bedtime. For AFTER surgery, do not take if having diarrhea, Disp: 30 tablet, Rfl: 0   traZODone (DESYREL) 50 MG tablet, Take 1 tablet by mouth at bedtime., Disp: , Rfl:   Review of Symptoms: Complete 10-system review is negative except as above in Interval History.  Physical Exam: BP 107/73 (BP Location: Left Arm, Patient Position: Sitting)   Pulse 80   Temp 98.5 F (36.9 C) (Oral)   Resp 20   Ht 5' 2.99" (1.6 m)   Wt 146 lb (66.2 kg)   SpO2 100%   BMI 25.87 kg/m  General: Alert, oriented, no acute distress. HEENT: Normocephalic, atraumatic. Neck symmetric without masses. Sclera anicteric.  Chest: Normal work of breathing. Clear to auscultation bilaterally.   Cardiovascular: Regular rate and rhythm, no murmurs. Abdomen: Soft, nontender.  Normoactive bowel sounds.  No masses appreciated.  Well healed midline incision. Extremities: Grossly normal range of motion.  Warm, well perfused.  No edema bilaterally. Skin: No rashes or lesions noted. CV: no cervical, supraclavicular or inguinal LAD Neuro: normal sensation grossly in bilateral LEs.5/5 strength blt LEs. Normal reflex bilaterally at knee. GU: Normal appearing external genitalia without erythema, excoriation, or lesions.  Speculum exam reveals normal vaginal mucosa and normal vaginal cuff.  Bimanual exam reveals smooth vaginal cuff without nodularity; no pelvic mass. Exam chaperoned by Terald Sleeper, LPN    Laboratory & Radiologic Studies: Lab Results   Component Value Date   NA 140 07/29/2022   K 4.8 07/29/2022   CL 108 07/29/2022   CO2 28 07/29/2022   ANIONGAP 4 (L) 07/29/2022   BUN 15 07/29/2022   CREATININE 0.66 07/29/2022   BCR NOT APPLICABLE 11/15/2019   CALCIUM 8.9 07/29/2022   Lab Results  Component Value Date   WBC 7.6 07/29/2022   HGB 13.6 07/29/2022   HCT 40.4 07/29/2022   PLT  208 07/29/2022   NEUTROABS 5.3 07/14/2021

## 2022-07-29 ENCOUNTER — Other Ambulatory Visit: Payer: Self-pay

## 2022-07-29 ENCOUNTER — Inpatient Hospital Stay: Payer: 59

## 2022-07-29 ENCOUNTER — Inpatient Hospital Stay: Payer: 59 | Attending: Psychiatry | Admitting: Psychiatry

## 2022-07-29 VITALS — BP 107/73 | HR 80 | Temp 98.5°F | Resp 20 | Ht 62.99 in | Wt 146.0 lb

## 2022-07-29 DIAGNOSIS — Z8541 Personal history of malignant neoplasm of cervix uteri: Secondary | ICD-10-CM | POA: Diagnosis not present

## 2022-07-29 DIAGNOSIS — C531 Malignant neoplasm of exocervix: Secondary | ICD-10-CM

## 2022-07-29 DIAGNOSIS — Z9079 Acquired absence of other genital organ(s): Secondary | ICD-10-CM | POA: Diagnosis not present

## 2022-07-29 DIAGNOSIS — R5383 Other fatigue: Secondary | ICD-10-CM | POA: Insufficient documentation

## 2022-07-29 DIAGNOSIS — R2681 Unsteadiness on feet: Secondary | ICD-10-CM | POA: Insufficient documentation

## 2022-07-29 DIAGNOSIS — Z90710 Acquired absence of both cervix and uterus: Secondary | ICD-10-CM | POA: Diagnosis not present

## 2022-07-29 LAB — BASIC METABOLIC PANEL - CANCER CENTER ONLY
Anion gap: 4 — ABNORMAL LOW (ref 5–15)
BUN: 15 mg/dL (ref 6–20)
CO2: 28 mmol/L (ref 22–32)
Calcium: 8.9 mg/dL (ref 8.9–10.3)
Chloride: 108 mmol/L (ref 98–111)
Creatinine: 0.66 mg/dL (ref 0.44–1.00)
GFR, Estimated: >60 mL/min (ref >60)
Glucose, Bld: 67 mg/dL — ABNORMAL LOW (ref 70–99)
Potassium: 4.8 mmol/L (ref 3.5–5.1)
Sodium: 140 mmol/L (ref 135–145)

## 2022-07-29 LAB — CBC (CANCER CENTER ONLY)
HCT: 40.4 % (ref 36.0–46.0)
Hemoglobin: 13.6 g/dL (ref 12.0–15.0)
MCH: 32.6 pg (ref 26.0–34.0)
MCHC: 33.7 g/dL (ref 30.0–36.0)
MCV: 96.9 fL (ref 80.0–100.0)
Platelet Count: 208 10*3/uL (ref 150–400)
RBC: 4.17 MIL/uL (ref 3.87–5.11)
RDW: 12.7 % (ref 11.5–15.5)
WBC Count: 7.6 10*3/uL (ref 4.0–10.5)
nRBC: 0 % (ref 0.0–0.2)

## 2022-07-29 NOTE — Patient Instructions (Signed)
It was a pleasure to see you in clinic today. - Exam normal. - Plan PET in 3 months - Return visit planned for 3 months  Thank you very much for allowing me to provide care for you today.  I appreciate your confidence in choosing our Gynecologic Oncology team at [facility].  If you have any questions about your visit today please call our office or send [place] a MyChart message and we will get back to you as soon as possible.

## 2022-08-05 ENCOUNTER — Encounter: Payer: Self-pay | Admitting: Psychiatry

## 2022-08-31 ENCOUNTER — Other Ambulatory Visit: Payer: Self-pay | Admitting: Family Medicine

## 2022-09-01 NOTE — Telephone Encounter (Signed)
This has already been sent to pt pharmacy. Please close the encounter having trouble due to control

## 2022-09-01 NOTE — Telephone Encounter (Signed)
Done

## 2022-09-11 ENCOUNTER — Telehealth: Payer: Self-pay | Admitting: *Deleted

## 2022-09-11 NOTE — Telephone Encounter (Signed)
LMOM both yesterday and today for the patient to call the office back. Patient has possible paperwork to be filled out.

## 2022-09-14 NOTE — Telephone Encounter (Signed)
LMOM for the patient to call the office back. Patient has possible paperwork to be filled out.

## 2022-10-17 ENCOUNTER — Encounter: Payer: Self-pay | Admitting: Psychiatry

## 2022-10-19 ENCOUNTER — Encounter (HOSPITAL_COMMUNITY): Payer: 59

## 2022-10-26 ENCOUNTER — Inpatient Hospital Stay: Payer: 59 | Attending: Psychiatry | Admitting: Psychiatry

## 2022-10-26 ENCOUNTER — Encounter: Payer: Self-pay | Admitting: Psychiatry

## 2022-10-26 VITALS — BP 133/89 | HR 99 | Temp 98.6°F | Resp 18 | Wt 155.8 lb

## 2022-10-26 DIAGNOSIS — Z8541 Personal history of malignant neoplasm of cervix uteri: Secondary | ICD-10-CM | POA: Diagnosis not present

## 2022-10-26 DIAGNOSIS — N898 Other specified noninflammatory disorders of vagina: Secondary | ICD-10-CM

## 2022-10-26 DIAGNOSIS — Z90710 Acquired absence of both cervix and uterus: Secondary | ICD-10-CM | POA: Insufficient documentation

## 2022-10-26 DIAGNOSIS — C531 Malignant neoplasm of exocervix: Secondary | ICD-10-CM

## 2022-10-26 DIAGNOSIS — Z9079 Acquired absence of other genital organ(s): Secondary | ICD-10-CM | POA: Insufficient documentation

## 2022-10-26 NOTE — Patient Instructions (Signed)
It was a pleasure to see you in clinic today. -We will let you know we hear about the PET scan.  If not approved, we will plan to get a CT scan instead. - I will let you know what the biopsy results show. - Return visit planned for 3 mo  Thank you very much for allowing me to provide care for you today.  I appreciate your confidence in choosing our Gynecologic Oncology team at [facility].  If you have any questions about your visit today please call our office or send [place] a MyChart message and we will get back to you as soon as possible.

## 2022-10-26 NOTE — Progress Notes (Signed)
Gynecologic Oncology Return Clinic Visit  Date of Service: 10/26/2022 Referring Provider: Derl Barrow, MD    Assessment & Plan: Alison Cervantes is a 42 y.o. 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   SCC of cervix: - Pt was previously counseled on risk factors for recurrence that are somewhat indeterminate based on pathology combined from cone specimen and hysterectomy specimen. Following consultation with radiation oncology, pt has opted to forgo adjuvant treatment.  - Exam with small polypoid tissue at right vaginal apex, otherwise NED. Vaginal biopsy as below. - If benign biopsy, continue recommended surveillance q56mo x2 years, then q29mo. - Plan annual pap. - PET was previously scheduled for 10/19/22 but not approved by insurance. Pending appeal. If not approved will likely plan CT scan given increased risk of recurrence as previously documented due to indeterminate risk factors, not clearly meeting sedlis criteria.Will let pt know when we hear about the appeal.    RTC 14mo  Clide Cliff, MD Gynecologic Oncology   Medical Decision Making I personally spent  TOTAL 33 minutes face-to-face and non-face-to-face in the care of this patient, which includes all pre, intra, and post visit time on the date of service.    ----------------------- Reason for Visit: Surveillance  Treatment History: Oncology History  Malignant neoplasm of exocervix (HCC)  10/20/2021 Pathology Results   ASCUS, HPV16+   01/27/2022 Procedure   Colposcopy: Cervical biopsy at 5:00: Invasive squamous cell carcinoma, HPV-associated Cervical biopsy 11:00: HSIL Cervical biopsy 12:00 H SIL ECC: HSIL    01/27/2022 Initial Diagnosis   Malignant neoplasm of exocervix (HCC)   02/24/2022 Surgery   [facility]   02/24/2022 Pathology Results   A. CERVIX, CONE:  - Invasive moderately differentiated squamous cell carcinoma associated  with extensive squamous cell carcinoma in situ  - Invasive carcinoma is 2 cm and invades to a depth of 0.6 cm (6 mm)  - Invasive carcinoma involves the ectocervical and deep margins in the  12-3 o'clock quadrant  - Carcinoma in situ involves the endocervical and deep margins in the  6-9 o'clock quadrant  - Focal lymphovascular space involvement  - See oncology table   B. ENDOCERVIX, CURETTAGE:  - Detached fragments of at least squamous cell carcinoma in situ  - Benign endocervical mucosa    03/23/2022 Imaging   PET: IMPRESSION: 1. Focal FDG uptake in the posterior cervix likely the site of reported neoplasm. 2. No signs of distant metastatic disease. 3. Signs of brown fat activation in the paraspinal regions and in the posterior mediastinum.  ADDENDUM: Revised impression: Cervical activity remains suspicious though in light of recent surgery is less specific for residual disease.   03/23/2022 Imaging   MRI Pelvis: IMPRESSION: Some susceptibility artifact in the area of the cervix may relate to recent surgical change. Mild irregularity at the 6:00 to 9:00 position in the lower cervix and anteriorly along the lower cervical margin without gross enhancing lesion, or signs of parametrial extension.   No adenopathy in the pelvis.   Above findings are limited however along the LEFT cervical margin due to susceptibility artifact from rectal gas. Would suggest the patient return for repeat imaging after restricting diet of large meals, high-fiber and caffeine or carbonated beverages 24 hours prior to scanning.  ADDENDUM: Limitations are  as outlined previously. The previously reported "RIGHT posterolateral" abnormality is actually LEFT  paramidline posterior cervix, nearly in the midline.   Intermediate T2 signal along the margin near the anterior cervical os is centered more in the midline. Bowel gas limits assessment on both diffusion and postcontrast imaging.     Interval History: Today, she reports that she is overall doing well.  Her previously noted gait issues have resolved and have not returned.  She otherwise denies any new vaginal bleeding, abdominal/pelvic pain, unintentional weight loss, change in bowel or bladder habits, early satiety, bloating, nausea/vomiting.    Past Medical/Surgical History: Past Medical History:  Diagnosis Date   ADHD (attention deficit hyperactivity disorder)    Cervical cancer (HCC)    Depression    Family history of breast cancer    Family history of colon cancer    Family history of melanoma    Family history of ovarian cancer    Family history of prostate cancer    Family history of uterine cancer    History of anemia    History of migraine    History of peptic ulcer disease    Psoriasis    Vitamin D deficiency    Wears glasses     Past Surgical History:  Procedure Laterality Date   CERVICAL CONIZATION W/BX N/A 02/24/2022   Procedure: CONIZATION CERVIX WITH BIOPSY;  Surgeon: Clide Cliff, MD;  Location: [facility] [place];  Service: Gynecology;  Laterality: N/A;   NO PAST SURGERIES      Family History  Problem Relation Age of Onset   Hyperlipidemia Mother    Hypertension Mother    Melanoma Mother    Alcohol abuse Father    Heart disease Father    Hyperlipidemia Father    Hypertension Father    Diabetes Maternal Grandmother    Heart disease Maternal Grandmother    Hyperlipidemia Maternal Grandmother    Hypertension Maternal Grandmother    Breast cancer Maternal Grandmother    Hyperlipidemia Paternal Grandmother    Hypertension Paternal Grandmother    Leukemia Paternal Grandmother    Brain cancer Paternal Grandmother    Prostate cancer  Paternal Grandfather    Diabetes Paternal Grandfather    Hyperlipidemia Paternal Grandfather    Hypertension Paternal Grandfather    Colon cancer Paternal Grandfather    Stomach cancer Paternal Grandfather    Breast cancer Maternal Aunt    Uterine cancer Maternal Aunt    Ovarian cancer Maternal Aunt    Uterine cancer Cousin 22       maternal first cousin   Breast cancer Cousin        maternal cousin with breast cancer in her 75s   Alcohol abuse Other        family hx   Breast cancer Other        1st degree family <50   Coronary artery disease Other        1st degree ><60   Diabetes Other        family hx   Hypertension Other        family hx   Lung cancer Other        family hx    Social History   Socioeconomic History   Marital status: Significant Other    Spouse name: Not on file   Number of children: Not on file   Years of education: Not on file   Highest education level: Not on file  Occupational History   Not on  file  Tobacco Use   Smoking status: Never   Smokeless tobacco: Never  Vaping Use   Vaping status: Never Used  Substance and Sexual Activity   Alcohol use: Not Currently   Drug use: Not Currently    Comment: 02-20-2022  per pt smoked marijuana few months ago   Sexual activity: Not on file  Other Topics Concern   Not on file  Social History Narrative   Not on file   Social Determinants of Health   Financial Resource Strain: Not on file  Food Insecurity: Not on file  Transportation Needs: No Transportation Needs (02/18/2022)   PRAPARE - Administrator, Civil Service (Medical): No    Lack of Transportation (Non-Medical): No  Physical Activity: Not on file  Stress: Not on file  Social Connections: Not on file    Current Medications:  Current Outpatient Medications:    amphetamine-dextroamphetamine (ADDERALL) 30 MG tablet, Take 1 tablet by mouth 2 (two) times daily., Disp: 60 tablet, Rfl: 0   amphetamine-dextroamphetamine  (ADDERALL) 30 MG tablet, Take 1 tablet by mouth 2 (two) times daily., Disp: 60 tablet, Rfl: 0   amphetamine-dextroamphetamine (ADDERALL) 30 MG tablet, Take 1 tablet by mouth 2 (two) times daily., Disp: 60 tablet, Rfl: 0   Cholecalciferol (VITAMIN D-3 PO), Take 1 capsule by mouth daily., Disp: , Rfl:    hydrocortisone cream 1 %, Apply 1 Application topically 2 (two) times daily as needed for itching., Disp: , Rfl:    Omega-3 Fatty Acids (FISH OIL PO), Take 1-2 capsules by mouth daily., Disp: , Rfl:    oxyCODONE (OXY IR/ROXICODONE) 5 MG immediate release tablet, Take 1 tablet (5 mg total) by mouth every 4 (four) hours as needed for severe pain., Disp: 12 tablet, Rfl: 0   senna-docusate (SENOKOT-S) 8.6-50 MG tablet, Take 2 tablets by mouth at bedtime. For AFTER surgery, do not take if having diarrhea, Disp: 30 tablet, Rfl: 0   traZODone (DESYREL) 50 MG tablet, Take 1 tablet by mouth at bedtime., Disp: , Rfl:   Review of Symptoms: Complete 10-system review is negative except as above in Interval History.  Physical Exam: BP 133/89 (BP Location: Right Arm, Patient Position: Sitting)   Pulse 99   Temp 98.6 F (37 C) (Oral)   Resp 18   Wt 155 lb 12.8 oz (70.7 kg)   SpO2 100%   BMI 27.61 kg/m  General: Alert, oriented, no acute distress. HEENT: Normocephalic, atraumatic. Neck symmetric without masses. Sclera anicteric.  Chest: Normal work of breathing. Clear to auscultation bilaterally.   Cardiovascular: Regular rate and rhythm, no murmurs. Abdomen: Soft, nontender.  Normoactive bowel sounds.  No masses appreciated.  Well healed midline incision. Extremities: Grossly normal range of motion.  Warm, well perfused.  No edema bilaterally. Skin: No rashes or lesions noted. CV: no cervical, supraclavicular or inguinal LAD GU: Normal appearing external genitalia without erythema, excoriation, or lesions.  Speculum exam reveals normal vaginal mucosa overall but with a subcentimeter pink polypoid lesion  from the right vaginal apex, possible polyp of granulation tissue.  Bimanual exam reveals smooth vaginal cuff without nodularity apart from the one area of the polypoid tissue at the right apex; no pelvic mass. Exam chaperoned by Kimberly [place], CMA   VAGINAL BIOPSY  Alison Cervantes is a 42 y.o. woman who presents today for a vaginal biopsy, see details of the visit above.  The procedure was explained to the patient and verbal consent was obtained prior to the procedure.  The vaginal mucosa was cleaned with Betadine.  0.5 ml of 1% lidocaine was injected at the planned biopsy site.  A tischler biopsy forcep was used to obtain the biopsy specimen, which essentially removed the polypoid tissue in its entirety.  Hemostasis was achieved with silver nitrate.  Patient tolerated the procedure well.   Laboratory & Radiologic Studies: None

## 2022-10-28 ENCOUNTER — Telehealth: Payer: Self-pay

## 2022-10-28 LAB — SURGICAL PATHOLOGY

## 2022-10-28 NOTE — Telephone Encounter (Signed)
Per Warner Mccreedy NP, PET scan has been authorized with insurance.  PET scheduled for 11/05/22 @ 6:30 with arrival time of 6:00.  Voicemail left for patient to call office to make her aware of appointment.

## 2022-10-28 NOTE — Telephone Encounter (Signed)
Pt is aware of the PET scan and agrees to date/time.  While on the phone, pt is asking for result of the biopsy taken on Monday by Dr. Alvester Morin.

## 2022-11-05 ENCOUNTER — Encounter (HOSPITAL_COMMUNITY)
Admission: RE | Admit: 2022-11-05 | Discharge: 2022-11-05 | Disposition: A | Discharge status: Home / Self Care | Payer: 59 | Source: Ambulatory Visit | Attending: Psychiatry | Admitting: Psychiatry

## 2022-11-05 DIAGNOSIS — C531 Malignant neoplasm of exocervix: Secondary | ICD-10-CM | POA: Diagnosis present

## 2022-11-05 LAB — GLUCOSE, CAPILLARY: Glucose-Capillary: 86 mg/dL (ref 70–99)

## 2022-11-05 MED ORDER — FLUDEOXYGLUCOSE F - 18 (FDG) INJECTION
7.8000 | Freq: Once | INTRAVENOUS | Status: AC
  Administered 2022-11-05: 7.8 via INTRAVENOUS

## 2022-11-16 ENCOUNTER — Encounter: Payer: Self-pay | Admitting: Psychiatry

## 2022-11-16 ENCOUNTER — Other Ambulatory Visit: Payer: Self-pay | Admitting: Psychiatry

## 2022-11-16 DIAGNOSIS — C531 Malignant neoplasm of exocervix: Secondary | ICD-10-CM

## 2022-11-16 DIAGNOSIS — N838 Other noninflammatory disorders of ovary, fallopian tube and broad ligament: Secondary | ICD-10-CM

## 2022-11-16 NOTE — Progress Notes (Signed)
Called pt regarding PET scan results. She is premenopausal and has functional ovaries. Discussed that hypermetabolism from an ovary can often be benign in a premenopausal woman. Also, SCC does not typically spread to ovaries. That being said, we will follow it up with a pelvic ultrasound. Plan follow-up with me after ultrasound.

## 2022-11-20 ENCOUNTER — Ambulatory Visit (HOSPITAL_COMMUNITY)
Admission: RE | Admit: 2022-11-20 | Discharge: 2022-11-20 | Disposition: A | Discharge status: Home / Self Care | Payer: 59 | Source: Ambulatory Visit | Attending: Psychiatry | Admitting: Psychiatry

## 2022-11-20 DIAGNOSIS — C531 Malignant neoplasm of exocervix: Secondary | ICD-10-CM | POA: Insufficient documentation

## 2022-11-20 DIAGNOSIS — N838 Other noninflammatory disorders of ovary, fallopian tube and broad ligament: Secondary | ICD-10-CM | POA: Insufficient documentation

## 2022-11-24 ENCOUNTER — Encounter: Payer: Self-pay | Admitting: Psychiatry

## 2022-11-30 ENCOUNTER — Inpatient Hospital Stay: Payer: 59 | Attending: Psychiatry

## 2022-11-30 ENCOUNTER — Encounter: Payer: Self-pay | Admitting: Psychiatry

## 2022-11-30 ENCOUNTER — Inpatient Hospital Stay (HOSPITAL_BASED_OUTPATIENT_CLINIC_OR_DEPARTMENT_OTHER): Payer: 59 | Admitting: Gynecologic Oncology

## 2022-11-30 ENCOUNTER — Other Ambulatory Visit: Payer: Self-pay | Admitting: Family Medicine

## 2022-11-30 ENCOUNTER — Inpatient Hospital Stay (HOSPITAL_BASED_OUTPATIENT_CLINIC_OR_DEPARTMENT_OTHER): Payer: 59 | Admitting: Psychiatry

## 2022-11-30 VITALS — BP 119/81 | HR 80 | Temp 98.3°F | Resp 17 | Ht 62.99 in | Wt 152.0 lb

## 2022-11-30 DIAGNOSIS — Z8541 Personal history of malignant neoplasm of cervix uteri: Secondary | ICD-10-CM | POA: Diagnosis present

## 2022-11-30 DIAGNOSIS — Z90721 Acquired absence of ovaries, unilateral: Secondary | ICD-10-CM | POA: Diagnosis not present

## 2022-11-30 DIAGNOSIS — N838 Other noninflammatory disorders of ovary, fallopian tube and broad ligament: Secondary | ICD-10-CM

## 2022-11-30 DIAGNOSIS — C531 Malignant neoplasm of exocervix: Secondary | ICD-10-CM

## 2022-11-30 DIAGNOSIS — Z7189 Other specified counseling: Secondary | ICD-10-CM

## 2022-11-30 DIAGNOSIS — Z90710 Acquired absence of both cervix and uterus: Secondary | ICD-10-CM | POA: Insufficient documentation

## 2022-11-30 DIAGNOSIS — Z9079 Acquired absence of other genital organ(s): Secondary | ICD-10-CM | POA: Diagnosis not present

## 2022-11-30 LAB — LACTATE DEHYDROGENASE: LDH: 153 U/L (ref 98–192)

## 2022-11-30 MED ORDER — SENNOSIDES-DOCUSATE SODIUM 8.6-50 MG PO TABS
2.0000 | ORAL_TABLET | Freq: Every day | ORAL | 0 refills | Status: DC
Start: 2022-11-30 — End: 2023-05-07

## 2022-11-30 MED ORDER — OXYCODONE HCL 5 MG PO TABS
5.0000 mg | ORAL_TABLET | ORAL | 0 refills | Status: DC | PRN
Start: 2022-11-30 — End: 2023-05-07

## 2022-11-30 NOTE — Patient Instructions (Addendum)
Preparing for your Surgery  Plan for surgery on December 29, 2022 with Dr. Clide Cliff at [facility]. You will be scheduled for robotic assisted laparoscopic unilateral salpingo-oophorectomy (removal of an ovary and fallopian tube), possible bilateral salpingo-oophorectomy (possible removal of both ovaries and fallopian tubes), possible staging if a cancer or precancer, possible mini laparotomy.   Pre-operative Testing -You will receive a phone call from presurgical testing at [facility] to arrange for a pre-operative appointment and lab work.  -Bring your insurance card, copy of an advanced directive if applicable, medication list  -At that visit, you will be asked to sign a consent for a possible blood transfusion in case a transfusion becomes necessary during surgery.  The need for a blood transfusion is rare but having consent is a necessary part of your care.     -You should not be taking blood thinners or aspirin at least ten days prior to surgery unless instructed by your surgeon.  -Do not take supplements such as fish oil (omega 3), red yeast rice, turmeric before your surgery. You want to avoid medications with aspirin in them including headache powders such as BC or Goody's), Excedrin migraine. STOP FISH OIL AT LEAST 10 DAYS BEFORE SURGERY.  Day Before Surgery at Home -You will be asked to take in a light diet the day before surgery. You will be advised you can have clear liquids up until 3 hours before your surgery.    Eat a light diet the day before surgery.  Examples including soups, broths, toast, yogurt, mashed potatoes.  AVOID GAS PRODUCING FOODS AND BEVERAGES. Things to avoid include carbonated beverages (fizzy beverages, sodas), raw fruits and raw vegetables (uncooked), or beans.   If your bowels are filled with gas, your surgeon will have difficulty visualizing your pelvic organs which increases your surgical risks.  Your role in recovery Your role is  to become active as soon as directed by your doctor, while still giving yourself time to heal.  Rest when you feel tired. You will be asked to do the following in order to speed your recovery:  - Cough and breathe deeply. This helps to clear and expand your lungs and can prevent pneumonia after surgery.  - STAY ACTIVE WHEN YOU GET HOME. Do mild physical activity. Walking or moving your legs help your circulation and body functions return to normal. Do not try to get up or walk alone the first time after surgery.   -If you develop swelling on one leg or the other, pain in the back of your leg, redness/warmth in one of your legs, please call the office or go to the Emergency Room to have a doppler to rule out a blood clot. For shortness of breath, chest pain-seek care in the Emergency Room as soon as possible. - Actively manage your pain. Managing your pain lets you move in comfort. We will ask you to rate your pain on a scale of zero to 10. It is your responsibility to tell your doctor or nurse where and how much you hurt so your pain can be treated.  Special Considerations -If you are diabetic, you may be placed on insulin after surgery to have closer control over your blood sugars to promote healing and recovery.  This does not mean that you will be discharged on insulin.  If applicable, your oral antidiabetics will be resumed when you are tolerating a solid diet.  -Your final pathology results from surgery should be available around one  week after surgery and the results will be relayed to you when available.  -Dr. Antionette Char is the surgeon that assists your GYN Oncologist with surgery.  If you end up staying the night, the next day after your surgery you will either see Dr. Pricilla Holm, Dr. Alvester Morin, or Dr. Antionette Char.  -FMLA forms can be faxed to [phone] and please allow 5-7 business days for completion.  Pain Management After Surgery -You will be prescribed your pain medication  and bowel regimen medications before surgery so that you can have these available when you are discharged from the hospital. The pain medication is for use ONLY AFTER surgery and a new prescription will not be given.   -Make sure that you have Tylenol and Ibuprofen IF YOU ARE ABLE TO TAKE THESE MEDICATIONS at home to use on a regular basis after surgery for pain control. We recommend alternating the medications every hour to six hours since they work differently and are processed in the body differently for pain relief.  -Review the attached handout on narcotic use and their risks and side effects.   Bowel Regimen -You will be prescribed Sennakot-S to take nightly to prevent constipation especially if you are taking the narcotic pain medication intermittently.  It is important to prevent constipation and drink adequate amounts of liquids. You can stop taking this medication when you are not taking pain medication and you are back on your normal bowel routine.  Risks of Surgery Risks of surgery are low but include bleeding, infection, damage to surrounding structures, re-operation, blood clots, and very rarely death.   Blood Transfusion Information (For the consent to be signed before surgery)  We will be checking your blood type before surgery so in case of emergencies, we will know what type of blood you would need.                                            WHAT IS A BLOOD TRANSFUSION?  A transfusion is the replacement of blood or some of its parts. Blood is made up of multiple cells which provide different functions. Red blood cells carry oxygen and are used for blood loss replacement. White blood cells fight against infection. Platelets control bleeding. Plasma helps clot blood. Other blood products are available for specialized needs, such as hemophilia or other clotting disorders. BEFORE THE TRANSFUSION  Who gives blood for transfusions?  You may be able to donate blood to be used at  a later date on yourself (autologous donation). Relatives can be asked to donate blood. This is generally not any safer than if you have received blood from a stranger. The same precautions are taken to ensure safety when a relative's blood is donated. Healthy volunteers who are fully evaluated to make sure their blood is safe. This is blood bank blood. Transfusion therapy is the safest it has ever been in the practice of medicine. Before blood is taken from a donor, a complete history is taken to make sure that person has no history of diseases nor engages in risky social behavior (examples are intravenous drug use or sexual activity with multiple partners). The donor's travel history is screened to minimize risk of transmitting infections, such as malaria. The donated blood is tested for signs of infectious diseases, such as HIV and hepatitis. The blood is then tested to be sure it is compatible with  you in order to minimize the chance of a transfusion reaction. If you or a relative donates blood, this is often done in anticipation of surgery and is not appropriate for emergency situations. It takes many days to process the donated blood. RISKS AND COMPLICATIONS Although transfusion therapy is very safe and saves many lives, the main dangers of transfusion include:  Getting an infectious disease. Developing a transfusion reaction. This is an allergic reaction to something in the blood you were given. Every precaution is taken to prevent this. The decision to have a blood transfusion has been considered carefully by your caregiver before blood is given. Blood is not given unless the benefits outweigh the risks.  AFTER SURGERY INSTRUCTIONS  Return to work: 4-6 weeks if applicable  Activity: 1. Be up and out of the bed during the day.  Take a nap if needed.  You may walk up steps but be careful and use the hand rail.  Stair climbing will tire you more than you think, you may need to stop part way and  rest.   2. No lifting or straining for 6 weeks over 10 pounds. No pushing, pulling, straining for 6 weeks.  3. No driving for around 1 week(s).  Do not drive if you are taking narcotic pain medicine and make sure that your reaction time has returned.   4. You can shower as soon as the next day after surgery. Shower daily.  Use your regular soap and water (not directly on the incision) and pat your incision(s) dry afterwards; don't rub.  No tub baths or submerging your body in water until cleared by your surgeon. If you have the soap that was given to you by pre-surgical testing that was used before surgery, you do not need to use it afterwards because this can irritate your incisions.   5. No sexual activity and nothing in the vagina for 4-6 weeks.  6. You may experience a small amount of clear drainage from your incisions, which is normal.  If the drainage persists, increases, or changes color please call the office.  7. Do not use creams, lotions, or ointments such as neosporin on your incisions after surgery until advised by your surgeon because they can cause removal of the dermabond glue on your incisions.    8. You may experience vaginal spotting after surgery.  The spotting is normal but if you experience heavy bleeding, call our office.  9. Take Tylenol or ibuprofen first for pain if you are able to take these medications and only use narcotic pain medication for severe pain not relieved by the Tylenol or Ibuprofen.  Monitor your Tylenol intake to a max of 4,000 mg in a 24 hour period. You can alternate these medications after surgery.  Diet: 1. Low sodium Heart Healthy Diet is recommended but you are cleared to resume your normal (before surgery) diet after your procedure.  2. It is safe to use a laxative, such as Miralax or Colace, if you have difficulty moving your bowels. You have been prescribed Sennakot-S to take at bedtime every evening after surgery to keep bowel movements  regular and to prevent constipation.    Wound Care: 1. Keep clean and dry.  Shower daily.  Reasons to call the Doctor: Fever - Oral temperature greater than 100.4 degrees Fahrenheit Foul-smelling vaginal discharge Difficulty urinating Nausea and vomiting Increased pain at the site of the incision that is unrelieved with pain medicine. Difficulty breathing with or without chest pain New calf  pain especially if only on one side Sudden, continuing increased vaginal bleeding with or without clots.   Contacts: For questions or concerns you should contact:  Dr. Clide Cliff at [phone]  Warner Mccreedy, NP at [phone]  After Hours: call [phone] and have the GYN Oncologist paged/contacted (after 5 pm or on the weekends). You will speak with an after hours RN and let he or she know you have had surgery.  Messages sent via mychart are for non-urgent matters and are not responded to after hours so for urgent needs, please call the after hours number.

## 2022-11-30 NOTE — H&P (View-Only) (Signed)
Gynecologic Oncology Return Clinic Visit  Date of Service: 11/30/2022 Referring Provider: Derl Barrow, MD    Assessment & Plan: Alison Cervantes is a 42 y.o. 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  Ovarian lesion: Reviewed possible etiologies of ovarian lesion.  In a premenopausal, ovulating individual, benign ovarian lesions can be hypermetabolic on PET.  Had recommended pelvic ultrasound to follow-up this finding.  I have reviewed these images.  It appears that the adnexal lesion was only really identified on the transabdominal images, possibly because her ovaries are pexied.  Per the report, the lesion is predominantly anechoic with thin septations.  These are consistent with benign features.  On my review of the images, the evaluation of this ovary is somewhat limited by transabdominal approach.  Reviewed with patient that neck steps could include further evaluation with MRI versus surgical evaluation and definitive diagnosis.  Reviewed options for ovarian cystectomy versus oophorectomy.  Following risk/benefits discussion, patient wishes to proceed with definitive surgical evaluation and management with oophorectomy.  Patient was consented for: Robotic assisted unilateral salpingectomy oophorectomy, possible bilateral salpingo-oophorectomy, possible staging, possible mini laparotomy on 12/29/22.  In the event of malignancy or borderline tumor on frozen section, we will perform indicated staging procedures. We discussed that these procedures may include omentectomy pelvic and/or para-aortic lymphadenectomy, peritoneal biopsies. We would also remove any tissue  concerning for metastatic disease which could require additional procedures including bowel surgery.  The risks of surgery were discussed in detail and she understands these to including but not limited to bleeding requiring a blood transfusion, infection, injury to adjacent organs (including but not limited to the bowels, bladder, ureters, nerves, blood vessels), thromboembolic events, wound separation, hernia, possible risk of lymphedema and lymphocyst if lymphadenectomy performed, unforseen complication, and possible need for re-exploration.  If the patient experiences any of these events, she understands that her hospitalization or recovery may be prolonged and that she may need to take additional medications for a prolonged period. The patient will receive DVT and antibiotic prophylaxis as indicated. She voiced a clear understanding. She had the opportunity to ask questions and informed consent was obtained today. She wishes to proceed.  She will proceed to the lab today for ovarian tumor markers as we discussed that an ovarian that of squamous cell carcinoma is rare, so would workup for additional ovarian etiologies as well. She does not require preoperative clearance. Her METs are >4.  All preoperative instructions were reviewed. Postoperative expectations were also reviewed. Written handouts were provided to the patient.   SCC of cervix: - Pt was previously counseled on risk factors for recurrence that are somewhat indeterminate based on pathology combined from cone specimen and hysterectomy specimen. Following consultation with radiation oncology, pt has opted to forgo adjuvant treatment.  -PET with hypermetabolic ovarian lesion as above. - Exam otherwise benign. - If surgery as above is negative for recurrence of SCC, continue recommended surveillance q60mo x2 years, then q6mo. - Plan annual pap.  RTC postop  Clide Cliff, MD Gynecologic Oncology   Medical Decision Making I  personally spent  TOTAL 45 minutes face-to-face and non-face-to-face in the care of this patient, which includes all pre, intra, and post visit time on the date of service.   -----------------------  Reason for Visit: Follow-up, adnexal lesion  Treatment History: Oncology History  Malignant neoplasm of exocervix (HCC)  10/20/2021 Pathology Results   ASCUS, HPV16+   01/27/2022 Procedure   Colposcopy: Cervical biopsy at 5:00: Invasive squamous cell carcinoma, HPV-associated Cervical biopsy 11:00: HSIL Cervical biopsy 12:00 H SIL ECC: HSIL   01/27/2022 Initial Diagnosis   Malignant neoplasm of exocervix (HCC)   02/24/2022 Surgery   [facility]   02/24/2022 Pathology Results   A. CERVIX, CONE:  - Invasive moderately differentiated squamous cell carcinoma associated  with extensive squamous cell carcinoma in situ  - Invasive carcinoma is 2 cm and invades to a depth of 0.6 cm (6 mm)  - Invasive carcinoma involves the ectocervical and deep margins in the  12-3 o'clock quadrant  - Carcinoma in situ involves the endocervical and deep margins in the  6-9 o'clock quadrant  - Focal lymphovascular space involvement  - See oncology table   B. ENDOCERVIX, CURETTAGE:  - Detached fragments of at least squamous cell carcinoma in situ  - Benign endocervical mucosa    03/23/2022 Imaging   PET: IMPRESSION: 1. Focal FDG uptake in the posterior cervix likely the site of reported neoplasm. 2. No signs of distant metastatic disease. 3. Signs of brown fat activation in the paraspinal regions and in the posterior mediastinum.  ADDENDUM: Revised impression: Cervical activity remains suspicious though in light of recent surgery is less specific for residual disease.   03/23/2022 Imaging   MRI Pelvis: IMPRESSION: Some susceptibility artifact in the area of the cervix may relate to recent surgical change. Mild irregularity at the 6:00 to 9:00 position in the lower cervix and anteriorly along the  lower cervical margin without gross enhancing lesion, or signs of parametrial extension.   No adenopathy in the pelvis.   Above findings are limited however along the LEFT cervical margin due to susceptibility artifact from rectal gas. Would suggest the patient return for repeat imaging after restricting diet of large meals, high-fiber and caffeine or carbonated beverages 24 hours prior to scanning.  ADDENDUM: Limitations are as outlined previously. The previously reported "RIGHT posterolateral" abnormality is actually LEFT paramidline posterior cervix, nearly in the midline.   Intermediate T2 signal along the margin near the anterior cervical os is centered more in the midline. Bowel gas limits assessment on both diffusion and postcontrast imaging.     Interval History: Presents today with her partner.  No new symptoms.  Anxious about PET results with adnexal lesion.   Past Medical/Surgical History: Past Medical History:  Diagnosis Date   ADHD (attention deficit hyperactivity disorder)    Cervical cancer (HCC)    Depression    Family history of breast cancer    Family history of colon cancer    Family history of melanoma    Family history of ovarian cancer    Family history of prostate cancer    Family history of uterine cancer    History of anemia    History of migraine    History of peptic ulcer disease    Psoriasis    Vitamin D deficiency    Wears glasses     Past Surgical History:  Procedure Laterality Date   CERVICAL CONIZATION W/BX N/A 02/24/2022   Procedure: CONIZATION CERVIX WITH BIOPSY;  Surgeon: Clide Cliff, MD;  Location: [facility];  Service: Gynecology;  Laterality: N/A;   NO PAST SURGERIES      Family History  Problem Relation Age of Onset   Hyperlipidemia Mother  Hypertension Mother    Melanoma Mother    Alcohol abuse Father    Heart disease Father    Hyperlipidemia Father    Hypertension Father    Diabetes Maternal  Grandmother    Heart disease Maternal Grandmother    Hyperlipidemia Maternal Grandmother    Hypertension Maternal Grandmother    Breast cancer Maternal Grandmother    Hyperlipidemia Paternal Grandmother    Hypertension Paternal Grandmother    Leukemia Paternal Grandmother    Brain cancer Paternal Grandmother    Prostate cancer Paternal Grandfather    Diabetes Paternal Grandfather    Hyperlipidemia Paternal Grandfather    Hypertension Paternal Grandfather    Colon cancer Paternal Grandfather    Stomach cancer Paternal Grandfather    Breast cancer Maternal Aunt    Uterine cancer Maternal Aunt    Ovarian cancer Maternal Aunt    Uterine cancer Cousin 82       maternal first cousin   Breast cancer Cousin        maternal cousin with breast cancer in her 90s   Alcohol abuse Other        family hx   Breast cancer Other        1st degree family <50   Coronary artery disease Other        1st degree ><60   Diabetes Other        family hx   Hypertension Other        family hx   Lung cancer Other        family hx    Social History   Socioeconomic History   Marital status: Significant Other    Spouse name: Not on file   Number of children: Not on file   Years of education: Not on file   Highest education level: Not on file  Occupational History   Not on file  Tobacco Use   Smoking status: Never   Smokeless tobacco: Never  Vaping Use   Vaping status: Never Used  Substance and Sexual Activity   Alcohol use: Not Currently   Drug use: Not Currently    Comment: 02-20-2022  per pt smoked marijuana few months ago   Sexual activity: Not on file  Other Topics Concern   Not on file  Social History Narrative   Not on file   Social Determinants of Health   Financial Resource Strain: Not on file  Food Insecurity: Not on file  Transportation Needs: No Transportation Needs (02/18/2022)   PRAPARE - Administrator, Civil Service (Medical): No    Lack of Transportation  (Non-Medical): No  Physical Activity: Not on file  Stress: Not on file  Social Connections: Not on file    Current Medications:  Current Outpatient Medications:    amphetamine-dextroamphetamine (ADDERALL) 30 MG tablet, Take 1 tablet by mouth 2 (two) times daily., Disp: 60 tablet, Rfl: 0   Cholecalciferol (VITAMIN D-3 PO), Take 1 capsule by mouth daily., Disp: , Rfl:    cyanocobalamin (VITAMIN B12) 1000 MCG tablet, Take 1,000 mcg by mouth daily., Disp: , Rfl:    hydrocortisone cream 1 %, Apply 1 Application topically 2 (two) times daily as needed for itching., Disp: , Rfl:    loratadine (CLARITIN REDITABS) 10 MG dissolvable tablet, Take 10 mg by mouth daily., Disp: , Rfl:    Multiple Vitamin (MULTIVITAMIN WITH MINERALS) TABS tablet, Take 1 tablet by mouth daily., Disp: , Rfl:    Omega-3 Fatty Acids (FISH OIL PO),  Take 1-2 capsules by mouth daily., Disp: , Rfl:    oxyCODONE (OXY IR/ROXICODONE) 5 MG immediate release tablet, Take 1 tablet (5 mg total) by mouth every 4 (four) hours as needed for severe pain. For AFTER surgery only, do not take and drive, Disp: 15 tablet, Rfl: 0   senna-docusate (SENOKOT-S) 8.6-50 MG tablet, Take 2 tablets by mouth at bedtime. For AFTER surgery, do not take if having diarrhea, Disp: 30 tablet, Rfl: 0  Review of Symptoms: Complete 10-system review is negative except as above in Interval History.  Physical Exam: BP 119/81 (BP Location: Right Arm, Patient Position: Sitting)   Pulse 80   Temp 98.3 F (36.8 C) (Oral)   Resp 17   Ht 5' 2.99" (1.6 m)   Wt 152 lb (68.9 kg)   SpO2 100%   BMI 26.93 kg/m  General: Alert, oriented, no acute distress. HEENT: Normocephalic, atraumatic. Neck symmetric without masses. Sclera anicteric.  Chest: Normal work of breathing. Clear to auscultation bilaterally.   Cardiovascular: Regular rate and rhythm, no murmurs. Abdomen: Soft, nontender.  Normoactive bowel sounds.   Extremities: Grossly normal range of motion.  Warm,  well perfused.  No edema bilaterally. Skin: No rashes or lesions noted. GU: Normal appearing external genitalia without erythema, excoriation, or lesions.  Speculum exam reveals normal vaginal mucosa.  Bimanual exam reveals smooth vaginal cuff without nodularity; no pelvic mass. Exam chaperoned by Kimberly [place], CMA    Laboratory & Radiologic Studies: Surgical pathology (10/26/22): FINAL MICROSCOPIC DIAGNOSIS:   A. VAGINAL, RIGHT APEX, BIOPSY:       Granulation tissue with robust acute and chronic inflammation.       Largely mucosal denudation without sufficient mucosa present for  evaluation.      Negative for malignancy.   US PELVIC COMPLETE WITH TRANSVAGINAL 11/20/2022  Narrative CLINICAL DATA:  New area of focal hypermetabolism in the left ovary on a recent PET-CT scan for cervical cancer. Previous hysterectomy, bilateral salpingectomies and bilateral ovarian transposition.  EXAM: TRANSABDOMINAL AND TRANSVAGINAL ULTRASOUND OF PELVIS  TECHNIQUE: Both transabdominal and transvaginal ultrasound examinations of the pelvis were performed. Transabdominal technique was performed for global imaging of the pelvis including uterus, ovaries, adnexal regions, and pelvic cul-de-sac. It was necessary to proceed with endovaginal exam following the transabdominal exam to visualize the ovaries in better detail.  COMPARISON:  PET-CT dated 11/05/2022  FINDINGS: Uterus  Surgically absent.  Right ovary  Measurements: 2.9 x 2.5 x 1.5 cm = volume: 5.6 mL. Only seen transabdominally. Normal appearance/no adnexal mass.  Left ovary  Measurements: 2.7 x 2.5 x 1.9 cm = volume: 6.8 mL. Only seen transabdominally. 1.7 x 1.7 x 1.3 cm oval left ovarian mass. This is predominantly anechoic with multiple thin internal septations. No internal blood flow seen with color Doppler. The size, shape and location in the ovary are compatible with the area of focal hypermetabolism seen on the recent  PET-CT.  Other findings  No abnormal free fluid.  IMPRESSION: 1.7 cm complicated left ovarian cystic mass. This has probably benign features sonographically. However, the associated hypermetabolism on the recent PET-CT is concerning for a small primary or metastatic ovarian neoplasm.   Electronically Signed By: Beckie Salts M.D. On: 11/25/2022 14:03   NM PET Image Restage (PS) Skull Base to Thigh (F-18 FDG) 11/05/2022  Narrative CLINICAL DATA:  Subsequent treatment strategy for cervical cancer.  EXAM: NUCLEAR MEDICINE PET SKULL BASE TO THIGH  TECHNIQUE: 7.8 mCi F-18 FDG was injected intravenously. Full-ring PET imaging was  performed from the skull base to thigh after the radiotracer. CT data was obtained and used for attenuation correction and anatomic localization.  Fasting blood glucose: 86 mg/dl  COMPARISON:  81/19/1478.  FINDINGS: Mediastinal blood pool activity: SUV max 1.8  Liver activity: SUV max NA  NECK:  No abnormal hypermetabolism.  Incidental CT findings:  None.  CHEST:  No abnormal hypermetabolism.  Incidental CT findings:  Heart is mildly enlarged.  No pericardial or pleural effusion.  ABDOMEN/PELVIS:  New focal hypermetabolism associated with the left ovary, SUV max 8.6. No additional abnormal hypermetabolism.  Incidental CT findings:  Liver, gallbladder, adrenal glands, kidneys, spleen, pancreas, stomach and bowel are grossly unremarkable. Interval hysterectomy, bilateral salpingectomies and bilateral ovarian transposition.  SKELETON:  No abnormal hypermetabolism.  Incidental CT findings:  Degenerative changes in the spine.  IMPRESSION: 1. New left ovarian hypermetabolism, worrisome for metastatic disease. 2. Interval hysterectomy, bilateral salpingectomies and bilateral ovarian transposition.   Electronically Signed By: Leanna Battles M.D. On: 11/16/2022 08:31

## 2022-11-30 NOTE — Progress Notes (Signed)
Patient here for a pre-operative appointment prior to her scheduled surgery on 12/29/2022. She is scheduled for a robotic assisted laparoscopic unilateral salpingo-oophorectomy, possible bilateral salpingo-oophorectomy, possible staging if a cancer or precancer, possible mini laparotomy. The surgery was discussed in detail.  See after visit summary for additional details.    Discussed post-op pain management in detail including the aspects of the enhanced recovery pathway.  Advised her that a new prescription would be sent in for Oxycodone and it is only to be used for after her upcoming surgery.  We discussed the use of tylenol post-op and to monitor for a maximum of 4,000 mg in a 24 hour period.  Also prescribed sennakot to be used after surgery and to hold if having loose stools.  Discussed bowel regimen in detail.     Discussed the use of SCDs and measures to take at home to prevent DVT including frequent mobility.  Reportable signs and symptoms of DVT discussed. Post-operative instructions discussed and expectations for after surgery. Incisional care discussed as well including reportable signs and symptoms including erythema, drainage, wound separation.     30 minutes spent with the patient.  Verbalizing understanding of material discussed. No needs or concerns voiced at the end of the visit.   Advised patient to call for any needs.  Advised that her post-operative medications had been prescribed and could be picked up at any time.    This appointment is included in the global surgical bundle as pre-operative teaching and has no charge.

## 2022-11-30 NOTE — Progress Notes (Signed)
Gynecologic Oncology Return Clinic Visit  Date of Service: 11/30/2022 Referring Provider: Derl Barrow, MD    Assessment & Plan: Alison Cervantes is a 42 y.o. 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  Ovarian lesion: Reviewed possible etiologies of ovarian lesion.  In a premenopausal, ovulating individual, benign ovarian lesions can be hypermetabolic on PET.  Had recommended pelvic ultrasound to follow-up this finding.  I have reviewed these images.  It appears that the adnexal lesion was only really identified on the transabdominal images, possibly because her ovaries are pexied.  Per the report, the lesion is predominantly anechoic with thin septations.  These are consistent with benign features.  On my review of the images, the evaluation of this ovary is somewhat limited by transabdominal approach.  Reviewed with patient that neck steps could include further evaluation with MRI versus surgical evaluation and definitive diagnosis.  Reviewed options for ovarian cystectomy versus oophorectomy.  Following risk/benefits discussion, patient wishes to proceed with definitive surgical evaluation and management with oophorectomy.  Patient was consented for: Robotic assisted unilateral salpingectomy oophorectomy, possible bilateral salpingo-oophorectomy, possible staging, possible mini laparotomy on 12/29/22.  In the event of malignancy or borderline tumor on frozen section, we will perform indicated staging procedures. We discussed that these procedures may include omentectomy pelvic and/or para-aortic lymphadenectomy, peritoneal biopsies. We would also remove any tissue  concerning for metastatic disease which could require additional procedures including bowel surgery.  The risks of surgery were discussed in detail and she understands these to including but not limited to bleeding requiring a blood transfusion, infection, injury to adjacent organs (including but not limited to the bowels, bladder, ureters, nerves, blood vessels), thromboembolic events, wound separation, hernia, possible risk of lymphedema and lymphocyst if lymphadenectomy performed, unforseen complication, and possible need for re-exploration.  If the patient experiences any of these events, she understands that her hospitalization or recovery may be prolonged and that she may need to take additional medications for a prolonged period. The patient will receive DVT and antibiotic prophylaxis as indicated. She voiced a clear understanding. She had the opportunity to ask questions and informed consent was obtained today. She wishes to proceed.  She will proceed to the lab today for ovarian tumor markers as we discussed that an ovarian that of squamous cell carcinoma is rare, so would workup for additional ovarian etiologies as well. She does not require preoperative clearance. Her METs are >4.  All preoperative instructions were reviewed. Postoperative expectations were also reviewed. Written handouts were provided to the patient.   SCC of cervix: - Pt was previously counseled on risk factors for recurrence that are somewhat indeterminate based on pathology combined from cone specimen and hysterectomy specimen. Following consultation with radiation oncology, pt has opted to forgo adjuvant treatment.  -PET with hypermetabolic ovarian lesion as above. - Exam otherwise benign. - If surgery as above is negative for recurrence of SCC, continue recommended surveillance q60mo x2 years, then q6mo. - Plan annual pap.  RTC postop  Clide Cliff, MD Gynecologic Oncology   Medical Decision Making I  personally spent  TOTAL 45 minutes face-to-face and non-face-to-face in the care of this patient, which includes all pre, intra, and post visit time on the date of service.   -----------------------  Reason for Visit: Follow-up, adnexal lesion  Treatment History: Oncology History  Malignant neoplasm of exocervix (HCC)  10/20/2021 Pathology Results   ASCUS, HPV16+   01/27/2022 Procedure   Colposcopy: Cervical biopsy at 5:00: Invasive squamous cell carcinoma, HPV-associated Cervical biopsy 11:00: HSIL Cervical biopsy 12:00 H SIL ECC: HSIL   01/27/2022 Initial Diagnosis   Malignant neoplasm of exocervix (HCC)   02/24/2022 Surgery   [facility]   02/24/2022 Pathology Results   A. CERVIX, CONE:  - Invasive moderately differentiated squamous cell carcinoma associated  with extensive squamous cell carcinoma in situ  - Invasive carcinoma is 2 cm and invades to a depth of 0.6 cm (6 mm)  - Invasive carcinoma involves the ectocervical and deep margins in the  12-3 o'clock quadrant  - Carcinoma in situ involves the endocervical and deep margins in the  6-9 o'clock quadrant  - Focal lymphovascular space involvement  - See oncology table   B. ENDOCERVIX, CURETTAGE:  - Detached fragments of at least squamous cell carcinoma in situ  - Benign endocervical mucosa    03/23/2022 Imaging   PET: IMPRESSION: 1. Focal FDG uptake in the posterior cervix likely the site of reported neoplasm. 2. No signs of distant metastatic disease. 3. Signs of brown fat activation in the paraspinal regions and in the posterior mediastinum.  ADDENDUM: Revised impression: Cervical activity remains suspicious though in light of recent surgery is less specific for residual disease.   03/23/2022 Imaging   MRI Pelvis: IMPRESSION: Some susceptibility artifact in the area of the cervix may relate to recent surgical change. Mild irregularity at the 6:00 to 9:00 position in the lower cervix and anteriorly along the  lower cervical margin without gross enhancing lesion, or signs of parametrial extension.   No adenopathy in the pelvis.   Above findings are limited however along the LEFT cervical margin due to susceptibility artifact from rectal gas. Would suggest the patient return for repeat imaging after restricting diet of large meals, high-fiber and caffeine or carbonated beverages 24 hours prior to scanning.  ADDENDUM: Limitations are as outlined previously. The previously reported "RIGHT posterolateral" abnormality is actually LEFT paramidline posterior cervix, nearly in the midline.   Intermediate T2 signal along the margin near the anterior cervical os is centered more in the midline. Bowel gas limits assessment on both diffusion and postcontrast imaging.     Interval History: Presents today with her partner.  No new symptoms.  Anxious about PET results with adnexal lesion.   Past Medical/Surgical History: Past Medical History:  Diagnosis Date   ADHD (attention deficit hyperactivity disorder)    Cervical cancer (HCC)    Depression    Family history of breast cancer    Family history of colon cancer    Family history of melanoma    Family history of ovarian cancer    Family history of prostate cancer    Family history of uterine cancer    History of anemia    History of migraine    History of peptic ulcer disease    Psoriasis    Vitamin D deficiency    Wears glasses     Past Surgical History:  Procedure Laterality Date   CERVICAL CONIZATION W/BX N/A 02/24/2022   Procedure: CONIZATION CERVIX WITH BIOPSY;  Surgeon: Clide Cliff, MD;  Location: [facility];  Service: Gynecology;  Laterality: N/A;   NO PAST SURGERIES      Family History  Problem Relation Age of Onset   Hyperlipidemia Mother  Hypertension Mother    Melanoma Mother    Alcohol abuse Father    Heart disease Father    Hyperlipidemia Father    Hypertension Father    Diabetes Maternal  Grandmother    Heart disease Maternal Grandmother    Hyperlipidemia Maternal Grandmother    Hypertension Maternal Grandmother    Breast cancer Maternal Grandmother    Hyperlipidemia Paternal Grandmother    Hypertension Paternal Grandmother    Leukemia Paternal Grandmother    Brain cancer Paternal Grandmother    Prostate cancer Paternal Grandfather    Diabetes Paternal Grandfather    Hyperlipidemia Paternal Grandfather    Hypertension Paternal Grandfather    Colon cancer Paternal Grandfather    Stomach cancer Paternal Grandfather    Breast cancer Maternal Aunt    Uterine cancer Maternal Aunt    Ovarian cancer Maternal Aunt    Uterine cancer Cousin 82       maternal first cousin   Breast cancer Cousin        maternal cousin with breast cancer in her 90s   Alcohol abuse Other        family hx   Breast cancer Other        1st degree family <50   Coronary artery disease Other        1st degree ><60   Diabetes Other        family hx   Hypertension Other        family hx   Lung cancer Other        family hx    Social History   Socioeconomic History   Marital status: Significant Other    Spouse name: Not on file   Number of children: Not on file   Years of education: Not on file   Highest education level: Not on file  Occupational History   Not on file  Tobacco Use   Smoking status: Never   Smokeless tobacco: Never  Vaping Use   Vaping status: Never Used  Substance and Sexual Activity   Alcohol use: Not Currently   Drug use: Not Currently    Comment: 02-20-2022  per pt smoked marijuana few months ago   Sexual activity: Not on file  Other Topics Concern   Not on file  Social History Narrative   Not on file   Social Determinants of Health   Financial Resource Strain: Not on file  Food Insecurity: Not on file  Transportation Needs: No Transportation Needs (02/18/2022)   PRAPARE - Administrator, Civil Service (Medical): No    Lack of Transportation  (Non-Medical): No  Physical Activity: Not on file  Stress: Not on file  Social Connections: Not on file    Current Medications:  Current Outpatient Medications:    amphetamine-dextroamphetamine (ADDERALL) 30 MG tablet, Take 1 tablet by mouth 2 (two) times daily., Disp: 60 tablet, Rfl: 0   Cholecalciferol (VITAMIN D-3 PO), Take 1 capsule by mouth daily., Disp: , Rfl:    cyanocobalamin (VITAMIN B12) 1000 MCG tablet, Take 1,000 mcg by mouth daily., Disp: , Rfl:    hydrocortisone cream 1 %, Apply 1 Application topically 2 (two) times daily as needed for itching., Disp: , Rfl:    loratadine (CLARITIN REDITABS) 10 MG dissolvable tablet, Take 10 mg by mouth daily., Disp: , Rfl:    Multiple Vitamin (MULTIVITAMIN WITH MINERALS) TABS tablet, Take 1 tablet by mouth daily., Disp: , Rfl:    Omega-3 Fatty Acids (FISH OIL PO),  Take 1-2 capsules by mouth daily., Disp: , Rfl:    oxyCODONE (OXY IR/ROXICODONE) 5 MG immediate release tablet, Take 1 tablet (5 mg total) by mouth every 4 (four) hours as needed for severe pain. For AFTER surgery only, do not take and drive, Disp: 15 tablet, Rfl: 0   senna-docusate (SENOKOT-S) 8.6-50 MG tablet, Take 2 tablets by mouth at bedtime. For AFTER surgery, do not take if having diarrhea, Disp: 30 tablet, Rfl: 0  Review of Symptoms: Complete 10-system review is negative except as above in Interval History.  Physical Exam: BP 119/81 (BP Location: Right Arm, Patient Position: Sitting)   Pulse 80   Temp 98.3 F (36.8 C) (Oral)   Resp 17   Ht 5' 2.99" (1.6 m)   Wt 152 lb (68.9 kg)   SpO2 100%   BMI 26.93 kg/m  General: Alert, oriented, no acute distress. HEENT: Normocephalic, atraumatic. Neck symmetric without masses. Sclera anicteric.  Chest: Normal work of breathing. Clear to auscultation bilaterally.   Cardiovascular: Regular rate and rhythm, no murmurs. Abdomen: Soft, nontender.  Normoactive bowel sounds.   Extremities: Grossly normal range of motion.  Warm,  well perfused.  No edema bilaterally. Skin: No rashes or lesions noted. GU: Normal appearing external genitalia without erythema, excoriation, or lesions.  Speculum exam reveals normal vaginal mucosa.  Bimanual exam reveals smooth vaginal cuff without nodularity; no pelvic mass. Exam chaperoned by Kimberly [place], CMA    Laboratory & Radiologic Studies: Surgical pathology (10/26/22): FINAL MICROSCOPIC DIAGNOSIS:   A. VAGINAL, RIGHT APEX, BIOPSY:       Granulation tissue with robust acute and chronic inflammation.       Largely mucosal denudation without sufficient mucosa present for  evaluation.      Negative for malignancy.   US PELVIC COMPLETE WITH TRANSVAGINAL 11/20/2022  Narrative CLINICAL DATA:  New area of focal hypermetabolism in the left ovary on a recent PET-CT scan for cervical cancer. Previous hysterectomy, bilateral salpingectomies and bilateral ovarian transposition.  EXAM: TRANSABDOMINAL AND TRANSVAGINAL ULTRASOUND OF PELVIS  TECHNIQUE: Both transabdominal and transvaginal ultrasound examinations of the pelvis were performed. Transabdominal technique was performed for global imaging of the pelvis including uterus, ovaries, adnexal regions, and pelvic cul-de-sac. It was necessary to proceed with endovaginal exam following the transabdominal exam to visualize the ovaries in better detail.  COMPARISON:  PET-CT dated 11/05/2022  FINDINGS: Uterus  Surgically absent.  Right ovary  Measurements: 2.9 x 2.5 x 1.5 cm = volume: 5.6 mL. Only seen transabdominally. Normal appearance/no adnexal mass.  Left ovary  Measurements: 2.7 x 2.5 x 1.9 cm = volume: 6.8 mL. Only seen transabdominally. 1.7 x 1.7 x 1.3 cm oval left ovarian mass. This is predominantly anechoic with multiple thin internal septations. No internal blood flow seen with color Doppler. The size, shape and location in the ovary are compatible with the area of focal hypermetabolism seen on the recent  PET-CT.  Other findings  No abnormal free fluid.  IMPRESSION: 1.7 cm complicated left ovarian cystic mass. This has probably benign features sonographically. However, the associated hypermetabolism on the recent PET-CT is concerning for a small primary or metastatic ovarian neoplasm.   Electronically Signed By: Beckie Salts M.D. On: 11/25/2022 14:03   NM PET Image Restage (PS) Skull Base to Thigh (F-18 FDG) 11/05/2022  Narrative CLINICAL DATA:  Subsequent treatment strategy for cervical cancer.  EXAM: NUCLEAR MEDICINE PET SKULL BASE TO THIGH  TECHNIQUE: 7.8 mCi F-18 FDG was injected intravenously. Full-ring PET imaging was  performed from the skull base to thigh after the radiotracer. CT data was obtained and used for attenuation correction and anatomic localization.  Fasting blood glucose: 86 mg/dl  COMPARISON:  81/19/1478.  FINDINGS: Mediastinal blood pool activity: SUV max 1.8  Liver activity: SUV max NA  NECK:  No abnormal hypermetabolism.  Incidental CT findings:  None.  CHEST:  No abnormal hypermetabolism.  Incidental CT findings:  Heart is mildly enlarged.  No pericardial or pleural effusion.  ABDOMEN/PELVIS:  New focal hypermetabolism associated with the left ovary, SUV max 8.6. No additional abnormal hypermetabolism.  Incidental CT findings:  Liver, gallbladder, adrenal glands, kidneys, spleen, pancreas, stomach and bowel are grossly unremarkable. Interval hysterectomy, bilateral salpingectomies and bilateral ovarian transposition.  SKELETON:  No abnormal hypermetabolism.  Incidental CT findings:  Degenerative changes in the spine.  IMPRESSION: 1. New left ovarian hypermetabolism, worrisome for metastatic disease. 2. Interval hysterectomy, bilateral salpingectomies and bilateral ovarian transposition.   Electronically Signed By: Leanna Battles M.D. On: 11/16/2022 08:31

## 2022-12-01 ENCOUNTER — Encounter: Payer: Self-pay | Admitting: Psychiatry

## 2022-12-01 LAB — CANCER ANTIGEN 19-9: CA 19-9: 6 U/mL (ref 0–35)

## 2022-12-01 LAB — CA 125: Cancer Antigen (CA) 125: 7.8 U/mL (ref 0.0–38.1)

## 2022-12-01 LAB — BETA HCG QUANT (REF LAB): hCG Quant: <1 m[IU]/mL

## 2022-12-01 LAB — AFP TUMOR MARKER: AFP, Serum, Tumor Marker: 2.6 ng/mL (ref 0.0–6.4)

## 2022-12-01 LAB — CEA (ACCESS): CEA (CHCC): 2.92 ng/mL (ref 0.00–5.00)

## 2022-12-01 MED ORDER — IBUPROFEN 800 MG PO TABS
800.0000 mg | ORAL_TABLET | Freq: Three times a day (TID) | ORAL | 0 refills | Status: DC | PRN
Start: 2022-12-01 — End: 2023-05-07

## 2022-12-01 NOTE — Addendum Note (Signed)
Addended by: Warner Mccreedy D on: 12/01/2022 12:58 PM   Modules accepted: Orders

## 2022-12-02 LAB — INHIBIN B: Inhibin B: 63.6 pg/mL

## 2022-12-03 ENCOUNTER — Encounter: Payer: Self-pay | Admitting: Family Medicine

## 2022-12-03 NOTE — Telephone Encounter (Signed)
Pt checking on progress of refill.

## 2022-12-03 NOTE — Telephone Encounter (Signed)
Pt has OV appointment scheduled for 12/25/22 Please advise

## 2022-12-04 MED ORDER — AMPHETAMINE-DEXTROAMPHETAMINE 30 MG PO TABS: 30.0000 mg | ORAL_TABLET | Freq: Two times a day (BID) | ORAL | 0 refills | Status: DC

## 2022-12-04 NOTE — Telephone Encounter (Signed)
Done

## 2022-12-07 NOTE — Telephone Encounter (Signed)
Done

## 2022-12-11 ENCOUNTER — Other Ambulatory Visit: Payer: 59

## 2022-12-20 NOTE — Progress Notes (Signed)
COVID Vaccine received:  []  No []  Yes Date of any COVID positive Test in last 90 days:  PCP - Tera Mater. Clent Ridges, MD [phone] (Work)  [phone] (Fax)  Cardiologist - none  Chest x-ray - 2015  Epic EKG - 05-06-2016  Epic   repeat d/t long term CNS stimulant use Stress Test -  ECHO -  Cardiac Cath -   PCR screen: []  Ordered & Completed []   No Order but Needs PROFEND     []   N/A for this surgery  Surgery Plan:  []  Ambulatory   []  Outpatient in bed  []  Admit Anesthesia:    []  General  []  Spinal  []   Choice []   MAC  Bowel Prep - [x]  No  []   Yes _GYN Diet  Pacemaker / ICD device [x]  No []  Yes   Spinal Cord Stimulator:[x]  No []  Yes       History of Sleep Apnea? [x]  No []  Yes   CPAP used?- [x]  No []  Yes    Does the patient monitor blood sugar?   [x]  N/A   []  No []  Yes  Patient has: [x]  NO Hx DM   []  Pre-DM   []  DM1  []   DM2  Blood Thinner / Instructions:   none Aspirin Instructions:   none  ERAS Protocol Ordered: []  No  [x]  Yes PRE-SURGERY []  ENSURE  []  G2   [x]  No Drink Ordered Patient is to be NPO after: 0515  Dental hx: []  Dentures:  []  N/A      []  Bridge or Partial:                   []  Loose or Damaged teeth:   Comments: Had Radical Hysterectomy w/ sentinel node bx in 04-09-2022  Activity level: Patient is able / unable to climb a flight of stairs without difficulty; []  No CP  []  No SOB, but would have ___   Patient can / can not perform ADLs without assistance.   Anesthesia review: anemia, asthma, ADHD- long term CNS stimulant use.   Patient denies shortness of breath, fever, cough and chest pain at PAT appointment.  Patient verbalized understanding and agreement to the Pre-Surgical Instructions that were given to them at this PAT appointment. Patient was also educated of the need to review these PAT instructions again prior to her surgery.I reviewed the appropriate phone numbers to call if they have any and questions or concerns.

## 2022-12-20 NOTE — Patient Instructions (Signed)
SURGICAL WAITING ROOM VISITATION Patients having surgery or a procedure may have no more than 2 support people in the waiting area - these visitors may rotate in the visitor waiting room.   Due to an increase in RSV and influenza rates and associated hospitalizations, children ages 3 and under may not visit patients in [facility] hospitals. If the patient needs to stay at the hospital during part of their recovery, the visitor guidelines for inpatient rooms apply.  PRE-OP VISITATION  Pre-op nurse will coordinate an appropriate time for 1 support person to accompany the patient in pre-op.  This support person may not rotate.  This visitor will be contacted when the time is appropriate for the visitor to come back in the pre-op area.  Please refer to the [facility] website for the visitor guidelines for Inpatients (after your surgery is over and you are in a regular room).  You are not required to quarantine at this time prior to your surgery. However, you must do this: Hand Hygiene often Do NOT share personal items Notify your provider if you are in close contact with someone who has COVID or you develop fever 100.4 or greater, new onset of sneezing, cough, sore throat, shortness of breath or body aches.  If you test positive for Covid or have been in contact with anyone that has tested positive in the last 10 days please notify you surgeon.    Your procedure is scheduled on:  TUESDAY  December 29, 2022  Report to [facility] Main Entrance: [address] entrance where the [employer] is available.   Report to admitting at: 06:00    AM  Call this number if you have any questions or problems the morning of surgery [phone]  Do not eat food after Midnight the night prior to your surgery/procedure.  After Midnight you may have the following liquids until  05:15 AM  DAY OF SURGERY  Clear Liquid Diet Water Black Coffee (sugar ok, NO MILK/CREAM OR CREAMERS)  Tea (sugar ok, NO  MILK/CREAM OR CREAMERS) regular and decaf                             Plain Jell-O  with no fruit (NO RED)                                           Fruit ices (not with fruit pulp, NO RED)                                     Popsicles (NO RED)                                                                  Juice: NO CITRUS JUICES: only apple, WHITE grape, WHITE cranberry Sports drinks like Gatorade or Powerade (NO RED)                 FOLLOW ANY ADDITIONAL PRE OP INSTRUCTIONS YOU RECEIVED FROM YOUR SURGEON'S OFFICE!!!  Eat a light diet the day before surgery.  Examples including soups, broths, toast, yogurt, mashed potatoes.  AVOID GAS PRODUCING FOODS. Things to avoid include carbonated beverages (fizzy beverages, sodas), raw fruits and raw vegetables (uncooked), or beans.      Oral Hygiene is also important to reduce your risk of infection.        Remember - BRUSH YOUR TEETH THE MORNING OF SURGERY WITH YOUR REGULAR TOOTHPASTE  Do NOT smoke after Midnight the night before surgery.  STOP TAKING all Vitamins, Herbs and supplements 1 week before your surgery.   Take ONLY these medicines the morning of surgery with A SIP OF WATER: none                   You may not have any metal on your body including hair pins, jewelry, and body piercing  Do not wear make-up, lotions, powders, perfumes or deodorant  Do not wear nail polish including gel and S&S, artificial / acrylic nails, or any other type of covering on natural nails including finger and toenails. If you have artificial nails, gel coating, etc., that needs to be removed by a nail salon, Please have this removed prior to surgery. Not doing so may mean that your surgery could be cancelled or delayed if the Surgeon or anesthesia staff feels like they are unable to monitor you safely.   Do not shave 48 hours prior to surgery to avoid nicks in your skin which may contribute to postoperative infections.    Contacts, Hearing Aids,  dentures or bridgework may not be worn into surgery. DENTURES WILL BE REMOVED PRIOR TO SURGERY PLEASE DO NOT APPLY "Poly grip" OR ADHESIVES!!!  You may bring a small overnight bag with you on the day of surgery, only pack items that are not valuable.  IS NOT RESPONSIBLE   FOR VALUABLES THAT ARE LOST OR STOLEN.   Patients discharged on the day of surgery will not be allowed to drive home.  Someone NEEDS to stay with you for the first 24 hours after anesthesia.  Do not bring your home medications to the hospital. The Pharmacy will dispense medications listed on your medication list to you during your admission in the Hospital.  Please read over the following fact sheets you were given: IF YOU HAVE QUESTIONS ABOUT YOUR PRE-OP INSTRUCTIONS, PLEASE CALL [phone]   [facility] - Preparing for Surgery Before surgery, you can play an important role.  Because skin is not sterile, your skin needs to be as free of germs as possible.  You can reduce the number of germs on your skin by washing with CHG (chlorahexidine gluconate) soap before surgery.  CHG is an antiseptic cleaner which kills germs and bonds with the skin to continue killing germs even after washing. Please DO NOT use if you have an allergy to CHG or antibacterial soaps.  If your skin becomes reddened/irritated stop using the CHG and inform your nurse when you arrive at Short Stay. Do not shave (including legs and underarms) for at least 48 hours prior to the first CHG shower.  You may shave your face/neck.  Please follow these instructions carefully:  1.  Shower with CHG Soap the night before surgery and the  morning of surgery.  2.  If you choose to wash your hair, wash your hair first as usual with your normal  shampoo.  3.  After you shampoo, rinse your hair and body thoroughly to remove the shampoo.  4.  Use CHG as you would any other liquid soap.  You can apply chg directly to the skin and wash.   Gently with a scrungie or clean washcloth.  5.  Apply the CHG Soap to your body ONLY FROM THE NECK DOWN.   Do not use on face/ open                           Wound or open sores. Avoid contact with eyes, ears mouth and genitals (private parts).                       Wash face,  Genitals (private parts) with your normal soap.             6.  Wash thoroughly, paying special attention to the area where your  surgery  will be performed.  7.  Thoroughly rinse your body with warm water from the neck down.  8.  DO NOT shower/wash with your normal soap after using and rinsing off the CHG Soap.            9.  Pat yourself dry with a clean towel.            10.  Wear clean pajamas.            11.  Place clean sheets on your bed the night of your first shower and do not  sleep with pets.  ON THE DAY OF SURGERY : Do not apply any lotions/deodorants the morning of surgery.  Please wear clean clothes to the hospital/surgery center.     FAILURE TO FOLLOW THESE INSTRUCTIONS MAY RESULT IN THE CANCELLATION OF YOUR SURGERY  PATIENT SIGNATURE_________________________________  NURSE SIGNATURE__________________________________  ________________________________________________________________________

## 2022-12-21 ENCOUNTER — Telehealth: Payer: Self-pay | Admitting: *Deleted

## 2022-12-21 ENCOUNTER — Encounter: Payer: Self-pay | Admitting: *Deleted

## 2022-12-21 ENCOUNTER — Encounter (HOSPITAL_COMMUNITY)
Admission: RE | Admit: 2022-12-21 | Discharge: 2022-12-21 | Disposition: A | Discharge status: Home / Self Care | Payer: 59 | Source: Ambulatory Visit | Attending: Anesthesiology | Admitting: Anesthesiology

## 2022-12-21 DIAGNOSIS — Z01818 Encounter for other preprocedural examination: Secondary | ICD-10-CM

## 2022-12-21 DIAGNOSIS — Z79899 Other long term (current) drug therapy: Secondary | ICD-10-CM

## 2022-12-21 NOTE — Telephone Encounter (Signed)
Attempted to reach patient in regards to her missed pre-surgery testing. Left voicemail requesting call back.

## 2022-12-23 ENCOUNTER — Encounter: Payer: Self-pay | Admitting: *Deleted

## 2022-12-23 NOTE — Telephone Encounter (Signed)
2nd attempt to reach patient in regards to her missed pre-surgery testing. Unable to leave a message due to mailbox being full.

## 2022-12-24 NOTE — Progress Notes (Addendum)
COVID Vaccine Completed: no  Date of COVID positive in last 90 days: no  PCP - Gershon Crane, MD Cardiologist - n/a  Chest x-ray - n/a EKG - n/a Stress Test - n/a ECHO - n/a Cardiac Cath - n/a Pacemaker/ICD device last checked: n/a Spinal Cord Stimulator: n/a  Bowel Prep - light diet day before  Sleep Study - n/a CPAP -   Fasting Blood Sugar - n/a Checks Blood Sugar _____ times a day  Last dose of GLP1 agonist-  N/A GLP1 instructions:  N/A   Last dose of SGLT-2 inhibitors-  N/A SGLT-2 instructions: N/A   Blood Thinner Instructions: n/a Aspirin Instructions: Last Dose:  Activity level: Can go up a flight of stairs and perform activities of daily living without stopping and without symptoms of chest pain or shortness of breath.   Anesthesia review:   Patient denies shortness of breath, fever, cough and chest pain at PAT appointment  Patient verbalized understanding of instructions that were given to them at the PAT appointment. Patient was also instructed that they will need to review over the PAT instructions again at home before surgery.

## 2022-12-24 NOTE — Telephone Encounter (Signed)
Patient returned office call and is ready to reschedule her pre-testing appt. At [facility]. Pt was given Pre-admission testing main number [phone] to call.

## 2022-12-25 ENCOUNTER — Ambulatory Visit (INDEPENDENT_AMBULATORY_CARE_PROVIDER_SITE_OTHER): Payer: 59 | Admitting: Family Medicine

## 2022-12-25 ENCOUNTER — Encounter: Payer: Self-pay | Admitting: Family Medicine

## 2022-12-25 ENCOUNTER — Other Ambulatory Visit: Payer: Self-pay

## 2022-12-25 ENCOUNTER — Encounter (HOSPITAL_COMMUNITY): Payer: Self-pay

## 2022-12-25 ENCOUNTER — Encounter (HOSPITAL_COMMUNITY)
Admission: RE | Admit: 2022-12-25 | Discharge: 2022-12-25 | Disposition: A | Discharge status: Home / Self Care | Payer: 59 | Source: Ambulatory Visit | Attending: Psychiatry | Admitting: Psychiatry

## 2022-12-25 VITALS — BP 110/76 | HR 73 | Temp 98.3°F | Ht 62.0 in | Wt 152.0 lb

## 2022-12-25 DIAGNOSIS — N838 Other noninflammatory disorders of ovary, fallopian tube and broad ligament: Secondary | ICD-10-CM | POA: Diagnosis not present

## 2022-12-25 DIAGNOSIS — C531 Malignant neoplasm of exocervix: Secondary | ICD-10-CM | POA: Insufficient documentation

## 2022-12-25 DIAGNOSIS — R109 Unspecified abdominal pain: Secondary | ICD-10-CM

## 2022-12-25 DIAGNOSIS — Z01812 Encounter for preprocedural laboratory examination: Secondary | ICD-10-CM | POA: Diagnosis present

## 2022-12-25 DIAGNOSIS — R140 Abdominal distension (gaseous): Secondary | ICD-10-CM

## 2022-12-25 DIAGNOSIS — Z0000 Encounter for general adult medical examination without abnormal findings: Secondary | ICD-10-CM

## 2022-12-25 DIAGNOSIS — Z0001 Encounter for general adult medical examination with abnormal findings: Secondary | ICD-10-CM

## 2022-12-25 HISTORY — DX: Unspecified asthma, uncomplicated: J45.909

## 2022-12-25 LAB — COMPREHENSIVE METABOLIC PANEL
ALT: 15 U/L (ref 0–44)
AST: 18 U/L (ref 15–41)
Albumin: 4.2 g/dL (ref 3.5–5.0)
Alkaline Phosphatase: 33 U/L — ABNORMAL LOW (ref 38–126)
Anion gap: 6 (ref 5–15)
BUN: 13 mg/dL (ref 6–20)
CO2: 24 mmol/L (ref 22–32)
Calcium: 8.9 mg/dL (ref 8.9–10.3)
Chloride: 107 mmol/L (ref 98–111)
Creatinine, Ser: 0.61 mg/dL (ref 0.44–1.00)
GFR, Estimated: >60 mL/min (ref >60)
Glucose, Bld: 101 mg/dL — ABNORMAL HIGH (ref 70–99)
Potassium: 4 mmol/L (ref 3.5–5.1)
Sodium: 137 mmol/L (ref 135–145)
Total Bilirubin: 0.6 mg/dL (ref 0.3–1.2)
Total Protein: 6.8 g/dL (ref 6.5–8.1)

## 2022-12-25 LAB — LIPID PANEL
Cholesterol: 229 mg/dL — ABNORMAL HIGH (ref 0–200)
HDL: 83.3 mg/dL (ref >39.00)
LDL Cholesterol: 136 mg/dL — ABNORMAL HIGH (ref 0–99)
NonHDL: 145.58
Total CHOL/HDL Ratio: 3
Triglycerides: 48 mg/dL (ref 0.0–149.0)
VLDL: 9.6 mg/dL (ref 0.0–40.0)

## 2022-12-25 LAB — CBC
HCT: 39.9 % (ref 36.0–46.0)
Hemoglobin: 13.2 g/dL (ref 12.0–15.0)
MCH: 32.9 pg (ref 26.0–34.0)
MCHC: 33.1 g/dL (ref 30.0–36.0)
MCV: 99.5 fL (ref 80.0–100.0)
Platelets: 215 10*3/uL (ref 150–400)
RBC: 4.01 MIL/uL (ref 3.87–5.11)
RDW: 12.6 % (ref 11.5–15.5)
WBC: 7.4 10*3/uL (ref 4.0–10.5)
nRBC: 0 % (ref 0.0–0.2)

## 2022-12-25 LAB — TSH: TSH: 1.74 u[IU]/mL (ref 0.35–5.50)

## 2022-12-25 LAB — HEMOGLOBIN A1C: Hgb A1c MFr Bld: 5.4 % (ref 4.6–6.5)

## 2022-12-25 MED ORDER — LORAZEPAM 0.5 MG PO TABS: 0.5000 mg | ORAL_TABLET | Freq: Two times a day (BID) | ORAL | 2 refills | Status: DC | PRN

## 2022-12-25 NOTE — Progress Notes (Signed)
.  Subjective:    Patient ID: Alison Cervantes, female    DOB: 07/10/1980, 42 y.o.   MRN: [id number]  HPI Here for a well exam. She has a few issues to discuss. Last December a Pap smear revealed she had cervical cancer, and on 04-09-22 she underwent a radical hysterectomy with upper vaginectomy, bilateral salpingectomies, and bilateral ovarian transpositions per Dr. Clide Cliff. As a follow up she had a PET scan on 11-05-22 revealed hypermetabolism in the left ovary. After weighing the options, she has been scheduled for bilateral oophorectomies on 12-29-22. Of course she has been very anxious about all this, and she has had trouble sleeping. She has tried taking Trazodone for sleep with poor success. She has also been having problems with abdominal cramps and bloating. She has found that eliminating wheat products from her diet helps. She asks to be evaluated for any food allergies, and she asks for a colonoscopy.    Review of Systems  Constitutional: Negative.   HENT: Negative.    Eyes: Negative.   Respiratory: Negative.    Cardiovascular: Negative.   Gastrointestinal:  Positive for abdominal distention and abdominal pain. Negative for blood in stool, constipation, diarrhea, nausea and vomiting.  Genitourinary:  Negative for decreased urine volume, difficulty urinating, dyspareunia, dysuria, enuresis, flank pain, frequency, hematuria, pelvic pain and urgency.  Musculoskeletal: Negative.   Skin: Negative.   Neurological: Negative.  Negative for headaches.  Psychiatric/Behavioral:  Positive for sleep disturbance. Negative for agitation and dysphoric mood. The patient is nervous/anxious.        Objective:   Physical Exam Constitutional:      General: She is not in acute distress.    Appearance: Normal appearance. She is well-developed.  HENT:     Head: Normocephalic and atraumatic.     Right Ear: External ear normal.     Left Ear: External ear normal.     Nose: Nose normal.      Mouth/Throat:     Pharynx: No oropharyngeal exudate.  Eyes:     General: No scleral icterus.    Conjunctiva/sclera: Conjunctivae normal.     Pupils: Pupils are equal, round, and reactive to light.  Neck:     Thyroid: No thyromegaly.     Vascular: No JVD.  Cardiovascular:     Rate and Rhythm: Normal rate and regular rhythm.     Pulses: Normal pulses.     Heart sounds: Normal heart sounds. No murmur heard.    No friction rub. No gallop.  Pulmonary:     Effort: Pulmonary effort is normal. No respiratory distress.     Breath sounds: Normal breath sounds. No wheezing or rales.  Chest:     Chest wall: No tenderness.  Abdominal:     General: Bowel sounds are normal. There is no distension.     Palpations: Abdomen is soft. There is no mass.     Tenderness: There is no abdominal tenderness. There is no guarding or rebound.  Musculoskeletal:        General: No tenderness. Normal range of motion.     Cervical back: Normal range of motion and neck supple.  Lymphadenopathy:     Cervical: No cervical adenopathy.  Skin:    General: Skin is warm and dry.     Findings: No erythema or rash.  Neurological:     General: No focal deficit present.     Mental Status: She is alert and oriented to person, place, and time.  Cranial Nerves: No cranial nerve deficit.     Motor: No abnormal muscle tone.     Coordination: Coordination normal.     Deep Tendon Reflexes: Reflexes are normal and symmetric. Reflexes normal.  Psychiatric:        Mood and Affect: Mood normal.        Behavior: Behavior normal.        Thought Content: Thought content normal.        Judgment: Judgment normal.           Assessment & Plan:  Well exam. We discussed diet and exercise. Get fasting labs. She will try Lorazepam to help with anxiety and sleep. We will refer her to Allergy to evaluate for possible food allergies. We will refer her to GI to discuss her  abdominal complaints.  Gershon Crane, MD

## 2022-12-25 NOTE — Patient Instructions (Signed)
SURGICAL WAITING ROOM VISITATION  Patients having surgery or a procedure may have no more than 2 support people in the waiting area - these visitors may rotate.    Children under the age of 77 must have an adult with them who is not the patient.  Due to an increase in RSV and influenza rates and associated hospitalizations, children ages 1 and under may not visit patients in [facility] hospitals.  If the patient needs to stay at the hospital during part of their recovery, the visitor guidelines for inpatient rooms apply. Pre-op nurse will coordinate an appropriate time for 1 support person to accompany patient in pre-op.  This support person may not rotate.    Please refer to the [facility] website for the visitor guidelines for Inpatients (after your surgery is over and you are in a regular room).    Your procedure is scheduled on: 12/29/22   Report to [facility] Main Entrance    Report to admitting at 6:15 AM   Call this number if you have problems the morning of surgery [phone]   Do not eat food :After Midnight.   After Midnight you may have the following liquids until 5:30 AM DAY OF SURGERY  Water Non-Citrus Juices (without pulp, NO RED-Apple, White grape, White cranberry) Black Coffee (NO MILK/CREAM OR CREAMERS, sugar ok)  Clear Tea (NO MILK/CREAM OR CREAMERS, sugar ok) regular and decaf                             Plain Jell-O (NO RED)                                           Fruit ices (not with fruit pulp, NO RED)                                     Popsicles (NO RED)                                                               Sports drinks like Gatorade (NO RED)                      If you have questions, please contact your surgeon's office.   FOLLOW BOWEL PREP AND ANY ADDITIONAL PRE OP INSTRUCTIONS YOU RECEIVED FROM YOUR SURGEON'S OFFICE!!!     Oral Hygiene is also important to reduce your risk of infection.                                     Remember - BRUSH YOUR TEETH THE MORNING OF SURGERY WITH YOUR REGULAR TOOTHPASTE  DENTURES WILL BE REMOVED PRIOR TO SURGERY PLEASE DO NOT APPLY "Poly grip" OR ADHESIVES!!!   Do NOT smoke after Midnight   Stop all vitamins and herbal supplements 7 days before surgery.   Take these medicines the morning of surgery with A SIP OF WATER: None  You may not have any metal on your body including hair pins, jewelry, and body piercing             Do not wear make-up, lotions, powders, perfume, or deodorant  Do not wear nail polish including gel and S&S, artificial/acrylic nails, or any other type of covering on natural nails including finger and toenails. If you have artificial nails, gel coating, etc. that needs to be removed by a nail salon please have this removed prior to surgery or surgery may need to be canceled/ delayed if the surgeon/ anesthesia feels like they are unable to be safely monitored.   Do not shave  48 hours prior to surgery.    Do not bring valuables to the hospital. [facility] IS NOT             RESPONSIBLE   FOR VALUABLES.   Contacts, glasses, dentures or bridgework may not be worn into surgery.  DO NOT BRING YOUR HOME MEDICATIONS TO THE HOSPITAL. PHARMACY WILL DISPENSE MEDICATIONS LISTED ON YOUR MEDICATION LIST TO YOU DURING YOUR ADMISSION IN THE HOSPITAL!    Patients discharged on the day of surgery will not be allowed to drive home.  Someone NEEDS to stay with you for the first 24 hours after anesthesia.              Please read over the following fact sheets you were given: IF YOU HAVE QUESTIONS ABOUT YOUR PRE-OP INSTRUCTIONS PLEASE CALL [phone]    If you received a COVID test during your pre-op visit  it is requested that you wear a mask when out in public, stay away from anyone that may not be feeling well and notify your surgeon if you develop symptoms. If you test positive for Covid or have been in contact with anyone that  has tested positive in the last 10 days please notify you surgeon.    [facility] - Preparing for Surgery Before surgery, you can play an important role.  Because skin is not sterile, your skin needs to be as free of germs as possible.  You can reduce the number of germs on your skin by washing with CHG (chlorahexidine gluconate) soap before surgery.  CHG is an antiseptic cleaner which kills germs and bonds with the skin to continue killing germs even after washing. Please DO NOT use if you have an allergy to CHG or antibacterial soaps.  If your skin becomes reddened/irritated stop using the CHG and inform your nurse when you arrive at Short Stay. Do not shave (including legs and underarms) for at least 48 hours prior to the first CHG shower.  You may shave your face/neck.  Please follow these instructions carefully:  1.  Shower with CHG Soap the night before surgery and the  morning of surgery.  2.  If you choose to wash your hair, wash your hair first as usual with your normal  shampoo.  3.  After you shampoo, rinse your hair and body thoroughly to remove the shampoo.                             4.  Use CHG as you would any other liquid soap.  You can apply chg directly to the skin and wash.  Gently with a scrungie or clean washcloth.  5.  Apply the CHG Soap to your body ONLY FROM THE NECK DOWN.   Do   not use on  face/ open                           Wound or open sores. Avoid contact with eyes, ears mouth and   genitals (private parts).                       Wash face,  Genitals (private parts) with your normal soap.             6.  Wash thoroughly, paying special attention to the area where your    surgery  will be performed.  7.  Thoroughly rinse your body with warm water from the neck down.  8.  DO NOT shower/wash with your normal soap after using and rinsing off the CHG Soap.                9.  Pat yourself dry with a clean towel.            10.  Wear clean pajamas.            11.  Place  clean sheets on your bed the night of your first shower and do not  sleep with pets. Day of Surgery : Do not apply any lotions/deodorants the morning of surgery.  Please wear clean clothes to the hospital/surgery center.  FAILURE TO FOLLOW THESE INSTRUCTIONS MAY RESULT IN THE CANCELLATION OF YOUR SURGERY  PATIENT SIGNATURE_________________________________  NURSE SIGNATURE__________________________________  ________________________________________________________________________ WHAT IS A BLOOD TRANSFUSION? Blood Transfusion Information  A transfusion is the replacement of blood or some of its parts. Blood is made up of multiple cells which provide different functions. Red blood cells carry oxygen and are used for blood loss replacement. White blood cells fight against infection. Platelets control bleeding. Plasma helps clot blood. Other blood products are available for specialized needs, such as hemophilia or other clotting disorders. BEFORE THE TRANSFUSION  Who gives blood for transfusions?  Healthy volunteers who are fully evaluated to make sure their blood is safe. This is blood bank blood. Transfusion therapy is the safest it has ever been in the practice of medicine. Before blood is taken from a donor, a complete history is taken to make sure that person has no history of diseases nor engages in risky social behavior (examples are intravenous drug use or sexual activity with multiple partners). The donor's travel history is screened to minimize risk of transmitting infections, such as malaria. The donated blood is tested for signs of infectious diseases, such as HIV and hepatitis. The blood is then tested to be sure it is compatible with you in order to minimize the chance of a transfusion reaction. If you or a relative donates blood, this is often done in anticipation of surgery and is not appropriate for emergency situations. It takes many days to process the donated blood. RISKS AND  COMPLICATIONS Although transfusion therapy is very safe and saves many lives, the main dangers of transfusion include:  Getting an infectious disease. Developing a transfusion reaction. This is an allergic reaction to something in the blood you were given. Every precaution is taken to prevent this. The decision to have a blood transfusion has been considered carefully by your caregiver before blood is given. Blood is not given unless the benefits outweigh the risks. AFTER THE TRANSFUSION Right after receiving a blood transfusion, you will usually feel much better and more energetic. This is especially true if your red blood cells have gotten  low (anemic). The transfusion raises the level of the red blood cells which carry oxygen, and this usually causes an energy increase. The nurse administering the transfusion will monitor you carefully for complications. HOME CARE INSTRUCTIONS  No special instructions are needed after a transfusion. You may find your energy is better. Speak with your caregiver about any limitations on activity for underlying diseases you may have. SEEK MEDICAL CARE IF:  Your condition is not improving after your transfusion. You develop redness or irritation at the intravenous (IV) site. SEEK IMMEDIATE MEDICAL CARE IF:  Any of the following symptoms occur over the next 12 hours: Shaking chills. You have a temperature by mouth above 102 F (38.9 C), not controlled by medicine. Chest, back, or muscle pain. People around you feel you are not acting correctly or are confused. Shortness of breath or difficulty breathing. Dizziness and fainting. You get a rash or develop hives. You have a decrease in urine output. Your urine turns a dark color or changes to pink, red, or brown. Any of the following symptoms occur over the next 10 days: You have a temperature by mouth above 102 F (38.9 C), not controlled by medicine. Shortness of breath. Weakness after normal activity. The  white part of the eye turns yellow (jaundice). You have a decrease in the amount of urine or are urinating less often. Your urine turns a dark color or changes to pink, red, or brown. Document Released: 02/14/2000 Document Revised: 05/11/2011 Document Reviewed: 10/03/2007 [facility] Patient Information 2014 [place], [place].  _______________________________________________________________________

## 2022-12-28 ENCOUNTER — Telehealth: Payer: Self-pay | Admitting: *Deleted

## 2022-12-28 NOTE — Telephone Encounter (Signed)
Telephone call to check on pre-operative status.  Patient compliant with pre-operative instructions.  Reinforced nothing to eat after midnight. Clear liquids until 0445. Patient to arrive at 65.  No questions or concerns voiced.  Instructed to call for any needs.

## 2022-12-28 NOTE — Anesthesia Preprocedure Evaluation (Signed)
Anesthesia Evaluation  Patient identified by MRN, date of birth, ID band Patient awake    Reviewed: Allergy & Precautions, NPO status , Patient's Chart, lab work & pertinent test results  Airway Mallampati: III  TM Distance: >3 FB Neck ROM: Full    Dental no notable dental hx. (+) Teeth Intact, Dental Advisory Given   Pulmonary neg pulmonary ROS   Pulmonary exam normal breath sounds clear to auscultation       Cardiovascular negative cardio ROS Normal cardiovascular exam Rhythm:Regular Rate:Normal     Neuro/Psych  Headaches PSYCHIATRIC DISORDERS  Depression       GI/Hepatic PUD,,,(+)     substance abuse  alcohol use  Endo/Other  negative endocrine ROS    Renal/GU negative Renal ROS  negative genitourinary   Musculoskeletal negative musculoskeletal ROS (+)    Abdominal   Peds  (+) ADHD Hematology negative hematology ROS (+)   Anesthesia Other Findings   Reproductive/Obstetrics                             Anesthesia Physical Anesthesia Plan  ASA: 2  Anesthesia Plan: General   Post-op Pain Management: Tylenol PO (pre-op)* and Ketamine IV*   Induction: Intravenous  PONV Risk Score and Plan: 3 and Midazolam, Dexamethasone and Ondansetron  Airway Management Planned: Oral ETT  Additional Equipment:   Intra-op Plan:   Post-operative Plan: Extubation in OR  Informed Consent: I have reviewed the patients History and Physical, chart, labs and discussed the procedure including the risks, benefits and alternatives for the proposed anesthesia with the patient or authorized representative who has indicated his/her understanding and acceptance.     Dental advisory given  Plan Discussed with: CRNA  Anesthesia Plan Comments: (2 IVs)       Anesthesia Quick Evaluation

## 2022-12-29 ENCOUNTER — Other Ambulatory Visit: Payer: Self-pay

## 2022-12-29 ENCOUNTER — Ambulatory Visit (HOSPITAL_BASED_OUTPATIENT_CLINIC_OR_DEPARTMENT_OTHER): Payer: 59 | Admitting: Anesthesiology

## 2022-12-29 ENCOUNTER — Ambulatory Visit (HOSPITAL_COMMUNITY): Payer: 59 | Admitting: Anesthesiology

## 2022-12-29 ENCOUNTER — Encounter (HOSPITAL_COMMUNITY): Payer: Self-pay | Admitting: Psychiatry

## 2022-12-29 ENCOUNTER — Encounter (HOSPITAL_COMMUNITY)
Admission: RE | Disposition: A | Discharge status: Home / Self Care | Payer: Self-pay | Source: Home / Self Care | Attending: Psychiatry

## 2022-12-29 ENCOUNTER — Ambulatory Visit (HOSPITAL_COMMUNITY)
Admission: RE | Admit: 2022-12-29 | Discharge: 2022-12-29 | Disposition: A | Discharge status: Home / Self Care | Payer: 59 | Attending: Psychiatry | Admitting: Psychiatry

## 2022-12-29 DIAGNOSIS — N83201 Unspecified ovarian cyst, right side: Secondary | ICD-10-CM

## 2022-12-29 DIAGNOSIS — Z9079 Acquired absence of other genital organ(s): Secondary | ICD-10-CM | POA: Diagnosis not present

## 2022-12-29 DIAGNOSIS — N838 Other noninflammatory disorders of ovary, fallopian tube and broad ligament: Secondary | ICD-10-CM | POA: Diagnosis present

## 2022-12-29 DIAGNOSIS — N8302 Follicular cyst of left ovary: Secondary | ICD-10-CM | POA: Insufficient documentation

## 2022-12-29 DIAGNOSIS — C531 Malignant neoplasm of exocervix: Secondary | ICD-10-CM

## 2022-12-29 DIAGNOSIS — K660 Peritoneal adhesions (postprocedural) (postinfection): Secondary | ICD-10-CM | POA: Insufficient documentation

## 2022-12-29 DIAGNOSIS — F909 Attention-deficit hyperactivity disorder, unspecified type: Secondary | ICD-10-CM | POA: Diagnosis not present

## 2022-12-29 DIAGNOSIS — N858 Other specified noninflammatory disorders of uterus: Secondary | ICD-10-CM

## 2022-12-29 DIAGNOSIS — Z90710 Acquired absence of both cervix and uterus: Secondary | ICD-10-CM | POA: Insufficient documentation

## 2022-12-29 DIAGNOSIS — Z8711 Personal history of peptic ulcer disease: Secondary | ICD-10-CM | POA: Insufficient documentation

## 2022-12-29 DIAGNOSIS — Z8541 Personal history of malignant neoplasm of cervix uteri: Secondary | ICD-10-CM | POA: Diagnosis not present

## 2022-12-29 DIAGNOSIS — Z79899 Other long term (current) drug therapy: Secondary | ICD-10-CM

## 2022-12-29 DIAGNOSIS — Z01818 Encounter for other preprocedural examination: Secondary | ICD-10-CM

## 2022-12-29 DIAGNOSIS — N83202 Unspecified ovarian cyst, left side: Secondary | ICD-10-CM

## 2022-12-29 HISTORY — PX: ROBOTIC ASSISTED SALPINGO OOPHERECTOMY: SHX6082

## 2022-12-29 LAB — TYPE AND SCREEN
ABO/RH(D): AB NEG
Antibody Screen: NEGATIVE

## 2022-12-29 LAB — ABO/RH: ABO/RH(D): AB NEG

## 2022-12-29 SURGERY — SALPINGO-OOPHORECTOMY, ROBOT-ASSISTED
Anesthesia: General

## 2022-12-29 MED ORDER — DEXAMETHASONE SODIUM PHOSPHATE 10 MG/ML IJ SOLN
INTRAMUSCULAR | Status: AC
  Filled 2022-12-29: qty 1

## 2022-12-29 MED ORDER — HEPARIN SODIUM (PORCINE) 5000 UNIT/ML IJ SOLN
5000.0000 [IU] | INTRAMUSCULAR | Status: AC
  Administered 2022-12-29: 5000 [IU] via SUBCUTANEOUS
  Filled 2022-12-29: qty 1

## 2022-12-29 MED ORDER — OXYCODONE HCL 5 MG/5ML PO SOLN: 5.0000 mg | Freq: Once | ORAL | Status: AC | PRN

## 2022-12-29 MED ORDER — SUGAMMADEX SODIUM 200 MG/2ML IV SOLN
INTRAVENOUS | Status: DC | PRN
  Administered 2022-12-29: 200 mg via INTRAVENOUS

## 2022-12-29 MED ORDER — ORAL CARE MOUTH RINSE: 15.0000 mL | Freq: Once | OROMUCOSAL | Status: AC

## 2022-12-29 MED ORDER — ACETAMINOPHEN 500 MG PO TABS
1000.0000 mg | ORAL_TABLET | ORAL | Status: AC
  Administered 2022-12-29: 1000 mg via ORAL
  Filled 2022-12-29: qty 2

## 2022-12-29 MED ORDER — FENTANYL CITRATE PF 50 MCG/ML IJ SOSY
PREFILLED_SYRINGE | INTRAMUSCULAR | Status: AC
  Filled 2022-12-29: qty 1

## 2022-12-29 MED ORDER — LIDOCAINE HCL 2 % IJ SOLN
INTRAMUSCULAR | Status: AC
  Filled 2022-12-29: qty 20

## 2022-12-29 MED ORDER — STERILE WATER FOR IRRIGATION IR SOLN
Status: DC | PRN
  Administered 2022-12-29: 1000 mL

## 2022-12-29 MED ORDER — HYDROMORPHONE HCL 1 MG/ML IJ SOLN
INTRAMUSCULAR | Status: AC
  Filled 2022-12-29: qty 1

## 2022-12-29 MED ORDER — ACETAMINOPHEN 10 MG/ML IV SOLN
1000.0000 mg | Freq: Once | INTRAVENOUS | Status: DC | PRN
  Administered 2022-12-29: 1000 mg via INTRAVENOUS

## 2022-12-29 MED ORDER — FENTANYL CITRATE (PF) 100 MCG/2ML IJ SOLN
INTRAMUSCULAR | Status: DC | PRN
  Administered 2022-12-29: 50 ug via INTRAVENOUS
  Administered 2022-12-29: 100 ug via INTRAVENOUS
  Administered 2022-12-29 (×2): 50 ug via INTRAVENOUS

## 2022-12-29 MED ORDER — SODIUM CHLORIDE (PF) 0.9 % IJ SOLN
INTRAMUSCULAR | Status: AC
  Filled 2022-12-29: qty 50

## 2022-12-29 MED ORDER — SODIUM CHLORIDE (PF) 0.9 % IJ SOLN
INTRAMUSCULAR | Status: DC | PRN
Start: 2022-12-29 — End: 2022-12-29
  Administered 2022-12-29: 50 mL

## 2022-12-29 MED ORDER — LACTATED RINGERS IV SOLN: INTRAVENOUS | Status: DC | PRN

## 2022-12-29 MED ORDER — OXYCODONE HCL 5 MG PO TABS
5.0000 mg | ORAL_TABLET | Freq: Once | ORAL | Status: AC | PRN
  Administered 2022-12-29: 5 mg via ORAL

## 2022-12-29 MED ORDER — ROCURONIUM BROMIDE 100 MG/10ML IV SOLN
INTRAVENOUS | Status: DC | PRN
  Administered 2022-12-29: 20 mg via INTRAVENOUS
  Administered 2022-12-29: 50 mg via INTRAVENOUS

## 2022-12-29 MED ORDER — DEXAMETHASONE SODIUM PHOSPHATE 4 MG/ML IJ SOLN
4.0000 mg | INTRAMUSCULAR | Status: AC
  Administered 2022-12-29: 10 mg via INTRAVENOUS

## 2022-12-29 MED ORDER — BUPIVACAINE HCL 0.25 % IJ SOLN
INTRAMUSCULAR | Status: DC | PRN
  Administered 2022-12-29: 25 mL

## 2022-12-29 MED ORDER — FENTANYL CITRATE PF 50 MCG/ML IJ SOSY
25.0000 ug | PREFILLED_SYRINGE | INTRAMUSCULAR | Status: DC | PRN
  Administered 2022-12-29 (×3): 50 ug via INTRAVENOUS

## 2022-12-29 MED ORDER — KETAMINE HCL 50 MG/ML IJ SOLN
INTRAMUSCULAR | Status: DC | PRN
  Administered 2022-12-29: 20 mg via INTRAMUSCULAR

## 2022-12-29 MED ORDER — SODIUM CHLORIDE 0.9% FLUSH: 3.0000 mL | Freq: Two times a day (BID) | INTRAVENOUS | Status: DC

## 2022-12-29 MED ORDER — FENTANYL CITRATE (PF) 250 MCG/5ML IJ SOLN
INTRAMUSCULAR | Status: AC
  Filled 2022-12-29: qty 5

## 2022-12-29 MED ORDER — LIDOCAINE HCL (PF) 2 % IJ SOLN
INTRAMUSCULAR | Status: DC | PRN
  Administered 2022-12-29: 1.5 mg/kg/h via INTRADERMAL

## 2022-12-29 MED ORDER — MIDAZOLAM HCL 5 MG/5ML IJ SOLN
INTRAMUSCULAR | Status: DC | PRN
  Administered 2022-12-29: 2 mg via INTRAVENOUS

## 2022-12-29 MED ORDER — KETAMINE HCL 50 MG/5ML IJ SOSY
PREFILLED_SYRINGE | INTRAMUSCULAR | Status: AC
Start: 2022-12-29 — End: ?
  Filled 2022-12-29: qty 5

## 2022-12-29 MED ORDER — CHLORHEXIDINE GLUCONATE 0.12 % MT SOLN
15.0000 mL | Freq: Once | OROMUCOSAL | Status: AC
  Administered 2022-12-29: 15 mL via OROMUCOSAL

## 2022-12-29 MED ORDER — HYDROMORPHONE HCL 1 MG/ML IJ SOLN
0.2500 mg | INTRAMUSCULAR | Status: DC | PRN
  Administered 2022-12-29 (×3): 0.5 mg via INTRAVENOUS

## 2022-12-29 MED ORDER — ACETAMINOPHEN 500 MG PO TABS: 1000.0000 mg | ORAL_TABLET | Freq: Once | ORAL | Status: DC

## 2022-12-29 MED ORDER — MIDAZOLAM HCL 2 MG/2ML IJ SOLN
INTRAMUSCULAR | Status: AC
Start: 2022-12-29 — End: ?
  Filled 2022-12-29: qty 2

## 2022-12-29 MED ORDER — LIDOCAINE HCL (PF) 2 % IJ SOLN
INTRAMUSCULAR | Status: AC
  Filled 2022-12-29: qty 5

## 2022-12-29 MED ORDER — OXYCODONE HCL 5 MG PO TABS
ORAL_TABLET | ORAL | Status: AC
  Filled 2022-12-29: qty 1

## 2022-12-29 MED ORDER — ROCURONIUM BROMIDE 10 MG/ML (PF) SYRINGE
PREFILLED_SYRINGE | INTRAVENOUS | Status: AC
  Filled 2022-12-29: qty 10

## 2022-12-29 MED ORDER — LIDOCAINE HCL (CARDIAC) PF 100 MG/5ML IV SOSY
PREFILLED_SYRINGE | INTRAVENOUS | Status: DC | PRN
  Administered 2022-12-29: 60 mg via INTRAVENOUS

## 2022-12-29 MED ORDER — PROPOFOL 10 MG/ML IV BOLUS
INTRAVENOUS | Status: AC
  Filled 2022-12-29: qty 20

## 2022-12-29 MED ORDER — BUPIVACAINE HCL 0.25 % IJ SOLN
INTRAMUSCULAR | Status: AC
  Filled 2022-12-29: qty 1

## 2022-12-29 MED ORDER — ONDANSETRON HCL 4 MG/2ML IJ SOLN
INTRAMUSCULAR | Status: AC
  Filled 2022-12-29: qty 2

## 2022-12-29 MED ORDER — PROPOFOL 10 MG/ML IV BOLUS
INTRAVENOUS | Status: DC | PRN
  Administered 2022-12-29: 120 mg via INTRAVENOUS

## 2022-12-29 MED ORDER — ACETAMINOPHEN 10 MG/ML IV SOLN
INTRAVENOUS | Status: AC
  Filled 2022-12-29: qty 100

## 2022-12-29 MED ORDER — ONDANSETRON HCL 4 MG/2ML IJ SOLN
INTRAMUSCULAR | Status: DC | PRN
  Administered 2022-12-29: 4 mg via INTRAVENOUS

## 2022-12-29 MED ORDER — SCOPOLAMINE 1 MG/3DAYS TD PT72
1.0000 | MEDICATED_PATCH | TRANSDERMAL | Status: DC
  Administered 2022-12-29: 1.5 mg via TRANSDERMAL
  Filled 2022-12-29: qty 1

## 2022-12-29 SURGICAL SUPPLY — 81 items
ADH SKN CLS APL DERMABOND .7 (GAUZE/BANDAGES/DRESSINGS) ×1
AGENT HMST KT MTR STRL THRMB (HEMOSTASIS)
APL ESCP 34 STRL LF DISP (HEMOSTASIS) ×1
APPLICATOR SURGIFLO ENDO (HEMOSTASIS) IMPLANT
BAG LAPAROSCOPIC 12 15 PORT 16 (BASKET) IMPLANT
BAG RETRIEVAL 12/15 (BASKET)
BLADE SURG SZ10 CARB STEEL (BLADE) IMPLANT
CNTNR URN SCR LID CUP LEK RST (MISCELLANEOUS) IMPLANT
CONT SPEC 4OZ STRL OR WHT (MISCELLANEOUS) ×1
COVER BACK TABLE 60X90IN (DRAPES) ×1 IMPLANT
COVER TIP SHEARS 8 DVNC (MISCELLANEOUS) ×1 IMPLANT
DERMABOND ADVANCED .7 DNX12 (GAUZE/BANDAGES/DRESSINGS) ×1 IMPLANT
DRAPE ARM DVNC X/XI (DISPOSABLE) ×4 IMPLANT
DRAPE COLUMN DVNC XI (DISPOSABLE) ×1 IMPLANT
DRAPE SHEET LG 3/4 BI-LAMINATE (DRAPES) ×1 IMPLANT
DRAPE SURG IRRIG POUCH 19X23 (DRAPES) ×1 IMPLANT
DRIVER NDL MEGA SUTCUT DVNCXI (INSTRUMENTS) ×1 IMPLANT
DRIVER NDLE MEGA SUTCUT DVNCXI (INSTRUMENTS) ×1
DRSG OPSITE POSTOP 4X6 (GAUZE/BANDAGES/DRESSINGS) IMPLANT
DRSG OPSITE POSTOP 4X8 (GAUZE/BANDAGES/DRESSINGS) IMPLANT
ELECT PENCIL ROCKER SW 15FT (MISCELLANEOUS) IMPLANT
ELECT REM PT RETURN 15FT ADLT (MISCELLANEOUS) ×1 IMPLANT
FORCEPS BPLR FENES DVNC XI (FORCEP) ×1 IMPLANT
FORCEPS PROGRASP DVNC XI (FORCEP) ×1 IMPLANT
GAUZE 4X4 16PLY ~~LOC~~+RFID DBL (SPONGE) ×1 IMPLANT
GLOVE BIO SURGEON STRL SZ 6 (GLOVE) ×4 IMPLANT
GLOVE BIO SURGEON STRL SZ 6.5 (GLOVE) ×1 IMPLANT
GLOVE BIOGEL PI IND STRL 6.5 (GLOVE) ×2 IMPLANT
GOWN STRL REUS W/ TWL LRG LVL3 (GOWN DISPOSABLE) ×4 IMPLANT
GOWN STRL REUS W/TWL LRG LVL3 (GOWN DISPOSABLE) ×4
GRASPER SUT TROCAR 14GX15 (MISCELLANEOUS) IMPLANT
HOLDER FOLEY CATH W/STRAP (MISCELLANEOUS) IMPLANT
IRRIG SUCT STRYKERFLOW 2 WTIP (MISCELLANEOUS) ×1
IRRIGATION SUCT STRKRFLW 2 WTP (MISCELLANEOUS) ×1 IMPLANT
KIT PROCEDURE DVNC SI (MISCELLANEOUS) IMPLANT
KIT TURNOVER KIT A (KITS) IMPLANT
LIGASURE IMPACT 36 18CM CVD LR (INSTRUMENTS) IMPLANT
MANIPULATOR ADVINCU DEL 3.0 PL (MISCELLANEOUS) IMPLANT
MANIPULATOR ADVINCU DEL 3.5 PL (MISCELLANEOUS) IMPLANT
MANIPULATOR UTERINE 4.5 ZUMI (MISCELLANEOUS) IMPLANT
NDL HYPO 21X1.5 SAFETY (NEEDLE) ×1 IMPLANT
NDL INSUFFLATION 14GA 120MM (NEEDLE) IMPLANT
NDL SPNL 20GX3.5 QUINCKE YW (NEEDLE) IMPLANT
NEEDLE HYPO 21X1.5 SAFETY (NEEDLE) ×1
NEEDLE INSUFFLATION 14GA 120MM (NEEDLE) ×1
NEEDLE SPNL 20GX3.5 QUINCKE YW (NEEDLE)
OBTURATOR OPTICAL STND 8 DVNC (TROCAR) ×1
OBTURATOR OPTICALSTD 8 DVNC (TROCAR) ×1 IMPLANT
PACK ROBOT GYN CUSTOM WL (TRAY / TRAY PROCEDURE) ×1 IMPLANT
PAD ARMBOARD 7.5X6 YLW CONV (MISCELLANEOUS) ×1 IMPLANT
PAD POSITIONING PINK XL (MISCELLANEOUS) ×1 IMPLANT
PORT ACCESS TROCAR AIRSEAL 12 (TROCAR) IMPLANT
SCISSORS LAP 5X35 DISP (ENDOMECHANICALS) IMPLANT
SCISSORS MNPLR CVD DVNC XI (INSTRUMENTS) ×1 IMPLANT
SCRUB CHG 4% DYNA-HEX 4OZ (MISCELLANEOUS) ×2 IMPLANT
SEAL UNIV 5-12 XI (MISCELLANEOUS) ×4 IMPLANT
SET TRI-LUMEN FLTR TB AIRSEAL (TUBING) ×1 IMPLANT
SPIKE FLUID TRANSFER (MISCELLANEOUS) ×1 IMPLANT
SPONGE T-LAP 18X18 ~~LOC~~+RFID (SPONGE) IMPLANT
SURGIFLO W/THROMBIN 8M KIT (HEMOSTASIS) IMPLANT
SUT MNCRL AB 4-0 PS2 18 (SUTURE) IMPLANT
SUT PDS AB 1 TP1 54 (SUTURE) IMPLANT
SUT VIC AB 0 CT1 27 (SUTURE)
SUT VIC AB 0 CT1 27XBRD ANTBC (SUTURE) IMPLANT
SUT VIC AB 2-0 CT1 27 (SUTURE)
SUT VIC AB 2-0 CT1 TAPERPNT 27 (SUTURE) IMPLANT
SUT VIC AB 4-0 PS2 18 (SUTURE) ×2 IMPLANT
SUT VICRYL 0 27 CT2 27 ABS (SUTURE) ×1 IMPLANT
SUT VLOC 180 0 9IN GS21 (SUTURE) IMPLANT
SYR 10ML LL (SYRINGE) IMPLANT
SYS BAG RETRIEVAL 10MM (BASKET) ×1
SYS WOUND ALEXIS 18CM MED (MISCELLANEOUS)
SYSTEM BAG RETRIEVAL 10MM (BASKET) IMPLANT
SYSTEM WOUND ALEXIS 18CM MED (MISCELLANEOUS) IMPLANT
TOWEL OR NON WOVEN STRL DISP B (DISPOSABLE) IMPLANT
TRAP SPECIMEN MUCUS 40CC (MISCELLANEOUS) IMPLANT
TRAY FOLEY MTR SLVR 16FR STAT (SET/KITS/TRAYS/PACK) ×1 IMPLANT
TROCAR PORT AIRSEAL 5X120 (TROCAR) IMPLANT
UNDERPAD 30X36 HEAVY ABSORB (UNDERPADS AND DIAPERS) ×2 IMPLANT
WATER STERILE IRR 1000ML POUR (IV SOLUTION) ×1 IMPLANT
YANKAUER SUCT BULB TIP 10FT TU (MISCELLANEOUS) IMPLANT

## 2022-12-29 NOTE — Interval H&P Note (Signed)
History and Physical Interval Note:  12/29/2022 7:29 AM  Alison Cervantes  has presented today for surgery, with the diagnosis of OVARIAN MASS.  The various methods of treatment have been discussed with the patient and family. After consideration of risks, benefits and other options for treatment, the patient has consented to  Procedure(s): XI ROBOTIC ASSISTED UNILATERAL, POSSIBLE BILATERAL SALPINGO OOPHORECTOMY (N/A) POSSIBLE MINI LAPAROTOMY; POSSIBLE  STAGING (N/A) as a surgical intervention.  The patient's history has been reviewed, patient examined, no change in status, stable for surgery.  I have reviewed the patient's chart and labs.  Questions were answered to the patient's satisfaction.     Kristof Nadeem

## 2022-12-29 NOTE — Discharge Instructions (Addendum)
AFTER SURGERY INSTRUCTIONS   Return to work: 4-6 weeks if applicable   Activity: 1. Be up and out of the bed during the day.  Take a nap if needed.  You may walk up steps but be careful and use the hand rail.  Stair climbing will tire you more than you think, you may need to stop part way and rest.    2. No lifting or straining for 6 weeks over 10 pounds. No pushing, pulling, straining for 6 weeks.   3. No driving for around 1 week(s).  Do not drive if you are taking narcotic pain medicine and make sure that your reaction time has returned.    4. You can shower as soon as the next day after surgery. Shower daily.  Use your regular soap and water (not directly on the incision) and pat your incision(s) dry afterwards; don't rub.  No tub baths or submerging your body in water until cleared by your surgeon. If you have the soap that was given to you by pre-surgical testing that was used before surgery, you do not need to use it afterwards because this can irritate your incisions.    5. No sexual activity and nothing in the vagina for 4-6 weeks.   6. You may experience a small amount of clear drainage from your incisions, which is normal.  If the drainage persists, increases, or changes color please call the office.   7. Do not use creams, lotions, or ointments such as neosporin on your incisions after surgery until advised by your surgeon because they can cause removal of the dermabond glue on your incisions.     8. Take Tylenol or ibuprofen first for pain if you are able to take these medications and only use narcotic pain medication for severe pain not relieved by the Tylenol or Ibuprofen.  Monitor your Tylenol intake to a max of 4,000 mg in a 24 hour period. You can alternate these medications after surgery.   Diet: 1. Low sodium Heart Healthy Diet is recommended but you are cleared to resume your normal (before surgery) diet after your procedure.   2. It is safe to use a laxative, such as  Miralax or Colace, if you have difficulty moving your bowels. You have been prescribed Sennakot-S to take at bedtime every evening after surgery to keep bowel movements regular and to prevent constipation.     Wound Care: 1. Keep clean and dry.  Shower daily.   Reasons to call the Doctor: Fever - Oral temperature greater than 100.4 degrees Fahrenheit Foul-smelling vaginal discharge Difficulty urinating Nausea and vomiting Increased pain at the site of the incision that is unrelieved with pain medicine. Difficulty breathing with or without chest pain New calf pain especially if only on one side Sudden, continuing increased vaginal bleeding with or without clots.   Contacts: For questions or concerns you should contact:   Dr. Meredith Newton at [phone]   Melissa Cross, NP at [phone]   After Hours: call [phone] and have the GYN Oncologist paged/contacted (after 5 pm or on the weekends). You will speak with an after hours RN and let he or she know you have had surgery.   Messages sent via mychart are for non-urgent matters and are not responded to after hours so for urgent needs, please call the after hours number.   

## 2022-12-29 NOTE — Plan of Care (Signed)
 [facility] Tonsillectomy/Adenoidectomy, Postoperative PEDS care plan entered in error.

## 2022-12-29 NOTE — Transfer of Care (Signed)
Immediate Anesthesia Transfer of Care Note  Patient: Alison Cervantes  Procedure(s) Performed: XI ROBOTIC ASSISTED LEFT OOPHORECTOMY  Patient Location: PACU  Anesthesia Type:General  Level of Consciousness: sedated and responds to stimulation  Airway & Oxygen Therapy: Patient Spontanous Breathing and Patient connected to face mask oxygen  Post-op Assessment: Report given to RN and Post -op Vital signs reviewed and stable  Post vital signs: Reviewed and stable  Last Vitals:  Vitals Value Taken Time  BP 122/74 12/29/22 1038  Temp    Pulse 63 12/29/22 1040  Resp 12 12/29/22 1040  SpO2 98 % 12/29/22 1040  Vitals shown include unfiled device data.  Last Pain:  Vitals:   12/29/22 0639  TempSrc:   PainSc: 0-No pain         Complications: No notable events documented.

## 2022-12-29 NOTE — Op Note (Signed)
GYNECOLOGIC ONCOLOGY OPERATIVE NOTE  Date of Service: 12/29/2022  Preoperative Diagnosis: PET avid left ovarian mass, history of cervical cancer  Postoperative Diagnosis: Benign left ovary, history of cervical cancer, adhesions  Procedures: Robotic assisted left oophorectomy, adhesiolysis  Surgeon: Clide Cliff, MD  Assistants: Antionette Char, MD and (an MD assistant was necessary for tissue manipulation, management of robotic instrumentation, retraction and positioning due to the complexity of the case and hospital policies)  Anesthesia: General  Estimated Blood Loss: 20 mL    Urine Output: 100 ml, clear yellow  Findings: On entry to abdomen, normal upper abdominal survey with smooth diaphragm, liver, stomach and normal appearing omentum and bowel. Adhesions of the distal omentum to the midline inferior abdominal wall. Uterus, cervix bilateral fallopian tubes surgically absent.  Evidence of bilateral ovarian transposition with bilateral ovaries adherent to the psoas tendon.  Adhesions of the sigmoid colon overlying the left ovary, and adhesions of the cecum and appendix overlying the right ovary.  Additional adhesions of the sigmoid colon along the left pelvic sidewall.  Left ovary grossly normal appearing.  Right ovary mildly enlarged with apparent normal-appearing follicle.  Left ovary with IOFS benign.   Specimens:  ID Type Source Tests Collected by Time Destination  1 : OMENTAL NODULE Tissue PATH Soft tissue SURGICAL PATHOLOGY Clide Cliff, MD 12/29/2022 0911   2 : LEFT OVARY Tissue PATH Soft tissue SURGICAL PATHOLOGY Clide Cliff, MD 12/29/2022 0912   A : PELVIC WASHINGS Body Fluid PATH Cytology Pelvic Washing CYTOLOGY - NON PAP Clide Cliff, MD 12/29/2022 0909     Complications:  None  Indications for Procedure: Alison Cervantes is a 42 y.o. woman with a history of cervical cancer treated with radical abdominal hysterectomy.  On surveillance PET imaging, PET  avid lesion noted on the left ovary with subsequent pelvic ultrasound demonstrating a corresponding small complex lesion on the left ovary.  Given this, decision made to proceed with left oophorectomy for definitive pathologic diagnosis.  Prior to the procedure, all risks, benefits, and alternatives were discussed and informed surgical consent was signed.  Procedure: Patient was taken to the operating room where general anesthesia was achieved.  She was positioned in dorsal lithotomy and prepped and draped.  A foley catheter was inserted into the bladder.  A sponge stick was placed in the vagina.  A 12 mm incision was made in the left upper quadrant near Palmer's point.  The abdomen was entered with a 5 mm OptiView trocar under direct visualization.  The abdomen was insufflated, the patient placed in steep Trendelenburg, and additional trocars were placed as follows: an 8mm robotic trocar superior to the umbilicus, one 8 mm robotic trocar in the right abdomen, and one 8 mm robotic trocar in the left abdomen.  The left upper quadrant trocar was removed and replaced with a 12 mm airseal trocar.  All trocars were placed under direct visualization.  Laparoscopic scissors were used to lyse adhesions of the omentum from the anterior abdominal wall.  The bowels were moved into the upper abdomen.  The DaVinci robotic surgical system was brought to the patient's bedside and docked.  Pelvic washings were obtained.  The residual omental adhesion to the anterior abdominal wall was further inspected and noted to have some nodularity.  This was excised with cautery and removed through the assistant trocar for permanent pathology.  Next, extensive but filmy adhesions of the sigmoid colon overlying the left ovary were lysed to expose the left ovary adherent to the left  psoas.  Similarly, adhesions of the cecum and appendix were lysed from the right pelvic sidewall and right ovary to expose the right ovary.  With both ovaries  exposed the findings were noted as above.  The left ovary was further dissected from its adhesions from the left pelvic sidewall with blunt and sharp dissection with electrocautery.  The left ureter was identified transperitoneally.  The left retroperitoneum was entered and the left infundibulopelvic ligament was isolated, cauterized, and transected.  The specimen was placed into an Endo Catch bag and removed through the assistant trocar.  Additional adhesions of the sigmoid colon to the left pelvic sidewall were further lysed to mobilize the colon.  With frozen pathology returning benign, the pelvis was irrigated and all operative sites were found to be hemostatic.    All instruments were removed and the robot was taken from the patient's bedside. The fascia at the 12 mm incision was closed with 0 Vicryl with the assistance of a PMI device. The abdomen was desufflated and all ports were removed. The skin at all incisions was closed with 4-0 Vicryl to reapproximate the subcutaneous tissue and 4-0 monocryl in a subcuticular fashion followed by surgical glue.  Patient tolerated the procedure well. Sponge, lap, and instrument counts were correct.  No perioperative antibiotic prophylaxis was indicated for this procedure.  She was extubated and taken to the PACU in stable condition.  Clide Cliff, MD Gynecologic Oncology

## 2022-12-29 NOTE — Anesthesia Procedure Notes (Signed)
Procedure Name: Intubation Date/Time: 12/29/2022 8:17 AM  Performed by: Chinita Pester, CRNAPre-anesthesia Checklist: Patient identified, Emergency Drugs available, Suction available and Patient being monitored Patient Re-evaluated:Patient Re-evaluated prior to induction Oxygen Delivery Method: Circle System Utilized Preoxygenation: Pre-oxygenation with 100% oxygen Induction Type: IV induction Ventilation: Mask ventilation without difficulty Laryngoscope Size: Mac and 4 Grade View: Grade I Tube type: Oral Tube size: 7.0 mm Number of attempts: 1 Airway Equipment and Method: Stylet and Oral airway Placement Confirmation: ETT inserted through vocal cords under direct vision, positive ETCO2 and breath sounds checked- equal and bilateral Secured at: 21 cm Tube secured with: Tape Dental Injury: Teeth and Oropharynx as per pre-operative assessment

## 2022-12-29 NOTE — Anesthesia Postprocedure Evaluation (Signed)
Anesthesia Post Note  Patient: Shelbie D Seely  Procedure(s) Performed: XI ROBOTIC ASSISTED LEFT OOPHORECTOMY     Patient location during evaluation: PACU Anesthesia Type: General Level of consciousness: awake and alert Pain management: pain level controlled Vital Signs Assessment: post-procedure vital signs reviewed and stable Respiratory status: spontaneous breathing, nonlabored ventilation, respiratory function stable and patient connected to nasal cannula oxygen Cardiovascular status: blood pressure returned to baseline and stable Postop Assessment: no apparent nausea or vomiting Anesthetic complications: no  No notable events documented.  Last Vitals:  Vitals:   12/29/22 1200 12/29/22 1215  BP: (!) 119/95 110/73  Pulse: 74 (!) 56  Resp: 16 12  Temp:    SpO2: 100% 98%    Last Pain:  Vitals:   12/29/22 1215  TempSrc:   PainSc: Asleep                 Flossie Wexler L Conlin Brahm

## 2022-12-30 ENCOUNTER — Telehealth: Payer: Self-pay | Admitting: *Deleted

## 2022-12-30 ENCOUNTER — Encounter (HOSPITAL_COMMUNITY): Payer: Self-pay | Admitting: Psychiatry

## 2022-12-30 LAB — CYTOLOGY - NON PAP

## 2022-12-30 LAB — SURGICAL PATHOLOGY

## 2022-12-30 NOTE — Telephone Encounter (Signed)
Spoke with Alison Cervantes this morning. She states she is eating, drinking and urinating well. She has not had a BM yet but is passing gas. She is taking senokot as prescribed and encouraged her to drink plenty of water. She denies fever or chills. Incisions are dry and intact. She rates her pain 4/10. Her pain is controlled with oxycodone.     Instructed to call office with any fever, chills, purulent drainage, uncontrolled pain or any other questions or concerns. Patient verbalizes understanding.   Pt aware of post op appointments as well as the office number [phone] and after hours number [phone] to call if she has any questions or concerns

## 2023-01-03 ENCOUNTER — Other Ambulatory Visit: Payer: Self-pay | Admitting: Gynecologic Oncology

## 2023-01-03 DIAGNOSIS — N838 Other noninflammatory disorders of ovary, fallopian tube and broad ligament: Secondary | ICD-10-CM

## 2023-01-05 ENCOUNTER — Telehealth: Payer: Self-pay

## 2023-01-05 NOTE — Telephone Encounter (Signed)
Lvm for patient to call office regarding advice from Warner Mccreedy NP

## 2023-01-05 NOTE — Telephone Encounter (Signed)
Unable to reach Alison Cervantes regarding a refill request we received from her pharmacy on Oxycodone 5mg .  Mychart message sent as well as phone message.

## 2023-01-05 NOTE — Telephone Encounter (Signed)
Alison Cervantes returned call regarding the refill request for Oxycodone, received from her pharmacy.   S/P ROBOTIC ASSISTED LEFT OOPHORECTOMY  12/29/22 with Dr.Newton  Triage Pain protocol reviewed with patient: Pt reports pain is in abdomen (more on left side) she has been trying to do "household chores" keeping in mind the no lifting >10lbs restrictions. has to constantly sit down due to the pain. Reports eating/drinking, no fever/chills, normal BM's, no UTI S&S (pain, pressure, burning, frequency), no N/V. Pain 6/10 on pain scale,when she is moving. She has been taking the Ibuprofen 800mg  every 8 hours as well to help with pain. Ibuprofen helps a little.Has not taken Tylenol.  Pt reports she took her last Oxycodone on Sunday.  Pt aware of her post op visit with Dr. Alvester Morin on Monday 11/11.   Pt aware Dr.Newton is out of the office today, will send message and call her with advice.

## 2023-01-06 NOTE — Telephone Encounter (Signed)
Voicemail left for patient to call office regarding the request for pain medication and advice from Warner Mccreedy NP

## 2023-01-11 ENCOUNTER — Inpatient Hospital Stay: Payer: 59 | Attending: Psychiatry | Admitting: Psychiatry

## 2023-01-11 VITALS — BP 120/91 | HR 85 | Temp 99.7°F | Resp 20 | Wt 154.4 lb

## 2023-01-11 DIAGNOSIS — Z8541 Personal history of malignant neoplasm of cervix uteri: Secondary | ICD-10-CM | POA: Diagnosis not present

## 2023-01-11 DIAGNOSIS — Z90721 Acquired absence of ovaries, unilateral: Secondary | ICD-10-CM | POA: Insufficient documentation

## 2023-01-11 DIAGNOSIS — Z9079 Acquired absence of other genital organ(s): Secondary | ICD-10-CM | POA: Insufficient documentation

## 2023-01-11 DIAGNOSIS — Z7189 Other specified counseling: Secondary | ICD-10-CM

## 2023-01-11 DIAGNOSIS — Z90710 Acquired absence of both cervix and uterus: Secondary | ICD-10-CM | POA: Diagnosis not present

## 2023-01-11 DIAGNOSIS — C531 Malignant neoplasm of exocervix: Secondary | ICD-10-CM

## 2023-01-11 NOTE — Patient Instructions (Signed)
It was a pleasure to see you in clinic today. - Healing well. - No heavy lifting until 6 weeks postop. - Return visit planned for 3 months  Thank you very much for allowing me to provide care for you today.  I appreciate your confidence in choosing our Gynecologic Oncology team at [facility].  If you have any questions about your visit today please call our office or send [place] a MyChart message and we will get back to you as soon as possible.

## 2023-01-11 NOTE — Progress Notes (Unsigned)
Gynecologic Oncology Return Clinic Visit  Date of Service: 01/11/2023 Referring Provider: Derl Barrow, MD   Assessment & Plan: Alison Cervantes is a 42 y.o. 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   Postop: - Pt recovering well from surgery and healing appropriately postoperatively - Intraoperative findings and pathology results reviewed. - Ongoing postoperative expectations and precautions reviewed. Continue with no lifting >10lbs through 6 weeks postoperatively - Okay to return to work at 6 weeks postop.  SCC of cervix: - Pt was previously counseled on risk factors for recurrence that are somewhat indeterminate based on pathology combined from cone specimen and hysterectomy specimen. Following consultation with radiation oncology, pt has opted to forgo adjuvant treatment.  - Given negative for recurrence, continue recommended surveillance q65mo x2 years, then q93mo. - Plan annual pap. - Repeat PET in 80mo.  ***  ***VTE Prophylaxis: - Khorana score = ***  RTC ***.  Clide Cliff, MD Gynecologic Oncology   Medical Decision Making I personally spent  TOTAL *** minutes face-to-face and non-face-to-face in the care of this patient, which includes all pre, intra, and post visit time on the date of service. The discussion of *** is beyond the scope of routine postoperative care.    ----------------------- Reason for Visit: Postop/Treatment counseling  Treatment History: Oncology History  Malignant neoplasm of exocervix (HCC)  10/20/2021 Pathology Results   ASCUS, HPV16+   01/27/2022 Procedure   Colposcopy: Cervical biopsy at 5:00:  Invasive squamous cell carcinoma, HPV-associated Cervical biopsy 11:00: HSIL Cervical biopsy 12:00 H SIL ECC: HSIL   01/27/2022 Initial Diagnosis   Malignant neoplasm of exocervix (HCC)   02/24/2022 Surgery   [facility]   02/24/2022 Pathology Results   A. CERVIX, CONE:  - Invasive moderately differentiated squamous cell carcinoma associated  with extensive squamous cell carcinoma in situ  - Invasive carcinoma is 2 cm and invades to a depth of 0.6 cm (6 mm)  - Invasive carcinoma involves the ectocervical and deep margins in the  12-3 o'clock quadrant  - Carcinoma in situ involves the endocervical and deep margins in the  6-9 o'clock quadrant  - Focal lymphovascular space involvement  - See oncology table   B. ENDOCERVIX, CURETTAGE:  - Detached fragments of at least squamous cell carcinoma in situ  - Benign endocervical mucosa    03/23/2022 Imaging   PET: IMPRESSION: 1. Focal FDG uptake in the posterior cervix likely the site of reported neoplasm. 2. No signs of distant metastatic disease. 3. Signs of brown fat activation in the paraspinal regions and in the posterior mediastinum.  ADDENDUM: Revised impression: Cervical activity remains suspicious though in light of recent surgery is less specific for residual disease.   03/23/2022 Imaging   MRI Pelvis: IMPRESSION: Some susceptibility artifact in the area of the cervix may relate to recent surgical change. Mild irregularity at the 6:00 to 9:00 position in the lower cervix and anteriorly along the lower cervical margin without gross enhancing lesion, or signs of parametrial extension.   No adenopathy in the pelvis.   Above findings are limited however along the LEFT cervical margin due to susceptibility artifact from rectal gas. Would suggest the patient return for repeat imaging after restricting diet of large meals,  high-fiber and caffeine or carbonated beverages 24 hours prior to scanning.  ADDENDUM: Limitations are  as outlined previously. The previously reported "RIGHT posterolateral" abnormality is actually LEFT paramidline posterior cervix, nearly in the midline.   Intermediate T2 signal along the margin near the anterior cervical os is centered more in the midline. Bowel gas limits assessment on both diffusion and postcontrast imaging.     Interval History: Pt reports that she is recovering well from surgery. She is using ibuprofen prn for pain. She is eating and drinking well. She is voiding without issue and having regular bowel movements. Notes some bloating.   Past Medical/Surgical History: Past Medical History:  Diagnosis Date   ADHD (attention deficit hyperactivity disorder)    Asthma    hx, exercise induced, resolved   Cervical cancer (HCC)    Depression    Family history of breast cancer    Family history of colon cancer    Family history of melanoma    Family history of ovarian cancer    Family history of prostate cancer    Family history of uterine cancer    History of anemia    History of migraine    History of peptic ulcer disease    Psoriasis    Vitamin D deficiency    Wears glasses     Past Surgical History:  Procedure Laterality Date   CERVICAL CONIZATION W/BX N/A 02/24/2022   Procedure: CONIZATION CERVIX WITH BIOPSY;  Surgeon: Clide Cliff, MD;  Location: [facility];  Service: Gynecology;  Laterality: N/A;   NO PAST SURGERIES     RADICAL HYSTERECTOMY WITH TRANSPOSITION OF OVARIES  04/09/2022   Exploratory laparotomy, radical abdominal hysterectomy (Type C), bilateral salpingectomy, upper vaginectomy, bilateral sentinel lymph node evaluation and biopsy, bilateral ovarian transposition at [facility] with Dr. Alvester Morin   ROBOTIC ASSISTED SALPINGO OOPHERECTOMY N/A 12/29/2022   Procedure: XI ROBOTIC ASSISTED LEFT OOPHORECTOMY;  Surgeon: Clide Cliff, MD;  Location: [facility];  Service: Gynecology;  Laterality: N/A;    Family History  Problem Relation Age  of Onset   Hyperlipidemia Mother    Hypertension Mother    Melanoma Mother    Alcohol abuse Father    Heart disease Father    Hyperlipidemia Father    Hypertension Father    Diabetes Maternal Grandmother    Heart disease Maternal Grandmother    Hyperlipidemia Maternal Grandmother    Hypertension Maternal Grandmother    Breast cancer Maternal Grandmother    Hyperlipidemia Paternal Grandmother    Hypertension Paternal Grandmother    Leukemia Paternal Grandmother    Brain cancer Paternal Grandmother    Prostate cancer Paternal Grandfather    Diabetes Paternal Grandfather    Hyperlipidemia Paternal Grandfather    Hypertension Paternal Grandfather    Colon cancer Paternal Grandfather    Stomach cancer Paternal Grandfather    Breast cancer Maternal Aunt    Uterine cancer Maternal Aunt    Ovarian cancer Maternal Aunt    Uterine cancer Cousin 29       maternal first cousin   Breast cancer Cousin        maternal cousin with breast cancer in her 77s   Alcohol abuse Other        family hx   Breast cancer Other        1st degree family <50   Coronary artery disease Other        1st degree ><60   Diabetes Other  family hx   Hypertension Other        family hx   Lung cancer Other        family hx    Social History   Socioeconomic History   Marital status: Significant Other    Spouse name: Not on file   Number of children: Not on file   Years of education: Not on file   Highest education level: Not on file  Occupational History   Not on file  Tobacco Use   Smoking status: Never   Smokeless tobacco: Never  Vaping Use   Vaping status: Never Used  Substance and Sexual Activity   Alcohol use: Not Currently   Drug use: Not Currently    Comment: 02-20-2022  per pt smoked marijuana few months ago   Sexual activity: Not on file  Other Topics Concern   Not on file  Social History Narrative   Not on file   Social Determinants of Health   Financial Resource Strain:  Not on file  Food Insecurity: Not on file  Transportation Needs: No Transportation Needs (02/18/2022)   PRAPARE - Administrator, Civil Service (Medical): No    Lack of Transportation (Non-Medical): No  Physical Activity: Not on file  Stress: Not on file  Social Connections: Not on file    Current Medications:  Current Outpatient Medications:    amphetamine-dextroamphetamine (ADDERALL) 30 MG tablet, Take 1 tablet by mouth 2 (two) times daily., Disp: 60 tablet, Rfl: 0   b complex vitamins capsule, Take 1 capsule by mouth daily before lunch., Disp: , Rfl:    Cholecalciferol (VITAMIN D-3 PO), Take 1 capsule by mouth daily before lunch., Disp: , Rfl:    cyanocobalamin (VITAMIN B12) 1000 MCG tablet, Take 1,000 mcg by mouth daily before lunch., Disp: , Rfl:    hydrocortisone cream 1 %, Apply 1 Application topically 2 (two) times daily as needed (rash/skin irritation.)., Disp: , Rfl:    ibuprofen (ADVIL) 800 MG tablet, Take 1 tablet (800 mg total) by mouth every 8 (eight) hours as needed for moderate pain. For AFTER surgery only, Disp: 30 tablet, Rfl: 0   loratadine (CLARITIN) 10 MG tablet, Take 10 mg by mouth daily as needed (rash/allergies.)., Disp: , Rfl:    LORazepam (ATIVAN) 0.5 MG tablet, Take 1 tablet (0.5 mg total) by mouth 2 (two) times daily as needed for anxiety or sleep., Disp: 60 tablet, Rfl: 2   MAGNESIUM PO, Take 1 capsule by mouth at bedtime., Disp: , Rfl:    Multiple Vitamin (MULTIVITAMIN WITH MINERALS) TABS tablet, Take 1 tablet by mouth daily before lunch. Women's One A Day Multivitamin, Disp: , Rfl:    Multiple Vitamins-Minerals (ZINC PO), Take 1 capsule by mouth at bedtime., Disp: , Rfl:    Omega-3 Fatty Acids (FISH OIL PO), Take 1-2 capsules by mouth daily., Disp: , Rfl:    oxyCODONE (OXY IR/ROXICODONE) 5 MG immediate release tablet, Take 1 tablet (5 mg total) by mouth every 4 (four) hours as needed for severe pain. For AFTER surgery only, do not take and drive,  Disp: 15 tablet, Rfl: 0   senna-docusate (SENOKOT-S) 8.6-50 MG tablet, Take 2 tablets by mouth at bedtime. For AFTER surgery, do not take if having diarrhea, Disp: 30 tablet, Rfl: 0  Review of Symptoms: Complete 10-system review is for: Low-grade fever, abdominal pain, possible hot flashes  Physical Exam: BP (!) 120/91 (BP Location: Left Arm, Patient Position: Sitting)   Pulse 85  Temp 99.7 F (37.6 C) (Oral)   Resp 20   Wt 154 lb 6.4 oz (70 kg)   SpO2 100%   BMI 28.24 kg/m  General: Alert, oriented, no acute distress. HEENT: Normocephalic, atraumatic. Neck symmetric without masses. Sclera anicteric.  Chest: Normal work of breathing. Clear to auscultation bilaterally.   Cardiovascular: Regular rate and rhythm, no murmurs. Abdomen: Soft, nontender.  Normoactive bowel sounds.  No masses appreciated.  Well-healing incisions. Extremities: Grossly normal range of motion.  Warm, well perfused.  No edema bilaterally. Skin: No rashes or lesions noted.  Laboratory & Radiologic Studies: Surgical pathology (12/29/22): FINAL MICROSCOPIC DIAGNOSIS:   A. OVARY, LEFT, OOPHORECTOMY:  Benign follicular cysts.  Nodular stromal luteal hyperplasia.  Negative for endometriosis or malignancy.   B. OMENTAL NODULE, EXCISION:  Nodular fibrosis with mild associated chronic inflammation.  Negative for endometriosis or malignancy.   Cytology (12/29/22): FINAL MICROSCOPIC DIAGNOSIS:  - No malignant cells identified

## 2023-01-12 ENCOUNTER — Encounter: Payer: Self-pay | Admitting: Psychiatry

## 2023-01-13 ENCOUNTER — Telehealth: Payer: Self-pay

## 2023-01-13 NOTE — Telephone Encounter (Signed)
FMLA forms filled out and faxed to [facility].  Pt is aware

## 2023-01-25 ENCOUNTER — Ambulatory Visit: Payer: 59 | Admitting: Psychiatry

## 2023-03-12 ENCOUNTER — Other Ambulatory Visit: Payer: Self-pay | Admitting: Family Medicine

## 2023-03-12 NOTE — Telephone Encounter (Signed)
 Done

## 2023-03-14 ENCOUNTER — Encounter: Payer: Self-pay | Admitting: Family Medicine

## 2023-04-12 ENCOUNTER — Other Ambulatory Visit: Payer: Self-pay | Admitting: Psychiatry

## 2023-04-12 ENCOUNTER — Other Ambulatory Visit: Payer: Self-pay | Admitting: Family Medicine

## 2023-04-12 ENCOUNTER — Ambulatory Visit: Payer: 59 | Admitting: Psychiatry

## 2023-04-12 DIAGNOSIS — C531 Malignant neoplasm of exocervix: Secondary | ICD-10-CM

## 2023-04-12 NOTE — Progress Notes (Signed)
This encounter was created in error - please disregard (patient rescheduled).

## 2023-04-19 NOTE — Telephone Encounter (Signed)
Pt LOV was on 12/25/22 Last refill done on 12/25/22 Please advise

## 2023-05-07 ENCOUNTER — Ambulatory Visit (HOSPITAL_COMMUNITY)
Admission: RE | Admit: 2023-05-07 | Discharge: 2023-05-07 | Disposition: A | Discharge status: Home / Self Care | Payer: Self-pay | Source: Ambulatory Visit | Attending: Psychiatry | Admitting: Psychiatry

## 2023-05-07 DIAGNOSIS — C531 Malignant neoplasm of exocervix: Secondary | ICD-10-CM | POA: Diagnosis not present

## 2023-05-07 DIAGNOSIS — C539 Malignant neoplasm of cervix uteri, unspecified: Secondary | ICD-10-CM | POA: Diagnosis not present

## 2023-05-07 LAB — GLUCOSE, CAPILLARY: Glucose-Capillary: 105 mg/dL — ABNORMAL HIGH (ref 70–99)

## 2023-05-07 MED ORDER — FLUDEOXYGLUCOSE F - 18 (FDG) INJECTION
7.5000 | Freq: Once | INTRAVENOUS | Status: AC | PRN
  Administered 2023-05-07: 7.7 via INTRAVENOUS

## 2023-05-10 ENCOUNTER — Inpatient Hospital Stay: Payer: Self-pay | Attending: Psychiatry | Admitting: Psychiatry

## 2023-05-10 ENCOUNTER — Encounter: Payer: Self-pay | Admitting: Psychiatry

## 2023-05-10 VITALS — BP 124/62 | HR 96 | Temp 99.5°F | Resp 19 | Wt 157.0 lb

## 2023-05-10 DIAGNOSIS — R350 Frequency of micturition: Secondary | ICD-10-CM | POA: Insufficient documentation

## 2023-05-10 DIAGNOSIS — Z90710 Acquired absence of both cervix and uterus: Secondary | ICD-10-CM | POA: Diagnosis not present

## 2023-05-10 DIAGNOSIS — N838 Other noninflammatory disorders of ovary, fallopian tube and broad ligament: Secondary | ICD-10-CM

## 2023-05-10 DIAGNOSIS — Z9079 Acquired absence of other genital organ(s): Secondary | ICD-10-CM | POA: Insufficient documentation

## 2023-05-10 DIAGNOSIS — Z8541 Personal history of malignant neoplasm of cervix uteri: Secondary | ICD-10-CM | POA: Insufficient documentation

## 2023-05-10 DIAGNOSIS — Z90721 Acquired absence of ovaries, unilateral: Secondary | ICD-10-CM | POA: Insufficient documentation

## 2023-05-10 DIAGNOSIS — C531 Malignant neoplasm of exocervix: Secondary | ICD-10-CM

## 2023-05-10 NOTE — Progress Notes (Signed)
 Gynecologic Oncology Return Clinic Visit  Date of Service: 05/10/2023 Referring Provider: Derl Barrow, MD   Assessment & Plan: Alison Cervantes is a 43 y.o. 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 Presents today for surveillance.  SCC of cervix: - Pt was previously counseled on risk factors for recurrence that are somewhat indeterminate based on pathology combined from cone specimen and hysterectomy specimen. Following consultation with radiation oncology, pt has opted to forgo adjuvant treatment.  - NED on exam today - Signs/symptoms of recurrence reviewed. - Continue recommended surveillance q84mo x2 years, then q28mo. - Annual pap collected today.  - PET 05/07/23 in process, will follow-up results. Next PET in 1 year if negative. - Reviewed that after 5 years NED, will be safe to return to Ob/Gyn.  Urinary frequency - Precedes surgery for cervical cancer - Discussed bladder diary, referral to urogyn - She would like to start with a bladder diary. Hat and instructions provided.   RTC 3 months.  Clide Cliff, MD Gynecologic Oncology   Medical Decision Making I personally spent  TOTAL 30 minutes face-to-face and non-face-to-face in the care of this patient, which includes all pre, intra, and post visit time on the date of service.    ----------------------- Reason for Visit: Surveillance  Treatment History: Oncology History  Malignant neoplasm of exocervix (HCC)  10/20/2021 Pathology Results   ASCUS, HPV16+   01/27/2022 Procedure   Colposcopy: Cervical biopsy at 5:00: Invasive squamous cell carcinoma, HPV-associated Cervical biopsy 11:00:  HSIL Cervical biopsy 12:00 H SIL ECC: HSIL   01/27/2022 Initial Diagnosis   Malignant neoplasm of exocervix (HCC)   02/24/2022 Surgery   [facility]   02/24/2022 Pathology Results   A. CERVIX, CONE:  - Invasive moderately differentiated squamous cell carcinoma associated  with extensive squamous cell carcinoma in situ  - Invasive carcinoma is 2 cm and invades to a depth of 0.6 cm (6 mm)  - Invasive carcinoma involves the ectocervical and deep margins in the  12-3 o'clock quadrant  - Carcinoma in situ involves the endocervical and deep margins in the  6-9 o'clock quadrant  - Focal lymphovascular space involvement  - See oncology table   B. ENDOCERVIX, CURETTAGE:  - Detached fragments of at least squamous cell carcinoma in situ  - Benign endocervical mucosa    03/23/2022 Imaging   PET: IMPRESSION: 1. Focal FDG uptake in the posterior cervix likely the site of reported neoplasm. 2. No signs of distant metastatic disease. 3. Signs of brown fat activation in the paraspinal regions and in the posterior mediastinum.  ADDENDUM: Revised impression: Cervical activity remains suspicious though in light of recent surgery is less specific for residual disease.   03/23/2022 Imaging   MRI Pelvis: IMPRESSION: Some susceptibility artifact in the area of the cervix may relate to recent surgical change. Mild irregularity at the 6:00 to 9:00 position in the lower cervix and anteriorly along the lower cervical margin without gross enhancing lesion, or signs of parametrial extension.   No adenopathy in the pelvis.   Above findings are limited however along the LEFT cervical margin due to susceptibility artifact from rectal gas. Would suggest the patient return for repeat imaging after restricting diet of large meals, high-fiber and caffeine or  carbonated beverages 24 hours prior to scanning.  ADDENDUM: Limitations are as outlined previously. The previously reported "RIGHT posterolateral"  abnormality is actually LEFT paramidline posterior cervix, nearly in the midline.   Intermediate T2 signal along the margin near the anterior cervical os is centered more in the midline. Bowel gas limits assessment on both diffusion and postcontrast imaging.     Interval History: Patient reports she is overall doing well.  She denies vaginal bleeding, issues with her bowels, bloating, unintentional weight loss, nausea or vomiting.  She does note some urinary frequency which precedes her surgery.  Denies dysuria.  Feels that she may drink water and coffee often that may be contributing.  More recently in the past 3 to 5 days she notes some pain from her shoulder neck area on the right that is rating down to her arm with some numbness in her arm and hand.  This happens more at night and she is curious if it has to do with something how she slept.  She has used heat and ibuprofen as well as 1 dose of a muscle relaxer she had leftover from a back issue.  Since the muscle relaxer medication she seen some improvement and did not have any issues last night.   Past Medical/Surgical History: Past Medical History:  Diagnosis Date   ADHD (attention deficit hyperactivity disorder)    Asthma    hx, exercise induced, resolved   Cervical cancer (HCC)    Depression    Family history of breast cancer    Family history of colon cancer    Family history of melanoma    Family history of ovarian cancer    Family history of prostate cancer    Family history of uterine cancer    History of anemia    History of migraine    History of peptic ulcer disease    Psoriasis    Vitamin D deficiency    Wears glasses     Past Surgical History:  Procedure Laterality Date   CERVICAL CONIZATION W/BX N/A 02/24/2022   Procedure: CONIZATION CERVIX WITH BIOPSY;  Surgeon: Clide Cliff, MD;  Location: [facility];  Service: Gynecology;  Laterality: N/A;   NO PAST SURGERIES     RADICAL HYSTERECTOMY  WITH TRANSPOSITION OF OVARIES  04/09/2022   Exploratory laparotomy, radical abdominal hysterectomy (Type C), bilateral salpingectomy, upper vaginectomy, bilateral sentinel lymph node evaluation and biopsy, bilateral ovarian transposition at [facility] with Dr. Alvester Morin   ROBOTIC ASSISTED SALPINGO OOPHERECTOMY N/A 12/29/2022   Procedure: XI ROBOTIC ASSISTED LEFT OOPHORECTOMY;  Surgeon: Clide Cliff, MD;  Location: [facility];  Service: Gynecology;  Laterality: N/A;    Family History  Problem Relation Age of Onset   Hyperlipidemia Mother    Hypertension Mother    Melanoma Mother    Alcohol abuse Father    Heart disease Father    Hyperlipidemia Father    Hypertension Father    Diabetes Maternal Grandmother    Heart disease Maternal Grandmother    Hyperlipidemia Maternal Grandmother    Hypertension Maternal Grandmother    Breast cancer Maternal Grandmother    Hyperlipidemia Paternal Grandmother    Hypertension Paternal Grandmother    Leukemia Paternal Grandmother    Brain cancer Paternal Grandmother    Prostate cancer Paternal Grandfather    Diabetes Paternal Grandfather    Hyperlipidemia Paternal Grandfather    Hypertension Paternal Grandfather    Colon cancer Paternal Grandfather    Stomach cancer Paternal Grandfather  Breast cancer Maternal Aunt    Uterine cancer Maternal Aunt    Ovarian cancer Maternal Aunt    Uterine cancer Cousin 56       maternal first cousin   Breast cancer Cousin        maternal cousin with breast cancer in her 13s   Alcohol abuse Other        family hx   Breast cancer Other        1st degree family <50   Coronary artery disease Other        1st degree ><60   Diabetes Other        family hx   Hypertension Other        family hx   Lung cancer Other        family hx    Social History   Socioeconomic History   Marital status: Significant Other    Spouse name: Not on file   Number of children: Not on file   Years of education: Not on file   Highest  education level: Not on file  Occupational History   Not on file  Tobacco Use   Smoking status: Never   Smokeless tobacco: Never  Vaping Use   Vaping status: Never Used  Substance and Sexual Activity   Alcohol use: Not Currently   Drug use: Not Currently    Comment: 02-20-2022  per pt smoked marijuana few months ago   Sexual activity: Not on file  Other Topics Concern   Not on file  Social History Narrative   Not on file   Social Drivers of Health   Financial Resource Strain: Not on file  Food Insecurity: Not on file  Transportation Needs: No Transportation Needs (02/18/2022)   PRAPARE - Administrator, Civil Service (Medical): No    Lack of Transportation (Non-Medical): No  Physical Activity: Not on file  Stress: Not on file  Social Connections: Not on file    Current Medications:  Current Outpatient Medications:    amphetamine-dextroamphetamine (ADDERALL) 30 MG tablet, Take 1 tablet by mouth 2 (two) times daily., Disp: 60 tablet, Rfl: 0   b complex vitamins capsule, Take 1 capsule by mouth daily before lunch., Disp: , Rfl:    Cholecalciferol (VITAMIN D-3 PO), Take 1 capsule by mouth daily before lunch., Disp: , Rfl:    hydrocortisone cream 1 %, Apply 1 Application topically 2 (two) times daily as needed (rash/skin irritation.)., Disp: , Rfl:    LORazepam (ATIVAN) 0.5 MG tablet, TAKE 1 TABLET BY MOUTH 2 TIMES A DAY AS NEEDED FOR ANXIETY OR SLEEP, Disp: 60 tablet, Rfl: 5   MAGNESIUM PO, Take 1 capsule by mouth at bedtime., Disp: , Rfl:    Multiple Vitamin (MULTIVITAMIN WITH MINERALS) TABS tablet, Take 1 tablet by mouth daily before lunch. Women's One A Day Multivitamin, Disp: , Rfl:    Multiple Vitamins-Minerals (ZINC PO), Take 1 capsule by mouth at bedtime., Disp: , Rfl:    Omega-3 Fatty Acids (FISH OIL PO), Take 1-2 capsules by mouth daily., Disp: , Rfl:   Review of Symptoms: Complete 10-system review is for: Urinary frequency, back pain, right shoulder/neck  pain that radiates to arm  Physical Exam: BP 124/62 (BP Location: Left Arm, Patient Position: Sitting)   Pulse 96   Temp 99.5 F (37.5 C) (Oral)   Resp 19   Wt 157 lb (71.2 kg)   SpO2 100%   BMI 28.72 kg/m  General: Alert, oriented, no  acute distress. HEENT: Normocephalic, atraumatic. Neck symmetric without masses. Sclera anicteric.  Chest: Normal work of breathing. Clear to auscultation bilaterally.   Cardiovascular: Regular rate and rhythm, no murmurs. Abdomen: Soft, nontender.  Normoactive bowel sounds.  No masses or hepatosplenomegaly appreciated.  Well-healed scar. Extremities: Grossly normal range of motion.  Warm, well perfused.  No edema bilaterally. Skin: No rashes or lesions noted. Lymphatics: No cervical, supraclavicular, or inguinal adenopathy. GU: Normal appearing external genitalia without erythema, excoriation, or lesions.  Speculum exam reveals normal vaginal mucosa and cuff, no lesions.  Bimanual exam reveals smooth cuff. No nodularity or pelvic mass.  Exam chaperoned by Kimberly [place], CMA   Laboratory & Radiologic Studies: PET scan pending

## 2023-05-10 NOTE — Patient Instructions (Addendum)
 It was a pleasure to see you in clinic today. - We collected a pap smear today - We will follow-up your pet results - For the bladder diary, for 1 week, keep track of what you drink, how much, and when. Also record how much you pee and when. - Return visit planned for 3 months  Thank you very much for allowing me to provide care for you today.  I appreciate your confidence in choosing our Gynecologic Oncology team at [facility].  If you have any questions about your visit today please call our office or send [place] a MyChart message and we will get back to you as soon as possible.

## 2023-05-10 NOTE — Addendum Note (Signed)
 Addended by: Warner Mccreedy D on: 05/10/2023 04:10 PM   Modules accepted: Orders

## 2023-05-17 ENCOUNTER — Telehealth: Payer: Self-pay

## 2023-05-17 LAB — CYTOLOGY - PAP
Comment: NEGATIVE
Diagnosis: NEGATIVE
High risk HPV: NEGATIVE

## 2023-05-17 NOTE — Telephone Encounter (Signed)
 LVM for patient to return call regarding recent pap smear result as reported by Warner Mccreedy NP.

## 2023-05-17 NOTE — Telephone Encounter (Signed)
-----   Message from Doylene Bode sent at 05/17/2023  3:10 PM EDT ----- Please let her know her pap smear is negative for precancer or cancer. HPV high risk is negative. Good news. ----- Message ----- From: Interface, Lab In Three Zero One Sent: 05/17/2023   3:04 PM EDT To: Doylene Bode, NP

## 2023-05-18 NOTE — Telephone Encounter (Signed)
 LVM again for patient to return call regarding recent pap smear results

## 2023-05-19 NOTE — Telephone Encounter (Signed)
 Mychart message sent with message from Warner Mccreedy NP regarding pap smear results.

## 2023-05-24 ENCOUNTER — Encounter: Payer: Self-pay | Admitting: Psychiatry

## 2023-06-09 ENCOUNTER — Other Ambulatory Visit: Payer: Self-pay | Admitting: Family Medicine

## 2023-06-10 ENCOUNTER — Other Ambulatory Visit: Payer: Self-pay | Admitting: Family Medicine

## 2023-06-10 NOTE — Telephone Encounter (Signed)
 Copied from CRM [id number]. Topic: Clinical - Medication Refill >> Jun 10, 2023  2:31 PM Godfrey Pick wrote: Most Recent Primary Care Visit:  Provider: Gershon Crane A  Department: [facility]  Visit Type: PHYSICAL  Date: 12/25/2022  Medication: amphetamine-dextroamphetamine (ADDERALL) 30 MG tablet  Has the patient contacted their pharmacy? Yes (Agent: If no, request that the patient contact the pharmacy for the refill. If patient does not wish to contact the pharmacy document the reason why and proceed with request.) (Agent: If yes, when and what did the pharmacy advise?)  Is this the correct pharmacy for this prescription? Yes If no, delete pharmacy and type the correct one.  This is the patient's preferred pharmacy:  [facility] - [place], [place] - [address] [place] [address] Phone: [phone] Fax: [phone]  [facility] [phone] - [address] [place] - [address] [place] [address] Phone: [phone] Fax: [phone]   Has the prescription been filled recently? No  Is the patient out of the medication? Yes  Has the patient been seen for an appointment in the last year OR does the patient have an upcoming appointment? Yes  Can we respond through MyChart? Yes  Agent: Please be advised that Rx refills may take up to 3 business days. We ask that you follow-up with your pharmacy.

## 2023-06-11 ENCOUNTER — Encounter: Payer: Self-pay | Admitting: Family Medicine

## 2023-06-11 NOTE — Telephone Encounter (Signed)
 Done

## 2023-06-11 NOTE — Telephone Encounter (Signed)
 Already done, close pt chart

## 2023-06-14 NOTE — Telephone Encounter (Signed)
 Done

## 2023-08-09 ENCOUNTER — Telehealth: Payer: Self-pay | Admitting: *Deleted

## 2023-08-09 ENCOUNTER — Ambulatory Visit: Payer: No Typology Code available for payment source | Admitting: Psychiatry

## 2023-08-09 DIAGNOSIS — C531 Malignant neoplasm of exocervix: Secondary | ICD-10-CM

## 2023-08-09 NOTE — Telephone Encounter (Signed)
 Spoke with Alison Cervantes who called the office to reschedule her appointment today with Dr. Daisey Dryer because she is not feeling well. Patient was given a new appointment for Monday, July 21 st at 3:15 pm. Pt agreed to date and time.

## 2023-08-09 NOTE — Progress Notes (Unsigned)
 This encounter was created in error - please disregard.

## 2023-09-10 ENCOUNTER — Other Ambulatory Visit: Payer: Self-pay | Admitting: Family Medicine

## 2023-09-10 NOTE — Telephone Encounter (Signed)
 Done

## 2023-09-17 ENCOUNTER — Encounter: Payer: Self-pay | Admitting: Psychiatry

## 2023-09-20 ENCOUNTER — Encounter: Payer: Self-pay | Admitting: Psychiatry

## 2023-09-20 ENCOUNTER — Inpatient Hospital Stay: Payer: Self-pay | Attending: Psychiatry | Admitting: Psychiatry

## 2023-09-20 VITALS — BP 115/73 | HR 87 | Temp 99.1°F | Resp 18 | Wt 142.8 lb

## 2023-09-20 DIAGNOSIS — C531 Malignant neoplasm of exocervix: Secondary | ICD-10-CM | POA: Diagnosis not present

## 2023-09-20 NOTE — Patient Instructions (Signed)
 It was a pleasure to see you in clinic today. - Normal exam today - Return visit planned for 3 months. Will order next set of scans at next visit  Thank you very much for allowing me to provide care for you today.  I appreciate your confidence in choosing our Gynecologic Oncology team at [facility].  If you have any questions about your visit today please call our office or send us  a MyChart message and we will get back to you as soon as possible.

## 2023-09-20 NOTE — Progress Notes (Signed)
 Gynecologic Oncology Return Clinic Visit  Date of Service: 09/20/2023 Referring Provider: Rosaline Chapel, MD   Assessment & Plan: Alison Cervantes is a 43 y.o. 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 Presents today for surveillance.  SCC of cervix: - Pt was previously counseled on risk factors for recurrence that are somewhat indeterminate based on pathology combined from cone specimen and hysterectomy specimen. Following consultation with radiation oncology, pt has opted to forgo adjuvant treatment.  - NED on exam today - Signs/symptoms of recurrence reviewed. - Continue recommended surveillance q22mo x2 years, then q93mo. - Pap on 05/10/23 NILM. Continue annual pap. - PET 05/07/23 NED. Next PET 1 year follow-up. - Reviewed that after 5 years NED, will be safe to return to Ob/Gyn.  Urinary frequency - Precedes surgery for cervical cancer - Overall improving, continue to monitor  RTC 3 months.  Hoy Masters, MD Gynecologic Oncology   Medical Decision Making I personally spent  TOTAL 15 minutes face-to-face and non-face-to-face in the care of this patient, which includes all pre, intra, and post visit time on the date of service.    ----------------------- Reason for Visit: Surveillance  Treatment History: Oncology History  Malignant neoplasm of exocervix (HCC)  10/20/2021 Pathology Results   ASCUS, HPV16+   01/27/2022 Procedure   Colposcopy: Cervical biopsy at 5:00: Invasive squamous cell carcinoma, HPV-associated Cervical biopsy 11:00: HSIL Cervical biopsy 12:00 H SIL ECC: HSIL   01/27/2022 Initial Diagnosis   Malignant neoplasm of  exocervix (HCC)   02/24/2022 Surgery   [facility]   02/24/2022 Pathology Results   A. CERVIX, CONE:  - Invasive moderately differentiated squamous cell carcinoma associated  with extensive squamous cell carcinoma in situ  - Invasive carcinoma is 2 cm and invades to a depth of 0.6 cm (6 mm)  - Invasive carcinoma involves the ectocervical and deep margins in the  12-3 o'clock quadrant  - Carcinoma in situ involves the endocervical and deep margins in the  6-9 o'clock quadrant  - Focal lymphovascular space involvement  - See oncology table   B. ENDOCERVIX, CURETTAGE:  - Detached fragments of at least squamous cell carcinoma in situ  - Benign endocervical mucosa    03/23/2022 Imaging   PET: IMPRESSION: 1. Focal FDG uptake in the posterior cervix likely the site of reported neoplasm. 2. No signs of distant metastatic disease. 3. Signs of brown fat activation in the paraspinal regions and in the posterior mediastinum.  ADDENDUM: Revised impression: Cervical activity remains suspicious though in light of recent surgery is less specific for residual disease.   03/23/2022 Imaging   MRI Pelvis: IMPRESSION: Some susceptibility artifact in the area of the cervix may relate to recent surgical change. Mild irregularity at the 6:00 to 9:00 position in the lower cervix and anteriorly along the lower cervical margin without gross enhancing lesion, or signs of parametrial extension.   No adenopathy in the pelvis.   Above findings are limited however along the LEFT cervical margin due to susceptibility artifact from rectal gas. Would suggest the patient return for repeat imaging after restricting diet of large meals, high-fiber and caffeine or carbonated beverages 24 hours prior to scanning.  ADDENDUM: Limitations are as outlined previously. The previously reported RIGHT posterolateral abnormality  is actually LEFT paramidline posterior cervix, nearly in the midline.   Intermediate T2  signal along the margin near the anterior cervical os is centered more in the midline. Bowel gas limits assessment on both diffusion and postcontrast imaging.     Interval History: Overall had been doing well.  However, woke up this morning not feeling great.  Feels that she has had a low-grade temperature and fatigue.  Has not been around any sick contacts but does report eating out yesterday. otherwise notes that her urinary frequency is still present but improving. No new vaginal bleeding, abdominal/pelvic pain, unintentional weight loss, change in bowel or bladder habits, early satiety, bloating, nausea/vomiting.     Past Medical/Surgical History: Past Medical History:  Diagnosis Date   ADHD (attention deficit hyperactivity disorder)    Asthma    hx, exercise induced, resolved   Cervical cancer (HCC)    Depression    Family history of breast cancer    Family history of colon cancer    Family history of melanoma    Family history of ovarian cancer    Family history of prostate cancer    Family history of uterine cancer    History of anemia    History of migraine    History of peptic ulcer disease    Psoriasis    Vitamin D  deficiency    Wears glasses     Past Surgical History:  Procedure Laterality Date   CERVICAL CONIZATION W/BX N/A 02/24/2022   Procedure: CONIZATION CERVIX WITH BIOPSY;  Surgeon: Eldonna Mays, MD;  Location: [facility];  Service: Gynecology;  Laterality: N/A;   NO PAST SURGERIES     RADICAL HYSTERECTOMY WITH TRANSPOSITION OF OVARIES  04/09/2022   Exploratory laparotomy, radical abdominal hysterectomy (Type C), bilateral salpingectomy, upper vaginectomy, bilateral sentinel lymph node evaluation and biopsy, bilateral ovarian transposition at [facility] with Dr. Eldonna   ROBOTIC ASSISTED SALPINGO OOPHERECTOMY N/A 12/29/2022   Procedure: XI ROBOTIC ASSISTED LEFT OOPHORECTOMY;  Surgeon: Eldonna Mays, MD;  Location: [facility];  Service: Gynecology;   Laterality: N/A;    Family History  Problem Relation Age of Onset   Hyperlipidemia Mother    Hypertension Mother    Melanoma Mother    Alcohol abuse Father    Heart disease Father    Hyperlipidemia Father    Hypertension Father    Diabetes Maternal Grandmother    Heart disease Maternal Grandmother    Hyperlipidemia Maternal Grandmother    Hypertension Maternal Grandmother    Breast cancer Maternal Grandmother    Hyperlipidemia Paternal Grandmother    Hypertension Paternal Grandmother    Leukemia Paternal Grandmother    Brain cancer Paternal Grandmother    Prostate cancer Paternal Grandfather    Diabetes Paternal Grandfather    Hyperlipidemia Paternal Grandfather    Hypertension Paternal Grandfather    Colon cancer Paternal Grandfather    Stomach cancer Paternal Grandfather    Breast cancer Maternal Aunt    Uterine cancer Maternal Aunt    Ovarian cancer Maternal Aunt    Uterine cancer Cousin 78       maternal first cousin   Breast cancer Cousin        maternal cousin with breast cancer in her 97s   Alcohol abuse Other        family hx   Breast cancer Other        1st degree family <50   Coronary artery disease Other        1st  degree ><60   Diabetes Other        family hx   Hypertension Other        family hx   Lung cancer Other        family hx    Social History   Socioeconomic History   Marital status: Significant Other    Spouse name: Not on file   Number of children: Not on file   Years of education: Not on file   Highest education level: Not on file  Occupational History   Not on file  Tobacco Use   Smoking status: Never   Smokeless tobacco: Never  Vaping Use   Vaping status: Never Used  Substance and Sexual Activity   Alcohol use: Not Currently   Drug use: Not Currently    Comment: 02-20-2022  per pt smoked marijuana few months ago   Sexual activity: Not on file  Other Topics Concern   Not on file  Social History Narrative   Not on file    Social Drivers of Health   Financial Resource Strain: Not on file  Food Insecurity: Not on file  Transportation Needs: No Transportation Needs (02/18/2022)   PRAPARE - Administrator, Civil Service (Medical): No    Lack of Transportation (Non-Medical): No  Physical Activity: Not on file  Stress: Not on file  Social Connections: Not on file    Current Medications:  Current Outpatient Medications:    amphetamine -dextroamphetamine  (ADDERALL) 30 MG tablet, TAKE 1 TABLET BY MOUTH 2 (TWO) TIMES DAILY. FILL ON/AFTER 08/11/2023, Disp: 60 tablet, Rfl: 0   amphetamine -dextroamphetamine  (ADDERALL) 30 MG tablet, Take 1 tablet by mouth 2 (two) times daily., Disp: 60 tablet, Rfl: 0   b complex vitamins capsule, Take 1 capsule by mouth daily before lunch., Disp: , Rfl:    Cholecalciferol (VITAMIN D -3 PO), Take 1 capsule by mouth daily before lunch., Disp: , Rfl:    hydrocortisone  cream 1 %, Apply 1 Application topically 2 (two) times daily as needed (rash/skin irritation.)., Disp: , Rfl:    LORazepam  (ATIVAN ) 0.5 MG tablet, TAKE 1 TABLET BY MOUTH 2 TIMES A DAY AS NEEDED FOR ANXIETY OR SLEEP, Disp: 60 tablet, Rfl: 5   Multiple Vitamin (MULTIVITAMIN WITH MINERALS) TABS tablet, Take 1 tablet by mouth daily before lunch. Women's One A Day Multivitamin, Disp: , Rfl:    Multiple Vitamins-Minerals (ZINC PO), Take 1 capsule by mouth at bedtime., Disp: , Rfl:    Omega-3 Fatty Acids (FISH OIL PO), Take 1-2 capsules by mouth daily., Disp: , Rfl:    amphetamine -dextroamphetamine  (ADDERALL) 30 MG tablet, Take 1 tablet by mouth 2 (two) times daily., Disp: 60 tablet, Rfl: 0  Review of Symptoms: Complete 10-system review is for: Easy bruising, low grade fever, fatigue, depression  Physical Exam: BP 115/73 (BP Location: Left Arm, Patient Position: Sitting)   Pulse 87   Temp 99.1 F (37.3 C) (Oral)   Resp 18   Wt 142 lb 12.8 oz (64.8 kg)   SpO2 100%   BMI 26.12 kg/m  General: Alert, oriented,  no acute distress. HEENT: Normocephalic, atraumatic. Neck symmetric without masses. Sclera anicteric.  Chest: Normal work of breathing. Clear to auscultation bilaterally.   Cardiovascular: Regular rate and rhythm, no murmurs. Abdomen: Soft, nontender.  Normoactive bowel sounds.  No masses or hepatosplenomegaly appreciated.  Well-healed scar. Extremities: Grossly normal range of motion.  Warm, well perfused.  No edema bilaterally. Skin: No rashes or lesions noted. Lymphatics: No cervical, supraclavicular,  or inguinal adenopathy. GU: Normal appearing external genitalia without erythema, excoriation, or lesions.  Speculum exam reveals normal vaginal mucosa and cuff, no lesions.  Bimanual exam reveals smooth cuff. No nodularity or pelvic mass.  Exam chaperoned by Eleanor Epps, NP  Laboratory & Radiologic Studies: NM PET Image Restage (PS) Skull Base to Thigh (F-18 FDG) 05/07/2023  Narrative CLINICAL DATA:  Subsequent treatment strategy for cervical cancer.  EXAM: NUCLEAR MEDICINE PET SKULL BASE TO THIGH  TECHNIQUE: 7.7 mCi F-18 FDG was injected intravenously. Full-ring PET imaging was performed from the skull base to thigh after the radiotracer. CT data was obtained and used for attenuation correction and anatomic localization.  Fasting blood glucose: 105 mg/dl  COMPARISON:  90/94/7975  FINDINGS: Mediastinal blood pool activity: SUV max 2.13  Liver activity: SUV max NA  NECK: No hypermetabolic lymph nodes in the neck.  Incidental CT findings: None.  CHEST: No hypermetabolic mediastinal or hilar nodes. No suspicious pulmonary nodules on the CT scan.  No hypermetabolic breast masses, supraclavicular or axillary adenopathy.  Incidental CT findings: None.  ABDOMEN/PELVIS: No abnormal hypermetabolic activity within the liver, pancreas, adrenal glands, or spleen. No hypermetabolic lymph nodes in the abdomen or pelvis.  The hypermetabolic left ovary seen on prior PET-CT has  been surgically removed.  Incidental CT findings: None.  SKELETON: No focal hypermetabolic activity to suggest skeletal metastasis.  Incidental CT findings: None.  IMPRESSION: No findings for recurrent or metastatic disease involving the neck, chest, abdomen/pelvis or bony structures.   Electronically Signed By: MYRTIS Stammer M.D. On: 05/24/2023 10:22

## 2023-12-08 ENCOUNTER — Other Ambulatory Visit: Payer: Self-pay | Admitting: Family Medicine

## 2023-12-09 ENCOUNTER — Other Ambulatory Visit: Payer: Self-pay | Admitting: Family Medicine

## 2023-12-09 MED ORDER — AMPHETAMINE-DEXTROAMPHETAMINE 30 MG PO TABS: 30.0000 mg | ORAL_TABLET | Freq: Two times a day (BID) | ORAL | 0 refills | Status: DC

## 2023-12-09 MED ORDER — LORAZEPAM 0.5 MG PO TABS: 0.5000 mg | ORAL_TABLET | Freq: Two times a day (BID) | ORAL | 5 refills | Status: AC

## 2023-12-09 NOTE — Telephone Encounter (Signed)
 Done

## 2023-12-27 ENCOUNTER — Inpatient Hospital Stay: Attending: Psychiatry | Admitting: Psychiatry

## 2023-12-27 DIAGNOSIS — C531 Malignant neoplasm of exocervix: Secondary | ICD-10-CM

## 2023-12-27 NOTE — Progress Notes (Unsigned)
This encounter was created in error - please disregard.no show

## 2024-03-10 ENCOUNTER — Encounter: Admitting: Family Medicine

## 2024-03-13 ENCOUNTER — Other Ambulatory Visit: Payer: Self-pay | Admitting: Family Medicine

## 2024-03-13 NOTE — Telephone Encounter (Signed)
 Done

## 2024-03-14 ENCOUNTER — Encounter: Admitting: Family Medicine

## 2024-03-24 ENCOUNTER — Encounter: Admitting: Family Medicine

## 2024-04-03 ENCOUNTER — Encounter: Admitting: Family Medicine

## 2024-04-04 ENCOUNTER — Encounter: Admitting: Family Medicine

## 2024-04-12 ENCOUNTER — Encounter: Admitting: Family Medicine
